# Patient Record
Sex: Female | Born: 1985
Health system: Southern US, Community
[De-identification: ages and names within clinical notes are randomized; demographics above are authoritative.]

## PROBLEM LIST (undated history)

## (undated) DIAGNOSIS — F4310 Post-traumatic stress disorder, unspecified: Secondary | ICD-10-CM

## (undated) DIAGNOSIS — J40 Bronchitis, not specified as acute or chronic: Secondary | ICD-10-CM

## (undated) DIAGNOSIS — F32A Depression, unspecified: Secondary | ICD-10-CM

## (undated) DIAGNOSIS — J45909 Unspecified asthma, uncomplicated: Secondary | ICD-10-CM

## (undated) DIAGNOSIS — D219 Benign neoplasm of connective and other soft tissue, unspecified: Secondary | ICD-10-CM

## (undated) HISTORY — DX: Depression, unspecified: F32.A

## (undated) HISTORY — PX: NO PAST SURGERIES: SHX2092

## (undated) HISTORY — DX: Post-traumatic stress disorder, unspecified: F43.10

---

## 2018-11-09 ENCOUNTER — Other Ambulatory Visit: Payer: Self-pay

## 2018-11-09 ENCOUNTER — Emergency Department (HOSPITAL_COMMUNITY): Payer: Self-pay

## 2018-11-09 ENCOUNTER — Encounter (HOSPITAL_COMMUNITY): Payer: Self-pay

## 2018-11-09 ENCOUNTER — Emergency Department (HOSPITAL_COMMUNITY)
Admission: EM | Admit: 2018-11-09 | Discharge: 2018-11-10 | Disposition: A | Discharge status: Home / Self Care | Payer: Self-pay | Attending: Emergency Medicine | Admitting: Emergency Medicine

## 2018-11-09 DIAGNOSIS — M79662 Pain in left lower leg: Secondary | ICD-10-CM | POA: Insufficient documentation

## 2018-11-09 DIAGNOSIS — M79661 Pain in right lower leg: Secondary | ICD-10-CM | POA: Insufficient documentation

## 2018-11-09 DIAGNOSIS — R609 Edema, unspecified: Secondary | ICD-10-CM | POA: Insufficient documentation

## 2018-11-09 NOTE — ED Provider Notes (Signed)
[facility] DEPT Provider Note   CSN: [id number] Arrival date & time: 11/09/18  2114    History   Chief Complaint Chief Complaint  Patient presents with  . Leg Swelling    HPI Judy Hanna is a 33 y.o. female with a history of uterine fibroids who presents to the emergency department with a chief complaint of bilateral lower extremity swelling that began yesterday.  She reports swelling to her bilateral calves, feet, and ankles since yesterday.  No history of similar.  She reports some aching in her lower legs, but no calf tenderness, pain in the thighs, numbness, weakness, shortness of breath, chest pain, cough, fever, or chills.  No recent illnesses.  She denies recent dietary changes, significant changes in her level of physical activity,  Family history includes thyroid disorder in her grandmother.  She does not take OCPs.  No family history of VTE.     The history is provided by the patient. No language interpreter was used.    History reviewed. No pertinent past medical history.  There are no active problems to display for this patient.   History reviewed. No pertinent surgical history.   OB History   No obstetric history on file.      Home Medications    Prior to Admission medications   Medication Sig Start Date End Date Taking? Authorizing Provider  acetaminophen (TYLENOL) 500 MG tablet Take 1,000 mg by mouth every 6 (six) hours as needed for fever or headache.   Yes [provider]    Family History No family history on file.  Social History Social History   Tobacco Use  . Smoking status: Not on file  Substance Use Topics  . Alcohol use: Not on file  . Drug use: Not on file     Allergies   Patient has no known allergies.   Review of Systems Review of Systems  Constitutional: Negative for activity change, chills and fever.  Respiratory: Negative for cough, shortness of breath and wheezing.    Cardiovascular: Positive for leg swelling. Negative for chest pain and palpitations.  Gastrointestinal: Negative for abdominal pain, diarrhea and vomiting.  Genitourinary: Negative for dysuria.  Musculoskeletal: Negative for arthralgias, back pain and myalgias.  Skin: Negative for rash and wound.  Allergic/Immunologic: Negative for immunocompromised state.  Neurological: Negative for weakness, numbness and headaches.  Psychiatric/Behavioral: Negative for confusion.   Physical Exam Updated Vital Signs BP 117/65 (BP Location: Left Arm)   Pulse 78   Temp 98.3 F (36.8 C) (Oral)   Resp 20   LMP 10/11/2018   SpO2 100%   Physical Exam Vitals signs and nursing note reviewed.  Constitutional:      General: She is not in acute distress.    Appearance: She is obese.  HENT:     Head: Normocephalic.  Eyes:     Conjunctiva/sclera: Conjunctivae normal.  Neck:     Musculoskeletal: Neck supple.  Cardiovascular:     Rate and Rhythm: Normal rate and regular rhythm.     Heart sounds: No murmur. No friction rub. No gallop.   Pulmonary:     Effort: Pulmonary effort is normal. No respiratory distress.     Breath sounds: No stridor. No wheezing, rhonchi or rales.     Comments: Lungs are clear to auscultation bilaterally.  No increased work of breathing. Chest:     Chest wall: No tenderness.  Abdominal:     General: There is no distension.  Palpations: Abdomen is soft. There is no mass.     Tenderness: There is no abdominal tenderness. There is no right CVA tenderness, left CVA tenderness, guarding or rebound.     Hernia: No hernia is present.  Musculoskeletal:     Right lower leg: Edema present.     Left lower leg: Edema present.     Comments: 1+ pitting edema noted to the bilateral ankles and lower legs.  No redness or warmth.  No calf tenderness bilaterally.  Normal exam of the bilateral knees and ankles.  Skin:    General: Skin is warm.     Findings: No rash.  Neurological:      Mental Status: She is alert.  Psychiatric:        Behavior: Behavior normal.      ED Treatments / Results  Labs (all labs ordered are listed, but only abnormal results are displayed) Labs Reviewed  CBC - Abnormal; Notable for the following components:      Result Value   WBC 10.9 (*)    MCV 78.8 (*)    All other components within normal limits  COMPREHENSIVE METABOLIC PANEL - Abnormal; Notable for the following components:   AST 12 (*)    All other components within normal limits  BRAIN NATRIURETIC PEPTIDE  TSH  T4, FREE  T3, FREE  I-STAT BETA HCG BLOOD, ED ([facility], [facility], [facility] ONLY)    EKG None  Radiology Dg Chest 2 View  Result Date: 11/09/2018 CLINICAL DATA:  Leg swelling EXAM: CHEST - 2 VIEW COMPARISON:  None. FINDINGS: The heart size and mediastinal contours are within normal limits. Both lungs are clear. The visualized skeletal structures are unremarkable. IMPRESSION: No active cardiopulmonary disease. Electronically Signed   By: Donavan Foil M.D.   On: 11/09/2018 23:49    Procedures Procedures (including critical care time)  Medications Ordered in ED Medications - No data to display   Initial Impression / Assessment and Plan / ED Course  I have reviewed the triage vital signs and the nursing notes.  Pertinent labs & imaging results that were available during my care of the patient were reviewed by me and considered in my medical decision making (see chart for details).        33 year old female with history of uterine fibroids presenting with bilateral peripheral edema, onset yesterday.  She is hemodynamically stable.  Labs have been reviewed and are unremarkable.  Chest x-ray does not demonstrate cardiomegaly and is otherwise unremarkable.  Doubt secondary to thyroid disease, heart failure, liver disease, pregnancy, fluid overload, or renal disease.  Is possible her symptoms could be secondary to pelvic compression secondary to uterine fibroids, but less likely.   Symptoms could also be related to from menstrual edema.  She is given precautions to increase movement and elevate her extremities.  Recommended PCP follow-up for recheck of her symptoms.  She is hemodynamically stable and in no acute distress.  Safe for discharge to home with outpatient follow-up this time.  Final Clinical Impressions(s) / ED Diagnoses   Final diagnoses:  Peripheral edema    ED Discharge Orders    None       Joanne Gavel, PA-C 11/10/18 0647    Palumbo, April, MD 11/14/18 [id number]

## 2018-11-09 NOTE — ED Triage Notes (Signed)
Pt arrived stating that starting yesterday her feet and ankles began to swell, now her lower legs are beginning to hurt. Denies any shortness of breath.

## 2018-11-09 NOTE — ED Notes (Signed)
Patient transported to X-ray 

## 2018-11-10 LAB — CBC
HCT: 38.2 % (ref 36.0–46.0)
Hemoglobin: 13 g/dL (ref 12.0–15.0)
MCH: 26.8 pg (ref 26.0–34.0)
MCHC: 34 g/dL (ref 30.0–36.0)
MCV: 78.8 fL — ABNORMAL LOW (ref 80.0–100.0)
Platelets: 372 10*3/uL (ref 150–400)
RBC: 4.85 MIL/uL (ref 3.87–5.11)
RDW: 15.1 % (ref 11.5–15.5)
WBC: 10.9 10*3/uL — ABNORMAL HIGH (ref 4.0–10.5)
nRBC: 0 % (ref 0.0–0.2)

## 2018-11-10 LAB — COMPREHENSIVE METABOLIC PANEL
ALT: 12 U/L (ref 0–44)
AST: 12 U/L — ABNORMAL LOW (ref 15–41)
Albumin: 3.9 g/dL (ref 3.5–5.0)
Alkaline Phosphatase: 51 U/L (ref 38–126)
Anion gap: 9 (ref 5–15)
BUN: 11 mg/dL (ref 6–20)
CO2: 26 mmol/L (ref 22–32)
Calcium: 9 mg/dL (ref 8.9–10.3)
Chloride: 104 mmol/L (ref 98–111)
Creatinine, Ser: 0.81 mg/dL (ref 0.44–1.00)
GFR calc Af Amer: >60 mL/min (ref >60)
GFR calc non Af Amer: >60 mL/min (ref >60)
Glucose, Bld: 96 mg/dL (ref 70–99)
Potassium: 3.8 mmol/L (ref 3.5–5.1)
Sodium: 139 mmol/L (ref 135–145)
Total Bilirubin: 0.3 mg/dL (ref 0.3–1.2)
Total Protein: 7.1 g/dL (ref 6.5–8.1)

## 2018-11-10 LAB — BRAIN NATRIURETIC PEPTIDE: B Natriuretic Peptide: 21.7 pg/mL (ref 0.0–100.0)

## 2018-11-10 LAB — T4, FREE: Free T4: 0.84 ng/dL (ref 0.61–1.12)

## 2018-11-10 LAB — TSH: TSH: 2.317 u[IU]/mL (ref 0.350–4.500)

## 2018-11-10 NOTE — Discharge Instructions (Addendum)
Thank you for allowing me to care for you today in the Emergency Department.   Your labs today were reassuring and did not show a specific cause of leg swelling.  Symptoms you can have swelling in your lower legs for no reason.  I have attached additional information on peripheral edema.  Try to elevate your legs when you are sitting and resting so that your toes are at or above the level of your nose.  Try not to sit or stand for long periods of time as this can cause swelling in your lower legs.  Move around frequently.  You can also try wearing compression stockings to see if this will help with your symptoms.  Call the number on your discharge paperwork to get established with a primary care provider for follow-up.  They can also assist you with follow-up for your uterine fibroids.  Return to the emergency department if you have worsening swelling in your legs with shortness of breath and chest pain, if you develop swelling only in 1 of your legs, if you develop sores on your legs that are draining thick, mucus-like material, or other new, concerning symptoms.

## 2018-11-11 LAB — T3, FREE: T3, Free: 2.8 pg/mL (ref 2.0–4.4)

## 2019-06-05 ENCOUNTER — Ambulatory Visit: Payer: Self-pay | Attending: Internal Medicine | Admitting: Internal Medicine

## 2019-06-05 ENCOUNTER — Other Ambulatory Visit: Payer: Self-pay

## 2019-06-05 ENCOUNTER — Encounter: Payer: Self-pay | Admitting: Internal Medicine

## 2019-06-05 VITALS — BP 130/74 | HR 75 | Temp 98.6°F | Resp 16 | Ht 69.0 in | Wt 355.4 lb

## 2019-06-05 DIAGNOSIS — R030 Elevated blood-pressure reading, without diagnosis of hypertension: Secondary | ICD-10-CM

## 2019-06-05 DIAGNOSIS — Z114 Encounter for screening for human immunodeficiency virus [HIV]: Secondary | ICD-10-CM

## 2019-06-05 DIAGNOSIS — G43829 Menstrual migraine, not intractable, without status migrainosus: Secondary | ICD-10-CM | POA: Insufficient documentation

## 2019-06-05 DIAGNOSIS — F17200 Nicotine dependence, unspecified, uncomplicated: Secondary | ICD-10-CM | POA: Insufficient documentation

## 2019-06-05 DIAGNOSIS — Z1331 Encounter for screening for depression: Secondary | ICD-10-CM | POA: Insufficient documentation

## 2019-06-05 DIAGNOSIS — Z8742 Personal history of other diseases of the female genital tract: Secondary | ICD-10-CM | POA: Insufficient documentation

## 2019-06-05 DIAGNOSIS — N946 Dysmenorrhea, unspecified: Secondary | ICD-10-CM | POA: Insufficient documentation

## 2019-06-05 MED ORDER — IBUPROFEN 600 MG PO TABS: 600.0000 mg | ORAL_TABLET | Freq: Three times a day (TID) | ORAL | 1 refills | Status: DC | PRN

## 2019-06-05 MED FILL — IBUPROFEN 600 MG TABLET: 600 | 13 days supply | Qty: 40 | Fill #0

## 2019-06-05 NOTE — Progress Notes (Signed)
Pt states she has urinary fibroids

## 2019-06-05 NOTE — Progress Notes (Signed)
Patient ID: Judy Hanna, female    DOB: 03/22/1986  MRN: [id number]  CC: New Patient (Initial Visit)   Subjective: Judy Hanna is a 34 y.o. female who presents for new pt visit Her concerns today include:   No previous PCP in [place]. Moved from [place] 2 yrs ago. That's where her previous PCP was. No chronic med condition except uterine fibroids. Dx 11 yrs ago. Feels her fibroids are causing issues.  Menstrual cycles and painful.  She gets cramps in abdomen, legs and lower back and HA which she feels are migraines. HA starts a day or 2 before menses.  Usually frontal and intense.  No N/V, + photophobia.  If she bends to pick up something or turn the wrong way she gets a knot feeling in her abdomen Menses regular and  last 4-5 days with moderate bleeding with some lots.  Last cycle was heavier than usual.  LNMP 2/4-11/2019. Last pap was about 1.5 yr ago in [place].  It was abn and had biopsy.  Biopsy/colp was nl but told to have repeat in 1 yr. she is want to hold off on getting the Pap smear done today as she is on the tail end of her menses. -in same sex relationship.  She was trying to conceive a few mths with a donor whom she had sex with.  She did not conceive.   No vaginal dischg or itching -No fhx of uterine/cervical ca.  +FHx of breast CA in great aunt.    Tob dep:  Smoke 1/2 pk/day for 3 yrs.  "I have anxiety sometimes and it just relaxes me."  Would like to quit.  Smoke more when she is at home.  Does not think about smoking while at work.    Obesity:  "It's a concern." Feels she is an emotional/comfort eater.   Wants to do something about getting her weight down.    Drinks sodas, diet green teas, juices.   Does not exercise  Past medical, social, family histories reviewed and updated  Current Outpatient Medications on File Prior to Visit  Medication Sig Dispense Refill  . acetaminophen (TYLENOL) 500 MG tablet Take 1,000 mg by mouth every 6 (six) hours as needed  for fever or headache.     No current facility-administered medications on file prior to visit.    No Known Allergies  Social History   Socioeconomic History  . Marital status: Single    Spouse name: Not on file  . Number of children: 0  . Years of education: Not on file  . Highest education level: Some college, no degree  Occupational History  . Occupation: home health care  Tobacco Use  . Smoking status: Current Every Day Smoker    Packs/day: 0.50    Years: 3.00    Pack years: 1.50    Types: Cigarettes  . Smokeless tobacco: Never Used  Substance and Sexual Activity  . Alcohol use: Yes    Comment: occisonally   . Drug use: Not Currently    Types: Marijuana  . Sexual activity: Yes    Partners: Female  Other Topics Concern  . Not on file  Social History Narrative  . Not on file   Social Determinants of Health   Financial Resource Strain:   . Difficulty of Paying Living Expenses: Not on file  Food Insecurity:   . Worried About Charity fundraiser in the Last Year: Not on file  . Ran Out of Food  in the Last Year: Not on file  Transportation Needs:   . Lack of Transportation (Medical): Not on file  . Lack of Transportation (Non-Medical): Not on file  Physical Activity:   . Days of Exercise per Week: Not on file  . Minutes of Exercise per Session: Not on file  Stress:   . Feeling of Stress : Not on file  Social Connections:   . Frequency of Communication with Friends and Family: Not on file  . Frequency of Social Gatherings with Friends and Family: Not on file  . Attends Religious Services: Not on file  . Active Member of Clubs or Organizations: Not on file  . Attends Archivist Meetings: Not on file  . Marital Status: Not on file  Intimate Partner Violence:   . Fear of Current or Ex-Partner: Not on file  . Emotionally Abused: Not on file  . Physically Abused: Not on file  . Sexually Abused: Not on file    Family History  Problem Relation Age of  Onset  . Hypertension Mother   . Hypertension Sister   . Hypertension Maternal Grandmother     History reviewed. No pertinent surgical history.  ROS: Review of Systems Negative except as stated above  PHYSICAL EXAM: BP 130/74   Pulse 75   Temp 98.6 F (37 C)   Resp 16   Ht 5\' 9"  (1.753 m)   Wt (!) 355 lb 6.4 oz (161.2 kg)   LMP 06/01/2019   SpO2 100%   BMI 52.48 kg/m   Physical Exam  General appearance - alert, well appearing, young obese African-American female and in no distress Mental status - normal mood, behavior, speech, dress, motor activity, and thought processes Eyes - pupils equal and reactive, extraocular eye movements intact Nose - normal and patent, no erythema, discharge or polyps Mouth - mucous membranes moist, pharynx normal without lesions Neck - supple, no significant adenopathy Chest - clear to auscultation, no wheezes, rales or rhonchi, symmetric air entry Heart - normal rate, regular rhythm, normal S1, S2, no murmurs, rubs, clicks or gallops Extremities -legs and ankles are large with nonpitting edema Depression screen PHQ 2/9 06/05/2019  Down, Depressed, Hopeless 1  PHQ - 2 Score 1  Altered sleeping 2  Tired, decreased energy 2  Change in appetite 1  Feeling bad or failure about yourself  2  Trouble concentrating 1  Moving slowly or fidgety/restless 0  Suicidal thoughts 0  PHQ-9 Score 9    CMP Latest Ref Rng & Units 11/10/2018  Glucose 70 - 99 mg/dL 96  BUN 6 - 20 mg/dL 11  Creatinine 0.44 - 1.00 mg/dL 0.81  Sodium 135 - 145 mmol/L 139  Potassium 3.5 - 5.1 mmol/L 3.8  Chloride 98 - 111 mmol/L 104  CO2 22 - 32 mmol/L 26  Calcium 8.9 - 10.3 mg/dL 9.0  Total Protein 6.5 - 8.1 g/dL 7.1  Total Bilirubin 0.3 - 1.2 mg/dL 0.3  Alkaline Phos 38 - 126 U/L 51  AST 15 - 41 U/L 12(L)  ALT 0 - 44 U/L 12   Lipid Panel  No results found for: CHOL, TRIG, HDL, CHOLHDL, VLDL, LDLCALC, LDLDIRECT  CBC    Component Value Date/Time   WBC 10.9 (H)  11/10/2018 0004   RBC 4.85 11/10/2018 0004   HGB 13.0 11/10/2018 0004   HCT 38.2 11/10/2018 0004   PLT 372 11/10/2018 0004   MCV 78.8 (L) 11/10/2018 0004   MCH 26.8 11/10/2018 0004   MCHC  34.0 11/10/2018 0004   RDW 15.1 11/10/2018 0004    ASSESSMENT AND PLAN: 1. History of abnormal cervical Pap smear -Patient currently on menses.  Plan to bring her back in 6 weeks to do her Pap smear  2. Dysmenorrhea With known history of fibroids.  Patient wanting to see gynecology but she is currently uninsured Recommend taking ibuprofen as needed - CBC - ibuprofen (ADVIL) 600 MG tablet; Take 1 tablet (600 mg total) by mouth every 8 (eight) hours as needed.  Dispense: 40 tablet; Refill: 1  3. Menstrual migraine without status migrainosus, not intractable - ibuprofen (ADVIL) 600 MG tablet; Take 1 tablet (600 mg total) by mouth every 8 (eight) hours as needed.  Dispense: 40 tablet; Refill: 1  4. Tobacco dependence Advised to quit.  Discussed health risks associated with smoking.  Patient wanting to quit.  We discussed ways to help her quit including use of nicotine patches, Chantix or Wellbutrin.  Patient states she feels she can quit on her own.  I have encouraged her to make a plan and set a quit date.  Less than 5 minutes spent on counseling  5. Morbid obesity ([place]) Discussed and given some tips on healthy eating habits including eliminating sugary drinks from the diet, eating more lean white meat than red meat, cutting back on white carbohydrates and incorporating fresh fruits and vegetables into the diet. Encourage her to get in some form of moderate intensity exercise for total of at least 150 minutes/week - Comprehensive metabolic panel - Lipid panel - Hemoglobin A1c  6. Elevated blood pressure reading Borderline.  DASH diet discussed and encouraged.  We will recheck of blood pressure on follow-up visit  7. Positive depression screening Discuss further on follow-up visit  8. Screening  for HIV (human immunodeficiency virus) - HIV Antibody (routine testing w rflx)    Patient was given the opportunity to ask questions.  Patient verbalized understanding of the plan and was able to repeat key elements of the plan.   Orders Placed This Encounter  Procedures  . CBC  . Comprehensive metabolic panel  . Lipid panel  . Hemoglobin A1c  . HIV Antibody (routine testing w rflx)  . Ambulatory referral to Gynecology     Requested Prescriptions   Signed Prescriptions Disp Refills  . ibuprofen (ADVIL) 600 MG tablet 40 tablet 1    Sig: Take 1 tablet (600 mg total) by mouth every 8 (eight) hours as needed.    Return in about 6 weeks (around 07/17/2019) for PAP.  Karle Plumber, MD, FACP

## 2019-06-05 NOTE — Patient Instructions (Addendum)
Obesity, Adult Obesity is having too much body fat. Being obese means that your weight is more than what is healthy for you. BMI is a number that explains how much body fat you have. If you have a BMI of 30 or more, you are obese. Obesity is often caused by eating or drinking more calories than your body uses. Changing your lifestyle can help you lose weight. Obesity can cause serious health problems, such as:  Stroke.  Coronary artery disease (CAD).  Type 2 diabetes.  Some types of cancer, including cancers of the colon, breast, uterus, and gallbladder.  Osteoarthritis.  High blood pressure (hypertension).  High cholesterol.  Sleep apnea.  Gallbladder stones.  Infertility problems. What are the causes?  Eating meals each day that are high in calories, sugar, and fat.  Being born with genes that may make you more likely to become obese.  Having a medical condition that causes obesity.  Taking certain medicines.  Sitting a lot (having a sedentary lifestyle).  Not getting enough sleep.  Drinking a lot of drinks that have sugar in them. What increases the risk?  Having a family history of obesity.  Being an African American woman.  Being a Hispanic man.  Living in an area with limited access to: ? Parks, recreation centers, or sidewalks. ? Healthy food choices, such as grocery stores and farmers' markets. What are the signs or symptoms? The main sign is having too much body fat. How is this treated?  Treatment for this condition often includes changing your lifestyle. Treatment may include: ? Changing your diet. This may include making a healthy meal plan. ? Exercise. This may include activity that causes your heart to beat faster (aerobic exercise) and strength training. Work with your doctor to design a program that works for you. ? Medicine to help you lose weight. This may be used if you are not able to lose 1 pound a week after 6 weeks of healthy eating and  more exercise. ? Treating conditions that cause the obesity. ? Surgery. Options may include gastric banding and gastric bypass. This may be done if:  Other treatments have not helped to improve your condition.  You have a BMI of 40 or higher.  You have life-threatening health problems related to obesity. Follow these instructions at home: Eating and drinking   Follow advice from your doctor about what to eat and drink. Your doctor may tell you to: ? Limit fast food, sweets, and processed snack foods. ? Choose low-fat options. For example, choose low-fat milk instead of whole milk. ? Eat 5 or more servings of fruits or vegetables each day. ? Eat at home more often. This gives you more control over what you eat. ? Choose healthy foods when you eat out. ? Learn to read food labels. This will help you learn how much food is in 1 serving. ? Keep low-fat snacks available. ? Avoid drinks that have a lot of sugar in them. These include soda, fruit juice, iced tea with sugar, and flavored milk.  Drink enough water to keep your pee (urine) pale yellow.  Do not go on fad diets. Physical activity  Exercise often, as told by your doctor. Most adults should get up to 150 minutes of moderate-intensity exercise every week.Ask your doctor: ? What types of exercise are safe for you. ? How often you should exercise.  Warm up and stretch before being active.  Do slow stretching after being active (cool down).  Rest between   times of being active. Lifestyle  Work with your doctor and a food expert (dietitian) to set a weight-loss goal that is best for you.  Limit your screen time.  Find ways to reward yourself that do not involve food.  Do not drink alcohol if: ? Your doctor tells you not to drink. ? You are pregnant, may be pregnant, or are planning to become pregnant.  If you drink alcohol: ? Limit how much you use to:  0-1 drink a day for women.  0-2 drinks a day for men. ? Be  aware of how much alcohol is in your drink. In the [place] one drink equals one 12 oz bottle of beer (355 mL), one 5 oz glass of wine (148 mL), or one 1 oz glass of hard liquor (44 mL). General instructions  Keep a weight-loss journal. This can help you keep track of: ? The food that you eat. ? How much exercise you get.  Take over-the-counter and prescription medicines only as told by your doctor.  Take vitamins and supplements only as told by your doctor.  Think about joining a support group.  Keep all follow-up visits as told by your doctor. This is important. Contact a doctor if:  You cannot meet your weight loss goal after you have changed your diet and lifestyle for 6 weeks. Get help right away if you:  Are having trouble breathing.  Are having thoughts of harming yourself. Summary  Obesity is having too much body fat.  Being obese means that your weight is more than what is healthy for you.  Work with your doctor to set a weight-loss goal.  Get regular exercise as told by your doctor. This information is not intended to replace advice given to you by your health care provider. Make sure you discuss any questions you have with your health care provider. Document Revised: 12/16/2017 Document Reviewed: 12/16/2017 Elsevier Patient Education  [place].   Smoking Tobacco Information, Adult Smoking tobacco can be harmful to your health. Tobacco contains a poisonous (toxic), colorless chemical called nicotine. Nicotine is addictive. It changes the brain and can make it hard to stop smoking. Tobacco also has other toxic chemicals that can hurt your body and raise your risk of many cancers. How can smoking tobacco affect me? Smoking tobacco puts you at risk for:  Cancer. Smoking is most commonly associated with lung cancer, but can also lead to cancer in other parts of the body.  Chronic obstructive pulmonary disease (COPD). This is a long-term lung condition that  makes it hard to breathe. It also gets worse over time.  High blood pressure (hypertension), heart disease, stroke, or heart attack.  Lung infections, such as pneumonia.  Cataracts. This is when the lenses in the eyes become clouded.  Digestive problems. This may include peptic ulcers, heartburn, and gastroesophageal reflux disease (GERD).  Oral health problems, such as gum disease and tooth loss.  Loss of taste and smell. Smoking can affect your appearance by causing:  Wrinkles.  Yellow or stained teeth, fingers, and fingernails. Smoking tobacco can also affect your social life, because:  It may be challenging to find places to smoke when away from home. Many workplaces, [employer], hotels, and public places are tobacco-free.  Smoking is expensive. This is due to the cost of tobacco and the long-term costs of treating health problems from smoking.  Secondhand smoke may affect those around you. Secondhand smoke can cause lung cancer, breathing problems, and heart disease. Children  of smokers have a higher risk for: ? Sudden infant death syndrome (SIDS). ? Ear infections. ? Lung infections. If you currently smoke tobacco, quitting now can help you:  Lead a longer and healthier life.  Look, smell, breathe, and feel better over time.  Save money.  Protect others from the harms of secondhand smoke. What actions can I take to prevent health problems? Quit smoking   Do not start smoking. Quit if you already do.  Make a plan to quit smoking and commit to it. Look for programs to help you and ask your health care provider for recommendations and ideas.  Set a date and write down all the reasons you want to quit.  Let your friends and family know you are quitting so they can help and support you. Consider finding friends who also want to quit. It can be easier to quit with someone else, so that you can support each other.  Talk with your health care provider about using  nicotine replacement medicines to help you quit, such as gum, lozenges, patches, sprays, or pills.  Do not replace cigarette smoking with electronic cigarettes, which are commonly called e-cigarettes. The safety of e-cigarettes is not known, and some may contain harmful chemicals.  If you try to quit but return to smoking, stay positive. It is common to slip up when you first quit, so take it one day at a time.  Be prepared for cravings. When you feel the urge to smoke, chew gum or suck on hard candy. Lifestyle  Stay busy and take care of your body.  Drink enough fluid to keep your urine pale yellow.  Get plenty of exercise and eat a healthy diet. This can help prevent weight gain after quitting.  Monitor your eating habits. Quitting smoking can cause you to have a larger appetite than when you smoke.  Find ways to relax. Go out with friends or family to a movie or a restaurant where people do not smoke.  Ask your health care provider about having regular tests (screenings) to check for cancer. This may include blood tests, imaging tests, and other tests.  Find ways to manage your stress, such as meditation, yoga, or exercise. Where to find support To get support to quit smoking, consider:  Asking your health care provider for more information and resources.  Taking classes to learn more about quitting smoking.  Looking for local organizations that offer resources about quitting smoking.  Joining a support group for people who want to quit smoking in your local community.  Calling the smokefree.gov counselor helpline: 1-800-Quit-Now 3076739495) Where to find more information You may find more information about quitting smoking from:  HelpGuide.org: www.helpguide.org  https://hall.com/: smokefree.gov  American Lung Association: www.lung.org Contact a health care provider if you:  Have problems breathing.  Notice that your lips, nose, or fingers turn blue.  Have chest  pain.  Are coughing up blood.  Feel faint or you pass out.  Have other health changes that cause you to worry. Summary  Smoking tobacco can negatively affect your health, the health of those around you, your finances, and your social life.  Do not start smoking. Quit if you already do. If you need help quitting, ask your health care provider.  Think about joining a support group for people who want to quit smoking in your local community. There are many effective programs that will help you to quit this behavior. This information is not intended to replace advice given to  you by your health care provider. Make sure you discuss any questions you have with your health care provider. Document Revised: 01/06/2019 Document Reviewed: 04/28/2016 Elsevier Patient Education  2020 [employer].

## 2019-06-06 ENCOUNTER — Telehealth: Payer: Self-pay

## 2019-06-06 LAB — COMPREHENSIVE METABOLIC PANEL
ALT: 8 IU/L (ref 0–32)
AST: 10 IU/L (ref 0–40)
Albumin/Globulin Ratio: 1.9 (ref 1.2–2.2)
Albumin: 4.3 g/dL (ref 3.8–4.8)
Alkaline Phosphatase: 63 IU/L (ref 39–117)
BUN/Creatinine Ratio: 13 (ref 9–23)
BUN: 10 mg/dL (ref 6–20)
Bilirubin Total: 0.4 mg/dL (ref 0.0–1.2)
CO2: 22 mmol/L (ref 20–29)
Calcium: 9.2 mg/dL (ref 8.7–10.2)
Chloride: 107 mmol/L — ABNORMAL HIGH (ref 96–106)
Creatinine, Ser: 0.75 mg/dL (ref 0.57–1.00)
GFR calc Af Amer: 122 mL/min/{1.73_m2} (ref >59)
GFR calc non Af Amer: 106 mL/min/{1.73_m2} (ref >59)
Globulin, Total: 2.3 g/dL (ref 1.5–4.5)
Glucose: 89 mg/dL (ref 65–99)
Potassium: 4.4 mmol/L (ref 3.5–5.2)
Sodium: 142 mmol/L (ref 134–144)
Total Protein: 6.6 g/dL (ref 6.0–8.5)

## 2019-06-06 LAB — LIPID PANEL
Chol/HDL Ratio: 3.2 ratio (ref 0.0–4.4)
Cholesterol, Total: 185 mg/dL (ref 100–199)
HDL: 58 mg/dL (ref >39)
LDL Chol Calc (NIH): 115 mg/dL — ABNORMAL HIGH (ref 0–99)
Triglycerides: 63 mg/dL (ref 0–149)
VLDL Cholesterol Cal: 12 mg/dL (ref 5–40)

## 2019-06-06 LAB — HEMOGLOBIN A1C
Est. average glucose Bld gHb Est-mCnc: 114 mg/dL
Hgb A1c MFr Bld: 5.6 % (ref 4.8–5.6)

## 2019-06-06 LAB — CBC
Hematocrit: 39.5 % (ref 34.0–46.6)
Hemoglobin: 13.5 g/dL (ref 11.1–15.9)
MCH: 27.1 pg (ref 26.6–33.0)
MCHC: 34.2 g/dL (ref 31.5–35.7)
MCV: 79 fL (ref 79–97)
Platelets: 374 10*3/uL (ref 150–450)
RBC: 4.99 x10E6/uL (ref 3.77–5.28)
RDW: 15 % (ref 11.7–15.4)
WBC: 8.2 10*3/uL (ref 3.4–10.8)

## 2019-06-06 LAB — HIV ANTIBODY (ROUTINE TESTING W REFLEX): HIV Screen 4th Generation wRfx: NONREACTIVE

## 2019-06-06 NOTE — Telephone Encounter (Signed)
Contacted pt to go over lab results was unable to reach pt due to call can't be completed at this time

## 2019-06-12 ENCOUNTER — Telehealth: Payer: Self-pay

## 2019-06-12 NOTE — Telephone Encounter (Signed)
DOB and Name verified As instructed by PCP made pt aware of above results and results note. Verbalized understanding  "Let patient know that her blood count is normal meaning no anemia. Blood sugar, kidney and liver function tests normal. LDL cholesterol is 115 with goal being less than 100. Healthy eating habits and regular exercise will help to lower the cholesterol. HIV test negative".

## 2019-07-20 ENCOUNTER — Ambulatory Visit: Payer: Self-pay | Admitting: Internal Medicine

## 2019-08-18 ENCOUNTER — Ambulatory Visit: Payer: Self-pay | Admitting: Internal Medicine

## 2019-09-04 ENCOUNTER — Encounter: Payer: Self-pay | Admitting: Internal Medicine

## 2019-09-04 ENCOUNTER — Ambulatory Visit: Payer: Self-pay | Attending: Internal Medicine | Admitting: Internal Medicine

## 2019-09-04 ENCOUNTER — Other Ambulatory Visit: Payer: Self-pay

## 2019-09-04 VITALS — BP 133/78 | HR 57 | Temp 97.3°F | Resp 16 | Wt 360.6 lb

## 2019-09-04 DIAGNOSIS — N946 Dysmenorrhea, unspecified: Secondary | ICD-10-CM

## 2019-09-04 DIAGNOSIS — G43829 Menstrual migraine, not intractable, without status migrainosus: Secondary | ICD-10-CM

## 2019-09-04 DIAGNOSIS — N750 Cyst of Bartholin's gland: Secondary | ICD-10-CM

## 2019-09-04 DIAGNOSIS — F17200 Nicotine dependence, unspecified, uncomplicated: Secondary | ICD-10-CM

## 2019-09-04 DIAGNOSIS — D219 Benign neoplasm of connective and other soft tissue, unspecified: Secondary | ICD-10-CM

## 2019-09-04 DIAGNOSIS — Z124 Encounter for screening for malignant neoplasm of cervix: Secondary | ICD-10-CM

## 2019-09-04 MED ORDER — SUMATRIPTAN SUCCINATE 50 MG PO TABS: ORAL_TABLET | ORAL | 1 refills | Status: DC

## 2019-09-04 NOTE — Progress Notes (Signed)
Patient ID: Judy Hanna, female    DOB: Oct 28, 1985  MRN: [id number]  CC: Gynecologic Exam   Subjective: Judy Hanna is a 34 y.o. female who presents for GYN exam/pap smear. Her concerns today include:  tob dep, obesity, fibroids, menstrual migraines, morbid obesity  Has a boil on both sides of labia x 1 mth. Both are sore and some increase in size Hx of fibroids.  Last menses started 08/28/2019. Usually last 4-5 days. Hx of fibroids.  Menses are heavy and painful with cramps in legs and abdomen and migraines. Ibuprofen upsets her stomach so she takes regular Tylenol and tries to relax.  Last pap was about 1.5 yr ago in [place].  It was abn and had biopsy.  Biopsy/colp was nl but told to have repeat in 1 yr.  -in same sex relationship.  She was trying to conceive a few mths with a donor whom she had sex with.  She did not conceive.   No vaginal dischg or itching -No fhx of uterine/cervical ca.  +FHx of breast CA in great aunt.    Obesity:  Gained 5 lbs since last visit.  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  States she only eats about once a day but she over eats. Does not snack much. Tries to walk some days  Elev BP:  Has device but has not checked it recently. Tries to limit salt in foods  Tob dep:  Not smoking as much as she use to.  Amount that she smokes depends on stress level.  Patient Active Problem List   Diagnosis Date Noted  . Positive depression screening 06/05/2019  . Morbid obesity ([place]) 06/05/2019  . Tobacco dependence 06/05/2019  . Menstrual migraine without status migrainosus, not intractable 06/05/2019  . Dysmenorrhea 06/05/2019  . History of abnormal cervical Pap smear 06/05/2019     Current Outpatient Medications on File Prior to Visit  Medication Sig Dispense Refill  . acetaminophen (TYLENOL) 500 MG tablet Take 1,000 mg by mouth every 6 (six) hours as needed for fever or headache.    . ibuprofen (ADVIL) 600 MG tablet Take 1 tablet (600 mg total) by  mouth every 8 (eight) hours as needed. (Patient not taking: Reported on 09/04/2019) 40 tablet 1   No current facility-administered medications on file prior to visit.    No Known Allergies  Social History   Socioeconomic History  . Marital status: Single    Spouse name: Not on file  . Number of children: 0  . Years of education: Not on file  . Highest education level: Some college, no degree  Occupational History  . Occupation: home health care  Tobacco Use  . Smoking status: Current Every Day Smoker    Packs/day: 0.50    Years: 3.00    Pack years: 1.50    Types: Cigarettes  . Smokeless tobacco: Never Used  Substance and Sexual Activity  . Alcohol use: Yes    Comment: occisonally   . Drug use: Not Currently    Types: Marijuana  . Sexual activity: Yes    Partners: Female  Other Topics Concern  . Not on file  Social History Narrative  . Not on file   Social Determinants of Health   Financial Resource Strain:   . Difficulty of Paying Living Expenses:   Food Insecurity:   . Worried About Charity fundraiser in the Last Year:   . Arboriculturist in the Last Year:   [employer]  Needs:   . Lack of Transportation (Medical):   Marland Kitchen Lack of Transportation (Non-Medical):   Physical Activity:   . Days of Exercise per Week:   . Minutes of Exercise per Session:   Stress:   . Feeling of Stress :   Social Connections:   . Frequency of Communication with Friends and Family:   . Frequency of Social Gatherings with Friends and Family:   . Attends Religious Services:   . Active Member of Clubs or Organizations:   . Attends Archivist Meetings:   Marland Kitchen Marital Status:   Intimate Partner Violence:   . Fear of Current or Ex-Partner:   . Emotionally Abused:   Marland Kitchen Physically Abused:   . Sexually Abused:     Family History  Problem Relation Age of Onset  . Hypertension Mother   . Hypertension Sister   . Hypertension Maternal Grandmother     No past surgical history on  file.  ROS: Review of Systems Negative except as stated above  PHYSICAL EXAM: BP 133/78   Pulse (!) 57   Temp (!) 97.3 F (36.3 C)   Resp 16   Wt (!) 360 lb 9.6 oz (163.6 kg)   SpO2 99%   BMI 53.25 kg/m   Wt Readings from Last 3 Encounters:  09/04/19 (!) 360 lb 9.6 oz (163.6 kg)  06/05/19 (!) 355 lb 6.4 oz (161.2 kg)    Physical Exam General appearance - alert, well appearing, morbidly obese female and in no distress Mental status - normal mood, behavior, speech, dress, motor activity, and thought processes Breasts - breasts appear normal, no suspicious masses, no skin or nipple changes or axillary nodes Pelvic - 1.5 cm non-tender barthlin cyst LT lower labia.  Smaller one RT upper labia. No abnormal dischg noted in vaginal vault.  Cervix grossly nl. No CMT or adnexal masses.  Uterus not well palpated due to large body habitus.   CMP Latest Ref Rng & Units 06/05/2019 11/10/2018  Glucose 65 - 99 mg/dL 89 96  BUN 6 - 20 mg/dL 10 11  Creatinine 0.57 - 1.00 mg/dL 0.75 0.81  Sodium 134 - 144 mmol/L 142 139  Potassium 3.5 - 5.2 mmol/L 4.4 3.8  Chloride 96 - 106 mmol/L 107(H) 104  CO2 20 - 29 mmol/L 22 26  Calcium 8.7 - 10.2 mg/dL 9.2 9.0  Total Protein 6.0 - 8.5 g/dL 6.6 7.1  Total Bilirubin 0.0 - 1.2 mg/dL 0.4 0.3  Alkaline Phos 39 - 117 IU/L 63 51  AST 0 - 40 IU/L 10 12(L)  ALT 0 - 32 IU/L 8 12   Lipid Panel     Component Value Date/Time   CHOL 185 06/05/2019 1131   TRIG 63 06/05/2019 1131   HDL 58 06/05/2019 1131   CHOLHDL 3.2 06/05/2019 1131   LDLCALC 115 (H) 06/05/2019 1131    CBC    Component Value Date/Time   WBC 8.2 06/05/2019 1131   WBC 10.9 (H) 11/10/2018 0004   RBC 4.99 06/05/2019 1131   RBC 4.85 11/10/2018 0004   HGB 13.5 06/05/2019 1131   HCT 39.5 06/05/2019 1131   PLT 374 06/05/2019 1131   MCV 79 06/05/2019 1131   MCH 27.1 06/05/2019 1131   MCH 26.8 11/10/2018 0004   MCHC 34.2 06/05/2019 1131   MCHC 34.0 11/10/2018 0004   RDW 15.0 06/05/2019  1131    ASSESSMENT AND PLAN:  1. Pap smear for cervical cancer screening - Cervicovaginal ancillary only - Cytology -  PAP  2. Dysmenorrhea 3. Fibroids 4. Bartholin cyst -not able to tolerate Ibuprofen.  Recommend Tylenol 500 mg PRN OTC and use of heating pad.  Try warm compresses to cyst. Be seen in ER for lancing if becomes swollen, warm and painful.  Will refer to GYN and get pelvic US once she has [facility] discount  5. Menstrual migraine without status migrainosus, not intractable Imitrex to use PRN perimenses - SUMAtriptan (IMITREX) 50 MG tablet; Take 1 tablet at the start of the headache.  May repeat in 2 hours if headache does not resolve.  Max 2 hours / 24-hour.  Dispense: 10 tablet; Refill: 1  6. Morbid obesity ([place]) -advise to avoid skipping meals; will help prevent over eating at subsequent meals Encourage her to move more  7. Tobacco dependence Continue to encourage toward smoking cessation    Patient was given the opportunity to ask questions.  Patient verbalized understanding of the plan and was able to repeat key elements of the plan.   No orders of the defined types were placed in this encounter.    Requested Prescriptions   Signed Prescriptions Disp Refills  . SUMAtriptan (IMITREX) 50 MG tablet 10 tablet 1    Sig: Take 1 tablet at the start of the headache.  May repeat in 2 hours if headache does not resolve.  Max 2 hours / 24-hour.    Return in about 3 months (around 12/05/2019).  Karle Plumber, MD, FACP

## 2019-09-04 NOTE — Patient Instructions (Addendum)
You should apply for the [facility] card/[facility] discount card.  Once you have been approved you can let me know so that I can refer you to the gynecologist and order the pelvic ultrasound to evaluate the fibroids.

## 2019-09-05 LAB — CERVICOVAGINAL ANCILLARY ONLY
Bacterial Vaginitis (gardnerella): POSITIVE — AB
Candida Glabrata: NEGATIVE
Candida Vaginitis: NEGATIVE
Chlamydia: NEGATIVE
Comment: NEGATIVE
Comment: NEGATIVE
Comment: NEGATIVE
Comment: NEGATIVE
Comment: NEGATIVE
Comment: NORMAL
Neisseria Gonorrhea: NEGATIVE
Trichomonas: NEGATIVE

## 2019-09-06 ENCOUNTER — Telehealth (INDEPENDENT_AMBULATORY_CARE_PROVIDER_SITE_OTHER): Payer: Self-pay

## 2019-09-06 ENCOUNTER — Other Ambulatory Visit: Payer: Self-pay | Admitting: Internal Medicine

## 2019-09-06 LAB — CYTOLOGY - PAP
Comment: NEGATIVE
Diagnosis: NEGATIVE
Diagnosis: REACTIVE
High risk HPV: NEGATIVE

## 2019-09-06 MED ORDER — METRONIDAZOLE 500 MG PO TABS: 500.0000 mg | ORAL_TABLET | Freq: Two times a day (BID) | ORAL | 0 refills | Status: DC

## 2019-09-06 NOTE — Telephone Encounter (Signed)
Contacted pt to go over lab results was unable to reach pt due to call can't be completed at this time

## 2019-09-18 ENCOUNTER — Telehealth: Payer: Self-pay | Admitting: Internal Medicine

## 2019-09-18 NOTE — Telephone Encounter (Signed)
Pt called for results and plan for f/u. Pt req ret call to [phone].

## 2019-09-18 NOTE — Telephone Encounter (Signed)
Patient called requesting lab results. Bien, CMA gave the results to the patient.

## 2019-09-19 MED FILL — metroNIDAZOLE 500 MG TABS: 500 | 7 days supply | Qty: 14 | Fill #0

## 2019-09-19 NOTE — Telephone Encounter (Signed)
Returned pt call and went over pap results pt doesn't have any questions or concerns

## 2019-09-22 MED FILL — SUMATRIPTAN SUCC 50 MG TAB: 50 | 23 days supply | Qty: 9 | Fill #0

## 2019-09-30 ENCOUNTER — Encounter (HOSPITAL_COMMUNITY): Payer: Self-pay | Admitting: Emergency Medicine

## 2019-09-30 ENCOUNTER — Emergency Department (HOSPITAL_COMMUNITY): Payer: Self-pay

## 2019-09-30 ENCOUNTER — Emergency Department (HOSPITAL_COMMUNITY)
Admission: EM | Admit: 2019-09-30 | Discharge: 2019-09-30 | Disposition: A | Discharge status: Home / Self Care | Payer: Self-pay | Attending: Emergency Medicine | Admitting: Emergency Medicine

## 2019-09-30 DIAGNOSIS — R062 Wheezing: Secondary | ICD-10-CM | POA: Insufficient documentation

## 2019-09-30 DIAGNOSIS — F17210 Nicotine dependence, cigarettes, uncomplicated: Secondary | ICD-10-CM | POA: Insufficient documentation

## 2019-09-30 DIAGNOSIS — R0602 Shortness of breath: Secondary | ICD-10-CM | POA: Insufficient documentation

## 2019-09-30 DIAGNOSIS — J209 Acute bronchitis, unspecified: Secondary | ICD-10-CM | POA: Insufficient documentation

## 2019-09-30 MED ORDER — ALBUTEROL SULFATE HFA 108 (90 BASE) MCG/ACT IN AERS
2.0000 | INHALATION_SPRAY | Freq: Four times a day (QID) | RESPIRATORY_TRACT | Status: DC
  Administered 2019-09-30: 2 via RESPIRATORY_TRACT
  Filled 2019-09-30: qty 6.7

## 2019-09-30 MED ORDER — AZITHROMYCIN 250 MG PO TABS
500.0000 mg | ORAL_TABLET | Freq: Once | ORAL | Status: AC
  Administered 2019-09-30: 500 mg via ORAL
  Filled 2019-09-30: qty 2

## 2019-09-30 MED ORDER — PREDNISONE 20 MG PO TABS
60.0000 mg | ORAL_TABLET | ORAL | Status: AC
  Administered 2019-09-30: 60 mg via ORAL
  Filled 2019-09-30: qty 3

## 2019-09-30 MED ORDER — PREDNISONE 20 MG PO TABS: 40.0000 mg | ORAL_TABLET | Freq: Every day | ORAL | 0 refills | Status: DC

## 2019-09-30 MED ORDER — AZITHROMYCIN 250 MG PO TABS: 250.0000 mg | ORAL_TABLET | Freq: Every day | ORAL | 0 refills | Status: AC

## 2019-09-30 NOTE — ED Provider Notes (Signed)
[facility] Provider Note   CSN: [id number] Arrival date & time: 09/30/19  1228     History Chief Complaint  Patient presents with  . Wheezing  . Sore Throat  . Back Pain    Judy Hanna is a 34 y.o. female.  HPI    Patient presents with 3 days of cough, wheezing, shortness of breath. Patient has some pain with coughing, but otherwise no chest pain. She has no fever Onset was without clear precipitant. Since onset she has had some relief borrowing a friend's albuterol inhaler. She does not have a known history of asthma, but does note that she is a smoker, and has recently been smoking more than she was previously. She denies other medical issues, states that she was well prior to onset of symptoms. Now, with concern for the above she presents for evaluation. History reviewed. No pertinent past medical history.  Patient Active Problem List   Diagnosis Date Noted  . Positive depression screening 06/05/2019  . Morbid obesity ([place]) 06/05/2019  . Tobacco dependence 06/05/2019  . Menstrual migraine without status migrainosus, not intractable 06/05/2019  . Dysmenorrhea 06/05/2019  . History of abnormal cervical Pap smear 06/05/2019    History reviewed. No pertinent surgical history.   OB History   No obstetric history on file.     Family History  Problem Relation Age of Onset  . Hypertension Mother   . Hypertension Sister   . Hypertension Maternal Grandmother     Social History   Tobacco Use  . Smoking status: Current Every Day Smoker    Packs/day: 0.50    Years: 3.00    Pack years: 1.50    Types: Cigarettes  . Smokeless tobacco: Never Used  Substance Use Topics  . Alcohol use: Yes    Comment: occisonally   . Drug use: Not Currently    Types: Marijuana    Home Medications Prior to Admission medications   Medication Sig Start Date End Date Taking? Authorizing Provider  acetaminophen (TYLENOL) 500 MG tablet Take  1,000 mg by mouth every 6 (six) hours as needed for fever or headache.    [provider]  ibuprofen (ADVIL) 600 MG tablet Take 1 tablet (600 mg total) by mouth every 8 (eight) hours as needed. Patient not taking: Reported on 09/04/2019 06/05/19   Ladell Pier, MD  metroNIDAZOLE (FLAGYL) 500 MG tablet Take 1 tablet (500 mg total) by mouth 2 (two) times daily. 09/06/19   Ladell Pier, MD  SUMAtriptan (IMITREX) 50 MG tablet Take 1 tablet at the start of the headache.  May repeat in 2 hours if headache does not resolve.  Max 2 hours / 24-hour. 09/04/19   Ladell Pier, MD    Allergies    Patient has no known allergies.  Review of Systems   Review of Systems  Constitutional:       Per HPI, otherwise negative  HENT:       Per HPI, otherwise negative  Respiratory:       Per HPI, otherwise negative  Cardiovascular:       Per HPI, otherwise negative  Gastrointestinal: Negative for vomiting.  Endocrine:       Negative aside from HPI  Genitourinary:       Neg aside from HPI   Musculoskeletal:       Per HPI, otherwise negative  Skin: Negative.   Neurological: Negative for syncope.    Physical Exam Updated Vital Signs BP Marland Kitchen)  147/61 (BP Location: Left Arm)   Pulse 86   Temp 98.1 F (36.7 C) (Oral)   Resp 18   Ht 5\' 9"  (1.753 m)   Wt (!) 160.6 kg   LMP 09/14/2019   SpO2 96%   BMI 52.28 kg/m   Physical Exam Vitals and nursing note reviewed.  Constitutional:      General: She is not in acute distress.    Appearance: She is well-developed. She is obese. She is not toxic-appearing.  HENT:     Head: Normocephalic and atraumatic.  Eyes:     Conjunctiva/sclera: Conjunctivae normal.  Cardiovascular:     Rate and Rhythm: Normal rate and regular rhythm.  Pulmonary:     Effort: Pulmonary effort is normal. No respiratory distress.     Breath sounds: Normal breath sounds. No stridor.  Abdominal:     General: There is no distension.  Skin:    General: Skin is  warm and dry.  Neurological:     Mental Status: She is alert and oriented to person, place, and time.     Cranial Nerves: No cranial nerve deficit.     ED Results / Procedures / Treatments   Labs (all labs ordered are listed, but only abnormal results are displayed) Labs Reviewed - No data to display  EKG EKG Interpretation  Date/Time:  Saturday September 30 2019 12:28:50 EDT Ventricular Rate:  82 PR Interval:  192 QRS Duration: 66 QT Interval:  374 QTC Calculation: 436 R Axis:   12 Text Interpretation: Normal sinus rhythm Baseline wander Otherwise within normal limits Confirmed by Carmin Muskrat [phone]) on 09/30/2019 1:15:04 PM   Radiology No results found.  Procedures Procedures (including critical care time)  Smoking cessation provided, particularly in light of this patient's evaluation in the ED.   Medications Ordered in ED Medications  albuterol (VENTOLIN HFA) 108 (90 Base) MCG/ACT inhaler 2 puff (has no administration in time range)  predniSONE (DELTASONE) tablet 60 mg (has no administration in time range)  azithromycin (ZITHROMAX) tablet 500 mg (has no administration in time range)    ED Course  I have reviewed the triage vital signs and the nursing notes.  Pertinent labs & imaging results that were available during my care of the patient were reviewed by me and considered in my medical decision making (see chart for details).  After initial evaluation reviewed the patient's x-ray, no definite opacification concerning for pneumonia. Patient's vitals are unremarkable, and absent new oxygen requirement, no indication for additional advanced imaging. With reassuring EKG, low suspicion for atypical ACS given her denial of chest pain as well. Patient has received steroids, albuterol, will start short course of azithromycin for likely acute bronchitis, possibly secondary to her smoking. Patient appropriate for discharge with outpatient therapy. Final Clinical  Impression(s) / ED Diagnoses Final diagnoses:  Acute bronchitis, unspecified organism    Rx / DC Orders ED Discharge Orders         Ordered    azithromycin (ZITHROMAX) 250 MG tablet  Daily     09/30/19 1321    predniSONE (DELTASONE) 20 MG tablet  Daily with breakfast     09/30/19 1321           Carmin Muskrat, MD 09/30/19 1321

## 2019-09-30 NOTE — ED Triage Notes (Signed)
Pt. Stated, Ive had some wheezing and no asthma. I have coughing and when I cough it hurts all in my chest. This started 3 days ago.

## 2019-09-30 NOTE — Discharge Instructions (Addendum)
Please be sure to use the provided albuterol every 4 hours for the next 2 days.  You may then use it as needed. In addition use the prescribed antibiotics as directed.  Monitor your condition carefully and do not hesitate to return here for concerning changes.

## 2019-12-18 ENCOUNTER — Ambulatory Visit: Payer: Self-pay | Admitting: Nurse Practitioner

## 2019-12-19 ENCOUNTER — Other Ambulatory Visit: Payer: Self-pay

## 2019-12-19 ENCOUNTER — Other Ambulatory Visit: Payer: Self-pay | Admitting: *Deleted

## 2019-12-19 DIAGNOSIS — Z20822 Contact with and (suspected) exposure to covid-19: Secondary | ICD-10-CM

## 2019-12-20 LAB — SARS-COV-2, NAA 2 DAY TAT

## 2019-12-20 LAB — NOVEL CORONAVIRUS, NAA: SARS-CoV-2, NAA: DETECTED — AB

## 2020-04-04 ENCOUNTER — Emergency Department (HOSPITAL_COMMUNITY): Payer: Self-pay

## 2020-04-04 ENCOUNTER — Emergency Department (HOSPITAL_COMMUNITY)
Admission: EM | Admit: 2020-04-04 | Discharge: 2020-04-04 | Disposition: A | Discharge status: Home / Self Care | Payer: Self-pay | Attending: Emergency Medicine | Admitting: Emergency Medicine

## 2020-04-04 ENCOUNTER — Encounter (HOSPITAL_COMMUNITY): Payer: Self-pay | Admitting: Emergency Medicine

## 2020-04-04 DIAGNOSIS — R109 Unspecified abdominal pain: Secondary | ICD-10-CM

## 2020-04-04 DIAGNOSIS — F17210 Nicotine dependence, cigarettes, uncomplicated: Secondary | ICD-10-CM | POA: Insufficient documentation

## 2020-04-04 DIAGNOSIS — N83202 Unspecified ovarian cyst, left side: Secondary | ICD-10-CM | POA: Insufficient documentation

## 2020-04-04 DIAGNOSIS — R102 Pelvic and perineal pain: Secondary | ICD-10-CM

## 2020-04-04 DIAGNOSIS — R10812 Left upper quadrant abdominal tenderness: Secondary | ICD-10-CM | POA: Insufficient documentation

## 2020-04-04 LAB — COMPREHENSIVE METABOLIC PANEL
ALT: 16 U/L (ref 0–44)
AST: 13 U/L — ABNORMAL LOW (ref 15–41)
Albumin: 3.5 g/dL (ref 3.5–5.0)
Alkaline Phosphatase: 57 U/L (ref 38–126)
Anion gap: 10 (ref 5–15)
BUN: 6 mg/dL (ref 6–20)
CO2: 25 mmol/L (ref 22–32)
Calcium: 9 mg/dL (ref 8.9–10.3)
Chloride: 102 mmol/L (ref 98–111)
Creatinine, Ser: 0.71 mg/dL (ref 0.44–1.00)
GFR, Estimated: >60 mL/min (ref >60)
Glucose, Bld: 96 mg/dL (ref 70–99)
Potassium: 3.8 mmol/L (ref 3.5–5.1)
Sodium: 137 mmol/L (ref 135–145)
Total Bilirubin: 0.8 mg/dL (ref 0.3–1.2)
Total Protein: 6.8 g/dL (ref 6.5–8.1)

## 2020-04-04 LAB — CBC
HCT: 42 % (ref 36.0–46.0)
Hemoglobin: 14.7 g/dL (ref 12.0–15.0)
MCH: 27 pg (ref 26.0–34.0)
MCHC: 35 g/dL (ref 30.0–36.0)
MCV: 77.1 fL — ABNORMAL LOW (ref 80.0–100.0)
Platelets: 411 10*3/uL — ABNORMAL HIGH (ref 150–400)
RBC: 5.45 MIL/uL — ABNORMAL HIGH (ref 3.87–5.11)
RDW: 14.7 % (ref 11.5–15.5)
WBC: 7.9 10*3/uL (ref 4.0–10.5)
nRBC: 0 % (ref 0.0–0.2)

## 2020-04-04 LAB — I-STAT BETA HCG BLOOD, ED (MC, WL, AP ONLY): I-stat hCG, quantitative: <5 m[IU]/mL (ref <5)

## 2020-04-04 LAB — URINALYSIS, ROUTINE W REFLEX MICROSCOPIC
Bilirubin Urine: NEGATIVE
Glucose, UA: NEGATIVE mg/dL
Hgb urine dipstick: NEGATIVE
Ketones, ur: NEGATIVE mg/dL
Leukocytes,Ua: NEGATIVE
Nitrite: NEGATIVE
Protein, ur: NEGATIVE mg/dL
Specific Gravity, Urine: 1.017 (ref 1.005–1.030)
pH: 5 (ref 5.0–8.0)

## 2020-04-04 LAB — WET PREP, GENITAL
Clue Cells Wet Prep HPF POC: NONE SEEN
Sperm: NONE SEEN
Trich, Wet Prep: NONE SEEN
WBC, Wet Prep HPF POC: NONE SEEN
Yeast Wet Prep HPF POC: NONE SEEN

## 2020-04-04 LAB — LIPASE, BLOOD: Lipase: 20 U/L (ref 11–51)

## 2020-04-04 MED ORDER — DICYCLOMINE HCL 10 MG PO CAPS
10.0000 mg | ORAL_CAPSULE | Freq: Once | ORAL | Status: AC
  Administered 2020-04-04: 10 mg via ORAL
  Filled 2020-04-04: qty 1

## 2020-04-04 MED ORDER — NAPROXEN 500 MG PO TABS: 500.0000 mg | ORAL_TABLET | Freq: Two times a day (BID) | ORAL | 0 refills | Status: DC

## 2020-04-04 MED ORDER — HYDROCODONE-ACETAMINOPHEN 5-325 MG PO TABS
1.0000 | ORAL_TABLET | Freq: Once | ORAL | Status: AC
  Administered 2020-04-04: 1 via ORAL
  Filled 2020-04-04: qty 1

## 2020-04-04 MED ORDER — DICYCLOMINE HCL 20 MG PO TABS: 20.0000 mg | ORAL_TABLET | Freq: Two times a day (BID) | ORAL | 0 refills | Status: DC

## 2020-04-04 NOTE — Discharge Instructions (Addendum)
Take the medications as prescribed  Return for new or worsening symptoms 

## 2020-04-04 NOTE — ED Notes (Signed)
Dc instructions reviewed with pt. PT verbalized understanding. PT DC 

## 2020-04-04 NOTE — ED Triage Notes (Signed)
Pt reports LLQ pain that began yesterday, reports that pain has been off and on in the same area. Denies n/v/d, constipation or urinary symptoms.

## 2020-04-04 NOTE — ED Provider Notes (Signed)
[facility] DEPARTMENT Provider Note   CSN: [id number] Arrival date & time: 04/04/20  1149     History Chief Complaint  Patient presents with  . Abdominal Pain    Judy Hanna is a 34 y.o. female with history significant for tobacco abuse, obesity who presents for evaluation of abdominal pain.  Patient states she has been having left-sided abdominal pain.  Patient states she feels like she will get a "knot" to the left side of her abdomen.  This is diffusely her upper and lower abdomen. Cramping will last a few minutes and self resolved.  No diarrhea.  Will occasionally have left flank pain.  No hematuria, dysuria.  Patient denies fever, chills, nausea vomiting, chest pain, shortness of breath.  Denies additional aggravating or relieving factors.  Rates her current pain an 8/10.  Denies any pelvic pain, vaginal discharge.  Sexually active with men and women.  She does not want pelvic exam however would like to self swab for STDs.  History obtained from patient and past medical records. No interpretor was used.  HPI     History reviewed. No pertinent past medical history.  Patient Active Problem List   Diagnosis Date Noted  . Positive depression screening 06/05/2019  . Morbid obesity ([place]) 06/05/2019  . Tobacco dependence 06/05/2019  . Menstrual migraine without status migrainosus, not intractable 06/05/2019  . Dysmenorrhea 06/05/2019  . History of abnormal cervical Pap smear 06/05/2019    History reviewed. No pertinent surgical history.   OB History   No obstetric history on file.     Family History  Problem Relation Age of Onset  . Hypertension Mother   . Hypertension Sister   . Hypertension Maternal Grandmother     Social History   Tobacco Use  . Smoking status: Current Every Day Smoker    Packs/day: 0.50    Years: 3.00    Pack years: 1.50    Types: Cigarettes  . Smokeless tobacco: Never Used  Vaping Use  . Vaping Use: Never used   Substance Use Topics  . Alcohol use: Yes    Comment: occisonally   . Drug use: Not Currently    Types: Marijuana    Home Medications Prior to Admission medications   Medication Sig Start Date End Date Taking? Authorizing Provider  acetaminophen (TYLENOL) 500 MG tablet Take 1,000 mg by mouth every 6 (six) hours as needed for fever or headache.    [provider]  dicyclomine (BENTYL) 20 MG tablet Take 1 tablet (20 mg total) by mouth 2 (two) times daily. 04/04/20   Jamis Kryder A, PA-C  ibuprofen (ADVIL) 600 MG tablet Take 1 tablet (600 mg total) by mouth every 8 (eight) hours as needed. Patient not taking: Reported on 09/04/2019 06/05/19   Ladell Pier, MD  metroNIDAZOLE (FLAGYL) 500 MG tablet Take 1 tablet (500 mg total) by mouth 2 (two) times daily. 09/06/19   Ladell Pier, MD  naproxen (NAPROSYN) 500 MG tablet Take 1 tablet (500 mg total) by mouth 2 (two) times daily. 04/04/20   Haidynn Almendarez A, PA-C  predniSONE (DELTASONE) 20 MG tablet Take 2 tablets (40 mg total) by mouth daily with breakfast. For the next four days 09/30/19   Carmin Muskrat, MD  SUMAtriptan (IMITREX) 50 MG tablet Take 1 tablet at the start of the headache.  May repeat in 2 hours if headache does not resolve.  Max 2 hours / 24-hour. 09/04/19   Ladell Pier, MD  Allergies    Patient has no known allergies.  Review of Systems   Review of Systems  Constitutional: Negative.   HENT: Negative.   Respiratory: Negative.   Cardiovascular: Negative.   Gastrointestinal: Positive for abdominal pain.  Genitourinary: Positive for flank pain (Left). Negative for decreased urine volume, difficulty urinating, dyspareunia, dysuria, enuresis, frequency, genital sores, hematuria, menstrual problem, pelvic pain, urgency, vaginal bleeding, vaginal discharge and vaginal pain.  Skin: Negative.   Neurological: Negative.   All other systems reviewed and are negative.   Physical Exam Updated Vital  Signs BP (!) 141/83 (BP Location: Left Arm)   Pulse 96   Temp 98 F (36.7 C)   Resp 16   Ht 5\' 9"  (1.753 m)   SpO2 100%   BMI 52.28 kg/m   Physical Exam Vitals and nursing note reviewed.  Constitutional:      General: She is not in acute distress.    Appearance: She is well-developed and well-nourished. She is obese. She is not ill-appearing or toxic-appearing.  HENT:     Head: Normocephalic and atraumatic.     Mouth/Throat:     Mouth: Mucous membranes are moist.  Eyes:     Pupils: Pupils are equal, round, and reactive to light.  Cardiovascular:     Rate and Rhythm: Normal rate.     Pulses: Intact distal pulses.     Heart sounds: Normal heart sounds.  Pulmonary:     Effort: Pulmonary effort is normal. No respiratory distress.     Breath sounds: Normal breath sounds.  Abdominal:     General: Bowel sounds are normal. There is no distension.     Palpations: Abdomen is soft.     Tenderness: There is abdominal tenderness in the left upper quadrant and left lower quadrant. There is no right CVA tenderness, guarding or rebound. Negative signs include Murphy's sign and McBurney's sign.  Genitourinary:    Comments: Declined GU exam, elects to self swab Musculoskeletal:        General: Normal range of motion.     Cervical back: Normal range of motion.  Skin:    General: Skin is warm and dry.  Neurological:     Mental Status: She is alert.  Psychiatric:        Mood and Affect: Mood and affect normal.    ED Results / Procedures / Treatments   Labs (all labs ordered are listed, but only abnormal results are displayed) Labs Reviewed  COMPREHENSIVE METABOLIC PANEL - Abnormal; Notable for the following components:      Result Value   AST 13 (*)    All other components within normal limits  CBC - Abnormal; Notable for the following components:   RBC 5.45 (*)    MCV 77.1 (*)    Platelets 411 (*)    All other components within normal limits  WET PREP, GENITAL  URINE CULTURE   LIPASE, BLOOD  URINALYSIS, ROUTINE W REFLEX MICROSCOPIC  I-STAT BETA HCG BLOOD, ED ([facility], [facility], [facility] ONLY)  GC/CHLAMYDIA PROBE AMP ([place]) NOT AT [facility]    EKG None  Radiology CT ABDOMEN PELVIS WO CONTRAST  Result Date: 04/04/2020 CLINICAL DATA:  Left lower quadrant pain. EXAM: CT ABDOMEN AND PELVIS WITHOUT CONTRAST TECHNIQUE: Multidetector CT imaging of the abdomen and pelvis was performed following the standard protocol without IV contrast. COMPARISON:  Pelvic ultrasound, dated April 04, 2020 FINDINGS: Lower chest: No acute abnormality. Hepatobiliary: No focal liver abnormality is seen. No gallstones, gallbladder wall thickening,  or biliary dilatation. Pancreas: Unremarkable. No pancreatic ductal dilatation or surrounding inflammatory changes. Spleen: Normal in size without focal abnormality. Adrenals/Urinary Tract: Adrenal glands are unremarkable. Kidneys are normal, without renal calculi, focal lesion, or hydronephrosis. Bladder is unremarkable. Stomach/Bowel: Stomach is within normal limits. Appendix appears normal. No evidence of bowel wall thickening, distention, or inflammatory changes. Vascular/Lymphatic: No significant vascular findings are present. No enlarged abdominal or pelvic lymph nodes. Reproductive: A 9.2 cm x 7.6 cm isodense mass is seen within the uterine fundus. A similar appearing 9.3 cm x 7.4 cm isodense mass is noted within the expected region of the lower uterine segment. A 2.6 cm x 2.6 cm well-defined area of low attenuation (approximately 11.16 Hounsfield units) is seen along the superior aspect of the left adnexa (axial CT image 72, CT series number 3/coronal reformatted image 50, CT series number 6). Other: A 2.7 cm x 2.9 cm fat containing umbilical hernia is seen. No abdominopelvic ascites. Musculoskeletal: Moderate severity degenerative changes seen at the level of L5-S1. IMPRESSION: 1. Findings likely consistent with large noncalcified uterine fibroids. This corresponds  to the findings seen on the prior pelvic ultrasound. 2. Additional findings along the left adnexa which may correspond to the left ovarian cyst seen on the prior pelvic ultrasound. No follow-up imaging recommended. Note: This recommendation does not apply to premenarchal patients and to those with increased risk (genetic, family history, elevated tumor markers or other high-risk factors) of ovarian cancer. Reference: JACR 2020 Feb; 17(2):248-254 3. Degenerative changes at the level of L5-S1. Electronically Signed   By: Virgina Norfolk M.D.   On: 04/04/2020 21:12   US PELVIC COMPLETE W TRANSVAGINAL AND TORSION R/O  Result Date: 04/04/2020 CLINICAL DATA:  Initial evaluation for acute left lower quadrant pain. EXAM: TRANSABDOMINAL AND TRANSVAGINAL ULTRASOUND OF PELVIS DOPPLER ULTRASOUND OF OVARIES TECHNIQUE: Both transabdominal and transvaginal ultrasound examinations of the pelvis were performed. Transabdominal technique was performed for global imaging of the pelvis including uterus, ovaries, adnexal regions, and pelvic cul-de-sac. It was necessary to proceed with endovaginal exam following the transabdominal exam to visualize the uterus, endometrium, and ovaries. Color and duplex Doppler ultrasound was utilized to evaluate blood flow to the ovaries. COMPARISON:  None available. FINDINGS: Uterus Measurements: 14.9 x 7.4 x 9.1 cm = volume: 524.2 mL. 8.8 x 6.5 x 8.5 cm exophytic fibroid extends from the right uterine fundus. Additional large fibroid centered at the left uterine body measures 7.2 x 6.7 x 7.7 cm Endometrium Thickness: 8.9 mm. Endometrial stripe somewhat difficult to visualize due to overlying fibroids. No definite focal abnormality. Right ovary Measurements: 3.1 x 1.9 x 2.9 cm = volume: 8.5 mL. Normal appearance/no adnexal mass. Left ovary Measurements: 3.2 x 3.0 x 3.2 cm = volume: 50.9 mL. 2.0 x 1.2 x 1.8 cm simple cyst, most consistent with a normal physiologic follicular cyst/dominant follicle.  Pulsed Doppler evaluation of both ovaries demonstrates normal low-resistance arterial and venous waveforms. Other findings No abnormal free fluid. IMPRESSION: 1. Enlarged fibroid uterus as detailed above. 2. 2 cm simple left ovarian cyst, most consistent with a normal physiologic follicular cyst/dominant follicle. No followup imaging recommended. note: This recommendation does not apply to premenarchal patients or to those with increased risk (genetic, family history, elevated tumor markers or other high-risk factors) of ovarian cancer. Reference: Radiology 2019 Nov; 293(2):359-371. 3. No evidence for ovarian torsion or other acute abnormality. Electronically Signed   By: Jeannine Boga M.D.   On: 04/04/2020 19:28    Procedures Procedures (including critical  care time)  Medications Ordered in ED Medications  HYDROcodone-acetaminophen (NORCO/VICODIN) 5-325 MG per tablet 1 tablet (1 tablet Oral Given 04/04/20 2108)  dicyclomine (BENTYL) capsule 10 mg (10 mg Oral Given 04/04/20 2108)    ED Course  I have reviewed the triage vital signs and the nursing notes.  Pertinent labs & imaging results that were available during my care of the patient were reviewed by me and considered in my medical decision making (see chart for details).  34 year old presents for evaluation of intermittent left lower quadrant pain.  She is afebrile, nonseptic, non-ill-appearing.  Also having diffuse abdominal spasms.  She is tolerating p.o. intake.  No urinary complaints.  Sexually active and uses protection.  Patient declined GU exam, patient understanding cannot rule out PID without GU exam.  Does elect self swab.  She denies any pelvic pain, vaginal discharge.  Patient states "while I am here I might as well be tested."  Plan labs, imaging and reassess.  Labs and imaging personally reviewed and interpreted:  CBC without leukocytosis Metabolic panel without electrolyte, renal normality UA negative for  infection Pregnancy test negative Lipase 20 Wet prep without any significant abnormality, GC pending [place] with left 2cm cyst  CT scan due to IV access problems.  Will obtain CT without contrast as patient does not want to wait for IV placement.  CT WO contrast shows fibroid uterus and cysts. No additional pathology.   Patient reassessed.  Discussed labs and imaging.  Pain controlled here in ED.  Patient with left lower quadrant pain likely due to cyst.  Because abdominal cramping possible gastroenteritis, relief with Bentyl.  No evidence of acute emergent surgical pathology."  Discussed follow up with PCP return for any worsening symptoms which patient has agreed to.  Patient is nontoxic, nonseptic appearing, in no apparent distress.  Patient's pain and other symptoms adequately managed in emergency department.  Fluid bolus given.  Labs, imaging and vitals reviewed.  Patient does not meet the SIRS or Sepsis criteria.  On repeat exam patient does not have a surgical abdomin and there are no peritoneal signs.  No indication of appendicitis, bowel obstruction, bowel perforation, cholecystitis, diverticulitis, PID, TOA, torsion or ectopic pregnancy.    The patient has been appropriately medically screened and/or stabilized in the ED. I have low suspicion for any other emergent medical condition which would require further screening, evaluation or treatment in the ED or require inpatient management.  Patient is hemodynamically stable and in no acute distress.  Patient able to ambulate in department prior to ED.  Evaluation does not show acute pathology that would require ongoing or additional emergent interventions while in the emergency department or further inpatient treatment.  I have discussed the diagnosis with the patient and answered all questions.  Pain is been managed while in the emergency department and patient has no further complaints prior to discharge.  Patient is comfortable with plan  discussed in room and is stable for discharge at this time.  I have discussed strict return precautions for returning to the emergency department.  Patient was encouraged to follow-up with PCP/specialist refer to at discharge.    MDM Rules/Calculators/A&P                           Final Clinical Impression(s) / ED Diagnoses Final diagnoses:  Cyst of left ovary  Abdominal cramping    Rx / DC Orders ED Discharge Orders  Ordered    dicyclomine (BENTYL) 20 MG tablet  2 times daily        04/04/20 2119    naproxen (NAPROSYN) 500 MG tablet  2 times daily        04/04/20 2119           Jonte Shiller A, PA-C 04/04/20 2125    Truddie Hidden, MD 04/04/20 2140

## 2020-04-05 LAB — GC/CHLAMYDIA PROBE AMP (~~LOC~~) NOT AT ARMC
Chlamydia: NEGATIVE
Comment: NEGATIVE
Comment: NORMAL
Neisseria Gonorrhea: NEGATIVE

## 2020-04-05 LAB — URINE CULTURE: Culture: 30000 — AB

## 2020-04-23 NOTE — Progress Notes (Signed)
Patient ID: Judy Hanna, female   DOB: [birth year], 34 y.o.   MRN: [id number]  Virtual Visit via Telephone Note  I connected with Judy Hanna on 04/25/20 at  3:30 PM EST by telephone and verified that I am speaking with the correct person using two identifiers.  Location: Patient: Judy Hanna Provider: Georgian Co, PA-C   I discussed the limitations, risks, security and privacy concerns of performing an evaluation and management service by telephone and the availability of in person appointments. I also discussed with the patient that there may be a patient responsible charge related to this service. The patient expressed understanding and agreed to proceed.  PATIENT visit by telephone virtually in the context of Covid-19 pandemic. Patient location:  home My Location:  [facility] office Persons on the call:  Me and the patient;  Roomed by Felecia Shelling   History of Present Illness: ED 04/14/2020 and founds to have ovarian cyst(physiologic) and uterine fibroids.  Pain is better.  Occasionally takes tylenol but would like RF on naprosyn.  No fever.  No N/V/D.  Pain has been much less since ED visit.     Pertinent labs & imaging results that were available during my care of the patient were reviewed by me and considered in my medical decision making (see chart for details).  34 year old presents for evaluation of intermittent left lower quadrant pain.  She is afebrile, nonseptic, non-ill-appearing.  Also having diffuse abdominal spasms.  She is tolerating p.o. intake.  No urinary complaints.  Sexually active and uses protection.  Patient declined GU exam, patient understanding cannot rule out PID without GU exam.  Does elect self swab.  She denies any pelvic pain, vaginal discharge.  Patient states "while I am here I might as well be tested."  Plan labs, imaging and reassess.  Labs and imaging personally reviewed and interpreted:  CBC without leukocytosis Metabolic panel without electrolyte, renal  normality UA negative for infection Pregnancy test negative Lipase 20 Wet prep without any significant abnormality, GC pending [place] with left 2cm cyst  CT scan due to IV access problems.  Will obtain CT without contrast as patient does not want to wait for IV placement.  CT WO contrast shows fibroid uterus and cysts. No additional pathology.   Patient reassessed.  Discussed labs and imaging.  Pain controlled here in ED.  Patient with left lower quadrant pain likely due to cyst.  Because abdominal cramping possible gastroenteritis, relief with Bentyl.  No evidence of acute emergent surgical pathology."  Discussed follow up with PCP return for any worsening symptoms which patient has agreed to.  Patient is nontoxic, nonseptic appearing, in no apparent distress.  Patient's pain and other symptoms adequately managed in emergency department.  Fluid bolus given.  Labs, imaging and vitals reviewed.  Patient does not meet the SIRS or Sepsis criteria.  On repeat exam patient does not have a surgical abdomin and there are no peritoneal signs.  No indication of appendicitis, bowel obstruction, bowel perforation, cholecystitis, diverticulitis, PID, TOA, torsion or ectopic pregnancy.    The patient has been appropriately medically screened and/or stabilized in the ED. I have low suspicion for any other emergent medical condition which would require further screening, evaluation or treatment in the ED or require inpatient management.  Patient is hemodynamically stable and in no acute distress.  Patient able to ambulate in department prior to ED.  Evaluation does not show acute pathology that would require ongoing or additional emergent interventions while in the emergency  department or further inpatient treatment.  I have discussed the diagnosis with the patient and answered all questions.  Pain is been managed while in the emergency department and patient has no further complaints prior to discharge.   Patient is comfortable with plan discussed in room and is stable for discharge at this time.  I have discussed strict return precautions for returning to the emergency department.  Patient was encouraged to follow-up with PCP/specialist refer to at discharge.   Observations/Objective: NAD.  A&Ox3  Assessment and Plan: 1. Abnormal CT of the abdomen See #2  2. Fibroids Pain has improved - Ambulatory referral to Gynecology - naproxen (NAPROSYN) 500 MG tablet; Take 1 tablet (500 mg total) by mouth 2 (two) times daily. Prn pain  Dispense: 60 tablet; Refill: 1  3. Encounter for examination following treatment at hospital   Follow Up Instructions: See PCP prn   I discussed the assessment and treatment plan with the patient. The patient was provided an opportunity to ask questions and all were answered. The patient agreed with the plan and demonstrated an understanding of the instructions.   The patient was advised to call back or seek an in-person evaluation if the symptoms worsen or if the condition fails to improve as anticipated.  I provided 16 minutes of non-face-to-face time during this encounter.   Georgian Co, PA-C

## 2020-04-25 ENCOUNTER — Other Ambulatory Visit: Payer: Self-pay | Admitting: Physician Assistant

## 2020-04-25 ENCOUNTER — Other Ambulatory Visit: Payer: Self-pay

## 2020-04-25 ENCOUNTER — Ambulatory Visit: Payer: Self-pay | Attending: Physician Assistant | Admitting: Physician Assistant

## 2020-04-25 ENCOUNTER — Encounter: Payer: Self-pay | Admitting: Physician Assistant

## 2020-04-25 DIAGNOSIS — D219 Benign neoplasm of connective and other soft tissue, unspecified: Secondary | ICD-10-CM

## 2020-04-25 DIAGNOSIS — R935 Abnormal findings on diagnostic imaging of other abdominal regions, including retroperitoneum: Secondary | ICD-10-CM

## 2020-04-25 DIAGNOSIS — Z09 Encounter for follow-up examination after completed treatment for conditions other than malignant neoplasm: Secondary | ICD-10-CM

## 2020-04-25 MED ORDER — NAPROXEN 500 MG PO TABS: 500.0000 mg | ORAL_TABLET | Freq: Two times a day (BID) | ORAL | 1 refills | Status: DC

## 2020-04-25 MED FILL — NAPROXEN 500 MG TABLET: 500 | 30 days supply | Qty: 60 | Fill #0

## 2020-05-01 ENCOUNTER — Inpatient Hospital Stay: Payer: Self-pay | Admitting: Physician Assistant

## 2020-05-19 ENCOUNTER — Other Ambulatory Visit: Payer: Self-pay

## 2020-05-19 ENCOUNTER — Emergency Department (HOSPITAL_COMMUNITY)
Admission: EM | Admit: 2020-05-19 | Discharge: 2020-05-20 | Disposition: A | Discharge status: Home / Self Care | Payer: Self-pay | Attending: Emergency Medicine | Admitting: Emergency Medicine

## 2020-05-19 DIAGNOSIS — N644 Mastodynia: Secondary | ICD-10-CM | POA: Insufficient documentation

## 2020-05-19 DIAGNOSIS — F17210 Nicotine dependence, cigarettes, uncomplicated: Secondary | ICD-10-CM | POA: Insufficient documentation

## 2020-05-19 DIAGNOSIS — R2232 Localized swelling, mass and lump, left upper limb: Secondary | ICD-10-CM | POA: Insufficient documentation

## 2020-05-19 NOTE — ED Triage Notes (Signed)
Pt has several places under her arm that are swollen and hard. Pt said painful to touch and making her arm sore.

## 2020-05-20 ENCOUNTER — Telehealth: Payer: Self-pay | Admitting: Internal Medicine

## 2020-05-20 ENCOUNTER — Other Ambulatory Visit: Payer: Self-pay | Admitting: Emergency Medicine

## 2020-05-20 MED ORDER — DOXYCYCLINE HYCLATE 100 MG PO CAPS: 100.0000 mg | ORAL_CAPSULE | Freq: Two times a day (BID) | ORAL | 0 refills | Status: DC

## 2020-05-20 MED ORDER — DOXYCYCLINE HYCLATE 100 MG PO CAPS: 100.0000 mg | ORAL_CAPSULE | Freq: Two times a day (BID) | ORAL | 0 refills | Status: AC

## 2020-05-20 MED FILL — DOXYCYCLINE HYCLATE 100 MG: 100 | 7 days supply | Qty: 14 | Fill #0

## 2020-05-20 NOTE — ED Provider Notes (Signed)
[facility] EMERGENCY DEPARTMENT Provider Note   CSN: [id number] Arrival date & time: 05/19/20  2109     History Chief Complaint  Patient presents with  . Abscess    Judy Hanna is a 35 y.o. female.  HPI Patient is a 35 year old female presenting today with left armpit pain she states that it is ongoing and consistent over the past 2 to 3 weeks. She states that she also has some breast tenderness around her nipple with approximately the same period of time.  She denies any nipple discharge changes in the skin texture of her breast.  She denies any fevers or chills.  She states she has not seen her primary care doctor for this yet.  She has not tried any medication other than Tylenol 1000 mg which she has taken twice a day she states it gives her temporary relief.  She denies any weight changes cough hemoptysis lightheadedness or dizziness.  No other associate symptoms.  No aggravating mitigating factors apart from worse with touch.     No past medical history on file.  Patient Active Problem List   Diagnosis Date Noted  . Positive depression screening 06/05/2019  . Morbid obesity ([place]) 06/05/2019  . Tobacco dependence 06/05/2019  . Menstrual migraine without status migrainosus, not intractable 06/05/2019  . Dysmenorrhea 06/05/2019  . History of abnormal cervical Pap smear 06/05/2019    No past surgical history on file.   OB History   No obstetric history on file.     Family History  Problem Relation Age of Onset  . Hypertension Mother   . Hypertension Sister   . Hypertension Maternal Grandmother     Social History   Tobacco Use  . Smoking status: Current Every Day Smoker    Packs/day: 0.50    Years: 3.00    Pack years: 1.50    Types: Cigarettes  . Smokeless tobacco: Never Used  Vaping Use  . Vaping Use: Never used  Substance Use Topics  . Alcohol use: Yes    Comment: occisonally   . Drug use: Not Currently    Types: Marijuana     Home Medications Prior to Admission medications   Medication Sig Start Date End Date Taking? Authorizing Provider  doxycycline (VIBRAMYCIN) 100 MG capsule Take 1 capsule (100 mg total) by mouth 2 (two) times daily for 7 days. 05/20/20 05/27/20 Yes Candida Vetter, Ova Freshwater S, PA  acetaminophen (TYLENOL) 500 MG tablet Take 1,000 mg by mouth every 6 (six) hours as needed for fever or headache.    [provider]  dicyclomine (BENTYL) 20 MG tablet Take 1 tablet (20 mg total) by mouth 2 (two) times daily. 04/04/20   Henderly, Britni A, PA-C  metroNIDAZOLE (FLAGYL) 500 MG tablet Take 1 tablet (500 mg total) by mouth 2 (two) times daily. 09/06/19   Ladell Pier, MD  naproxen (NAPROSYN) 500 MG tablet Take 1 tablet (500 mg total) by mouth 2 (two) times daily. Prn pain 04/25/20   Argentina Donovan, PA-C  SUMAtriptan (IMITREX) 50 MG tablet Take 1 tablet at the start of the headache.  May repeat in 2 hours if headache does not resolve.  Max 2 hours / 24-hour. 09/04/19   Ladell Pier, MD    Allergies    Patient has no known allergies.  Review of Systems   Review of Systems  Constitutional: Negative for activity change, appetite change, fever and unexpected weight change.  HENT: Negative for congestion.   Respiratory: Negative for  shortness of breath.   Cardiovascular: Negative for chest pain.       Left breast pain  Gastrointestinal: Negative for abdominal distention.  Neurological: Negative for dizziness and headaches.    Physical Exam Updated Vital Signs BP 138/88 (BP Location: Right Arm)   Pulse 78   Temp 98.2 F (36.8 C) (Oral)   Resp 18   LMP 04/21/2020   SpO2 100%   Physical Exam Vitals and nursing note reviewed.  Constitutional:      General: She is not in acute distress.    Appearance: Normal appearance. She is obese. She is not ill-appearing.     Comments: Pleasant morbidly obese 35 year old female sitting comfortably in bed.  HENT:     Head: Normocephalic and  atraumatic.     Mouth/Throat:     Mouth: Mucous membranes are moist.  Eyes:     General: No scleral icterus.       Right eye: No discharge.        Left eye: No discharge.     Conjunctiva/sclera: Conjunctivae normal.  Cardiovascular:     Rate and Rhythm: Normal rate.  Pulmonary:     Effort: Pulmonary effort is normal.     Breath sounds: Normal breath sounds. No stridor. No wheezing.     Comments: Left breast with mild tenderness to palpation just lateral to the left nipple over the area of the areola Abdominal:     Tenderness: There is no abdominal tenderness.  Skin:    Comments: Skin is warm and dry.  There is hard beadlike lymph node in the left axilla.  There is no fluctuance erythema or warmth to touch.  There also 2 areas above the axillary crease within the armpit that are hard and tender to touch.  Again no fluctuance.  Bedside ultrasound was used to evaluate there is no evidence of fluid collection.  Neurological:     Mental Status: She is alert and oriented to person, place, and time. Mental status is at baseline.     ED Results / Procedures / Treatments   Labs (all labs ordered are listed, but only abnormal results are displayed) Labs Reviewed - No data to display  EKG None  Radiology No results found.  Procedures Procedures   Medications Ordered in ED Medications - No data to display  ED Course  I have reviewed the triage vital signs and the nursing notes.  Pertinent labs & imaging results that were available during my care of the patient were reviewed by me and considered in my medical decision making (see chart for details).    MDM Rules/Calculators/A&P                          Patient is 35 year old female presented today with tender swollen area in her left armpit.  She has no systemic symptoms of illness such as fevers, chills, nausea, fatigue malaise.  She is overall well-appearing has normal vital signs she is morbidly obese she has several small hard  tender areas in her left armpit. She also is complaining of some left breast tenderness.   I discussed with patient that given my examination and my use of the ultrasound soft tissue examination I have low suspicion for an abscess however we will go ahead and cover for early abscess formation with doxycycline and warm compresses.  I have high suspicion for inflamed lymph node.  This may spontaneously resolve however she has not had any mammography  done and I think it is reasonable to have her follow-up with the breast center for evaluation.  She will follow up with her primary care doctor in the meantime.  She is given return precautions.  She has no other associated symptoms.  Vital signs are within normal limits at time of discharge.  She is agreeable to plan and understands return precautions.  Tylenol and ibuprofen recommendations given.  Final Clinical Impression(s) / ED Diagnoses Final diagnoses:  Mass of left axilla    Rx / DC Orders ED Discharge Orders         Ordered    doxycycline (VIBRAMYCIN) 100 MG capsule  2 times daily        05/20/20 1054           Pati Gallo Pabellones, [place] 05/20/20 1120    Hayden Rasmussen, MD 05/20/20 1751

## 2020-05-20 NOTE — Discharge Instructions (Addendum)
Please use warm compresses to the area and take doxycycline as prescribed.  As we discussed the area of swelling in the armpit is somewhat unusual for an abscess I have some concern that this could be a swollen lymph node.  It is important to follow-up closely with your primary care doctor for reevaluation as the presentation/examination of this area may change and may warrant different treatment. In the meantime, given the right breast pain and the lump in the armpit, I think it is reasonable to follow-up with the breast clinic.  I have given you their information.  Please use Tylenol or ibuprofen for pain.  You may use 600 mg ibuprofen every 6 hours or 1000 mg of Tylenol every 6 hours.  You may choose to alternate between the 2.  This would be most effective.  Not to exceed 4 g of Tylenol within 24 hours.  Not to exceed 3200 mg ibuprofen 24 hours.

## 2020-05-20 NOTE — Telephone Encounter (Signed)
Patient called to ask the doctor for a referral to a breast center.  Tried to schedule appt. But there was nothing before end of march.  Please call patient to see if she can be worked in earlier.  CB# [phone]

## 2020-05-21 ENCOUNTER — Emergency Department (HOSPITAL_COMMUNITY)
Admission: EM | Admit: 2020-05-21 | Discharge: 2020-05-21 | Disposition: A | Discharge status: Home / Self Care | Payer: Self-pay | Attending: Emergency Medicine | Admitting: Emergency Medicine

## 2020-05-21 ENCOUNTER — Encounter (HOSPITAL_COMMUNITY): Payer: Self-pay

## 2020-05-21 ENCOUNTER — Other Ambulatory Visit: Payer: Self-pay

## 2020-05-21 DIAGNOSIS — F17210 Nicotine dependence, cigarettes, uncomplicated: Secondary | ICD-10-CM | POA: Insufficient documentation

## 2020-05-21 DIAGNOSIS — N6332 Unspecified lump in axillary tail of the left breast: Secondary | ICD-10-CM | POA: Insufficient documentation

## 2020-05-21 DIAGNOSIS — R2232 Localized swelling, mass and lump, left upper limb: Secondary | ICD-10-CM

## 2020-05-21 MED ORDER — NAPROXEN 500 MG PO TABS: 500.0000 mg | ORAL_TABLET | Freq: Two times a day (BID) | ORAL | 0 refills | Status: DC

## 2020-05-21 MED ORDER — NAPROXEN 500 MG PO TABS
500.0000 mg | ORAL_TABLET | Freq: Once | ORAL | Status: AC
  Administered 2020-05-21: 500 mg via ORAL
  Filled 2020-05-21: qty 1

## 2020-05-21 NOTE — ED Notes (Signed)
An After Visit Summary was printed and given to the patient. Discharge instructions given and no further questions at this time.  

## 2020-05-21 NOTE — Discharge Instructions (Signed)
As discussed, the breast center referral needs to come from PCP or OBGYN. I have included the number for the OBGYN, they may be able to get you in earlier than your PCP. Continue taking doxycycline as prescribed. Warm compresses 5 times days. Naproxen as needed for pain and swelling. Return to the ER for new or worsening symptoms.

## 2020-05-21 NOTE — ED Provider Notes (Signed)
[facility] DEPT Provider Note   CSN: [id number] Arrival date & time: 05/21/20  1241     History Chief Complaint  Patient presents with  . Mass    Judy Hanna is a 35 y.o. female with no significant past medical history who presents to the ED due to worsening pain left axilla region x2 to 3 weeks.  Patient was evaluated in the ED on 1/23 for the same complaint and discharged with doxycycline due to possible early abscess formation versus lymph node.  Patient states pain has persisted and areas have slightly grown in size over the past 2 days.  Denies fever and chills.  She has been taking Tylenol as needed for pain with moderate relief in symptoms.  She has also been compliant with doxycycline. No drainage from site. Has a history of previous abscesses, but none in the axilla region. Denies rhinorrhea, congestion, cough, shortness of breath, chest pain, arm pain, nausea, vomiting, diarrhea.  Per notes on 1/23 patient was also having some left breast tenderness for the past 2 to 3 weeks.  Patient states she called the breast center and was unable to make an appointment because she did not have a referral.  Denies nipple discharge and change in texture of her breast.  No change in symptoms from previous ED visit.  History obtained from patient and past medical records. No interpreter used during encounter.      History reviewed. No pertinent past medical history.  Patient Active Problem List   Diagnosis Date Noted  . Positive depression screening 06/05/2019  . Morbid obesity ([place]) 06/05/2019  . Tobacco dependence 06/05/2019  . Menstrual migraine without status migrainosus, not intractable 06/05/2019  . Dysmenorrhea 06/05/2019  . History of abnormal cervical Pap smear 06/05/2019    History reviewed. No pertinent surgical history.   OB History   No obstetric history on file.     Family History  Problem Relation Age of Onset  . Hypertension Mother    . Hypertension Sister   . Hypertension Maternal Grandmother     Social History   Tobacco Use  . Smoking status: Current Every Day Smoker    Packs/day: 0.50    Years: 3.00    Pack years: 1.50    Types: Cigarettes  . Smokeless tobacco: Never Used  Vaping Use  . Vaping Use: Never used  Substance Use Topics  . Alcohol use: Yes    Comment: occisonally   . Drug use: Not Currently    Types: Marijuana    Home Medications Prior to Admission medications   Medication Sig Start Date End Date Taking? Authorizing Provider  naproxen (NAPROSYN) 500 MG tablet Take 1 tablet (500 mg total) by mouth 2 (two) times daily. 05/21/20  Yes Aerith Canal, Druscilla Brownie, PA-C  acetaminophen (TYLENOL) 500 MG tablet Take 1,000 mg by mouth every 6 (six) hours as needed for fever or headache.    [provider]  dicyclomine (BENTYL) 20 MG tablet Take 1 tablet (20 mg total) by mouth 2 (two) times daily. 04/04/20   Henderly, Britni A, PA-C  doxycycline (VIBRAMYCIN) 100 MG capsule Take 1 capsule (100 mg total) by mouth 2 (two) times daily for 7 days. 05/20/20 05/27/20  Tedd Sias, PA  metroNIDAZOLE (FLAGYL) 500 MG tablet Take 1 tablet (500 mg total) by mouth 2 (two) times daily. 09/06/19   Ladell Pier, MD  naproxen (NAPROSYN) 500 MG tablet Take 1 tablet (500 mg total) by mouth 2 (two) times daily.  Prn pain 04/25/20   Argentina Donovan, PA-C  SUMAtriptan (IMITREX) 50 MG tablet Take 1 tablet at the start of the headache.  May repeat in 2 hours if headache does not resolve.  Max 2 hours / 24-hour. 09/04/19   Ladell Pier, MD    Allergies    Patient has no known allergies.  Review of Systems   Review of Systems  Constitutional: Negative for chills and fever.  Respiratory: Negative for cough and shortness of breath.   Cardiovascular: Negative for chest pain.  Skin: Positive for wound (tender left axilla).  All other systems reviewed and are negative.   Physical Exam Updated Vital Signs BP (!)  149/98   Pulse 83   Temp 98.9 F (37.2 C) (Oral)   Resp 18   LMP 04/21/2020   SpO2 99%   Physical Exam Vitals and nursing note reviewed.  Constitutional:      General: She is not in acute distress.    Appearance: She is not ill-appearing.  HENT:     Head: Normocephalic.  Eyes:     Pupils: Pupils are equal, round, and reactive to light.  Cardiovascular:     Rate and Rhythm: Normal rate and regular rhythm.     Pulses: Normal pulses.     Heart sounds: Normal heart sounds. No murmur heard. No friction rub. No gallop.   Pulmonary:     Effort: Pulmonary effort is normal.     Breath sounds: Normal breath sounds.  Abdominal:     General: Abdomen is flat. There is no distension.     Palpations: Abdomen is soft.     Tenderness: There is no abdominal tenderness. There is no guarding or rebound.  Musculoskeletal:        General: Normal range of motion.     Cervical back: Neck supple.  Skin:    General: Skin is warm and dry.     Comments: Tenderness throughout left axilla region. 2x2cm area of induration in center of axilla with no overlying erythema or warmth. 2x3cm area of induration above axillary crease. No fluctuance in either location. Bedside ultrasound performed with demonstrated some cobblestoning, but no fluid collection.  Neurological:     General: No focal deficit present.     Mental Status: She is alert.  Psychiatric:        Mood and Affect: Mood normal.        Behavior: Behavior normal.     ED Results / Procedures / Treatments   Labs (all labs ordered are listed, but only abnormal results are displayed) Labs Reviewed - No data to display  EKG None  Radiology No results found.  Procedures Procedures   Medications Ordered in ED Medications  naproxen (NAPROSYN) tablet 500 mg (500 mg Oral Given 05/21/20 1818)    ED Course  I have reviewed the triage vital signs and the nursing notes.  Pertinent labs & imaging results that were available during my care of  the patient were reviewed by me and considered in my medical decision making (see chart for details).    MDM Rules/Calculators/A&P                         35 year old female presents to the ED due to left axilla tenderness for the past 2 to 3 weeks.  Patient evaluated in the ED on 05/19/2020 for same complaint as well as breast tenderness and discharged with doxycycline which patient has been compliant with.  No fever or chills. Patient returns today due to continued pain and edema. Triage noted patient to be tachycardic at 108; however during my initial evaluation patient's HR in the 80-90s. Patient in no acute distress. Low suspicion for sepsis. Performed bedside ultrasound which showed some cobblestoning, but no obvious fluid collection. No drainable abscess noted. Instructed patient to continue taking doxycycline as previous prescribed. Suspect symptoms related to early abscess formation/cellulitis vs. Lymphadenopathy.  Naproxen given for pain. Patient denies any chance of being pregnant. OBGYN number given at discharge for breast center referral due to breast tenderness as previous documented by provider on 1/23. Patient called PCP for referral, but unable to get an appointment until March. Instructed patient to either go to OBGYN or PCP for referral to breast center for mammogram due to breast tenderness. Strict ED precautions discussed with patient. Patient states understanding and agrees to plan. Patient discharged home in no acute distress and stable vitals.  Final Clinical Impression(s) / ED Diagnoses Final diagnoses:  Mass of left axilla    Rx / DC Orders ED Discharge Orders         Ordered    naproxen (NAPROSYN) 500 MG tablet  2 times daily        05/21/20 1840           Karie Kirks 05/21/20 1849    Margette Fast, MD 05/21/20 2317

## 2020-05-21 NOTE — ED Triage Notes (Signed)
Pt arrived via walk in, c/o multiple placed under left arm that are swollen and painful., was seen at [facility] ED yesterday for same, worsening today.

## 2020-05-21 NOTE — Telephone Encounter (Signed)
Pt calling back as left message that she has been told she needs to see a specialist re her breast. She does not mind if it is just a virtual phone call anything just so she can get a referral as this makes her anxious. [phone]

## 2020-05-22 ENCOUNTER — Encounter: Payer: Self-pay | Admitting: Family Medicine

## 2020-05-22 ENCOUNTER — Ambulatory Visit (INDEPENDENT_AMBULATORY_CARE_PROVIDER_SITE_OTHER): Payer: Self-pay | Admitting: Family Medicine

## 2020-05-22 VITALS — BP 144/98 | HR 86 | Wt 368.1 lb

## 2020-05-22 DIAGNOSIS — I10 Essential (primary) hypertension: Secondary | ICD-10-CM

## 2020-05-22 DIAGNOSIS — L02419 Cutaneous abscess of limb, unspecified: Secondary | ICD-10-CM

## 2020-05-22 DIAGNOSIS — D219 Benign neoplasm of connective and other soft tissue, unspecified: Secondary | ICD-10-CM

## 2020-05-22 HISTORY — DX: Essential (primary) hypertension: I10

## 2020-05-22 MED ORDER — IBUPROFEN 600 MG PO TABS: 600.0000 mg | ORAL_TABLET | Freq: Four times a day (QID) | ORAL | 2 refills | Status: DC | PRN

## 2020-05-22 NOTE — Assessment & Plan Note (Signed)
Mildly elevated BP's, review of recent visits show multiple hypertensive BP's. Not addressed at this visit, will message to let her know she needs to follow up with a PCP.

## 2020-05-22 NOTE — Assessment & Plan Note (Signed)
Bedside ultrasound demonstrated axillary abscess with fluid collection in the superior abnormality. Agree with prior providers that the other mass likely represents an inflamed lymph node in the setting of this infection. I&D performed with moderate amount of bloody purulent fluid expressed (see procedure note). Culture collected, plan to continue doxycycline. Given size will send for [place] to ensure no other concerning features are present though lacks any risk factors for malignancy.

## 2020-05-22 NOTE — Telephone Encounter (Signed)
Returned pt call. Pt states she was seen in the ED twice for a mass of left axilla. Pt states she had an appt with the GYN and she states the doctor wants to get a US of the mass first before the mammogram. Pt states she doesn't know know which way to go.

## 2020-05-22 NOTE — Patient Instructions (Signed)
Uterine Fibroids  Uterine fibroids are lumps of tissue (tumors) in the womb (uterus). Fibroids are not cancerous. Most women with this condition do not need treatment. Sometimes, fibroids can make it harder to have children. If this happens, you may need surgery to take out the fibroids. What are the causes? The cause of this condition is not known. What increases the risk?  You are in your 30s or 40s and have not gone through menopause. Menopause is when you have not had a menstrual period for 12 months.  Having a history of fibroids in your family.  You are of African American descent.  You started your period at age 4 or younger.  You have not given birth.  You are overweight or very overweight. What are the signs or symptoms?  Bleeding between menstrual periods.  Heavy bleeding during your menstrual period.  Pain in the area between your hips.  Needing to pee (urinate) right away or more often than usual.  Not being able to have children (infertility).  Not being able to stay pregnant (miscarriage). Many women do not have symptoms.  How is this treated? Treatment may include:  Follow-up visits with your doctor to check your fibroids for any changes.  Medicines to help with pain, such as aspirin or ibuprofen.  Hormone therapy. This may be given as a pill, in a shot, or with a type of birth control device called an IUD.  Surgery that would do one of these things: ? Take out the fibroids. This may be done if you want to become pregnant. ? Take out the womb (hysterectomy). ? Stop the blood flow to the fibroids. Follow these instructions at home: Medicines  Take over-the-counter and prescription medicines only as told by your doctor.  Ask your doctor if you should: ? Take iron pills. ? Eat more foods that have a lot of iron in them, such as dark green, leafy vegetables. Managing pain If told, put heat on your back or belly. Do this as often as told by your  doctor. Use the heat source that your doctor recommends, such as a moist heat pack or a heating pad. To do this:  Put a towel between your skin and the heat pack or pad.  Leave the heat on for 20-30 minutes.  Take off the heat if your skin turns bright red. This is very important. If you cannot feel pain, heat, or cold, you may have a greater risk of getting burned.   General instructions  Tell your doctor about any changes to your menstrual period, such as: ? Heavy bleeding that needs a change of tampons or pads more than normal. ? A change in how many days your period lasts. ? A change in symptoms that come with your period. This might be belly cramps or back pain.  Keep all follow-up visits. Contact a doctor if:  You have pain that does not get better with medicine or heat. This may include pain or cramps in: ? The area between your hip bones. ? Your back. ? Your belly.  You have new bleeding between your periods.  You have more bleeding during or between your periods.  You feel very tired or weak.  You feel dizzy. Get help right away if:  You faint.  You have pain in the area between your hip bones that gets worse.  You have bleeding that soaks a tampon or pad in 30 minutes or less. Summary  Uterine fibroids are lumps of  tissue (tumors) in your womb. They are not cancerous.  Medicines such as aspirin or ibuprofen may be used to help with pain.  Contact a doctor if you have pain or cramps that do not get better with medicine.  Know the symptoms for when you should get help right away. This information is not intended to replace advice given to you by your health care provider. Make sure you discuss any questions you have with your health care provider. Document Revised: 11/14/2019 Document Reviewed: 11/14/2019 Elsevier Patient Education  2021 Elsevier Inc.  

## 2020-05-22 NOTE — Assessment & Plan Note (Signed)
Patient imaging reviewed, notable for two large fibroids in the fundus and LUS. Consistent with patient's symptoms the LUS fibroid is visibly impinging on the bladder. Discussed with patient that her symptoms and wishes are unfortunately at cross purposes - to relieve her symptoms of menorrhagia, dysmenorrhea, and fibroid bulk she would need to either use hormonal methods or surgical treatments, both of which will affect her fertility. Explained that this is not my general area of expertise and I will refer her to a GYN colleague who can better guide her in decision making with regard to management of her fibroids and fertility.

## 2020-05-22 NOTE — Progress Notes (Signed)
GYNECOLOGY OFFICE VISIT NOTE  History:   Judy Hanna is a 35 y.o. G3P0030 here today for multiple issues.  Patient would like to get pregnant but is concerned about her fibroids Also feels like her fibroids are impinging on her bladder and causing her to difficulty with urinary continence Endorses irregular, heavy, and painful periods Wondering if she needs to get her fibroids removed so that she can get pregnant   Also worried about swelling in her L axilla Seen in [facility] ED for the same issue on 05/19/2020, at that time reported present for 3 weeks, exam showed two areas of inflammation with one having an enlarged lymph node, bedside US of the other area showed no fluid collection. She was started on a 7d course of doxycycline Returned to ED yesterday, bedside US with cobblestoning but no drainable collection, given naproxen and instructed to cont w doxycycline Notes also mention breast pain  Today patient did not endorse any current concerns with her breasts Confirmed the above history Denied any prior abscesses in her axilla or groin area Ongoing pain and swelling in her L axilla Has been doing soaks but nothing has been draining   No past medical history on file.  No past surgical history on file.  The following portions of the patient's history were reviewed and updated as appropriate: allergies, current medications, past family history, past medical history, past social history, past surgical history and problem list.   Health Maintenance:  Normal pap and negative HRHPV: 09/04/2019.  Normal mammogram: n/a.   Review of Systems:  Pertinent items noted in HPI and remainder of comprehensive ROS otherwise negative.  Physical Exam:  BP (!) 144/98   Pulse 86   Wt (!) 368 lb 1.6 oz (167 kg)   BMI 54.36 kg/m  CONSTITUTIONAL: Well-developed, well-nourished female in no acute distress.  HEENT:  Normocephalic, atraumatic. External right and left ear normal. No scleral icterus.   NECK: Normal range of motion, supple, no masses noted on observation SKIN: No rash noted. Not diaphoretic. No erythema. No pallor. AXILLA: two areas of swelling, first inferior to axillary fold and ~2-3 cm, firm and round. Second area superior to axillary fold with mild fluctuance, bedside US demonstrating small pocket of fluid MUSCULOSKELETAL: Normal range of motion. No edema noted. NEUROLOGIC: Alert and oriented to person, place, and time. Normal muscle tone coordination.  PSYCHIATRIC: Normal mood and affect. Normal behavior. Normal judgment and thought content. RESPIRATORY: Effort normal, no problems with respiration noted  Labs and Imaging No results found for this or any previous visit (from the past 168 hour(s)). No results found.    Assessment and Plan:   Problem List Items Addressed This Visit      Cardiovascular and Mediastinum   Essential hypertension    Mildly elevated BP's, review of recent visits show multiple hypertensive BP's. Not addressed at this visit, will message to let her know she needs to follow up with a PCP.         Other   Fibroids - Primary    Patient imaging reviewed, notable for two large fibroids in the fundus and LUS. Consistent with patient's symptoms the LUS fibroid is visibly impinging on the bladder. Discussed with patient that her symptoms and wishes are unfortunately at cross purposes - to relieve her symptoms of menorrhagia, dysmenorrhea, and fibroid bulk she would need to either use hormonal methods or surgical treatments, both of which will affect her fertility. Explained that this is not my general area  of expertise and I will refer her to a GYN colleague who can better guide her in decision making with regard to management of her fibroids and fertility.       Relevant Medications   ibuprofen (ADVIL) 600 MG tablet   Axillary abscess    Bedside ultrasound demonstrated axillary abscess with fluid collection in the superior abnormality. Agree with  prior providers that the other mass likely represents an inflamed lymph node in the setting of this infection. I&D performed with moderate amount of bloody purulent fluid expressed (see procedure note). Culture collected, plan to continue doxycycline. Given size will send for [place] to ensure no other concerning features are present though lacks any risk factors for malignancy.       Relevant Orders   Wound culture   [place] AXILLA LEFT      Routine preventative health maintenance measures emphasized. Please refer to After Visit Summary for other counseling recommendations.   Return in about 4 weeks (around 06/19/2020), or discuss fibroids and fertility.    Total face-to-face time with patient: 25 minutes.  Over 50% of encounter was spent on counseling and coordination of care.   Clarnce Flock, MD/[facility] Center for [employer], [place]

## 2020-05-22 NOTE — Telephone Encounter (Signed)
Please contact pt and schedule  

## 2020-05-22 NOTE — Telephone Encounter (Signed)
Called Patient to schedule appt no answer and unable to leave a VM.

## 2020-05-23 NOTE — Telephone Encounter (Signed)
[facility] schedule Pt with Judy Hanna on 06-05-20

## 2020-05-25 LAB — WOUND CULTURE: Organism ID, Bacteria: NONE SEEN

## 2020-05-27 ENCOUNTER — Other Ambulatory Visit: Payer: Self-pay | Admitting: Family Medicine

## 2020-05-27 MED ORDER — SULFAMETHOXAZOLE-TRIMETHOPRIM 800-160 MG PO TABS: 1.0000 | ORAL_TABLET | Freq: Two times a day (BID) | ORAL | 0 refills | Status: DC

## 2020-05-27 MED FILL — SULFAMETHOXAZOLE-TMP DS TAB: 800-160 | 7 days supply | Qty: 14 | Fill #0

## 2020-05-27 MED FILL — NAPROXEN 500 MG TABLET: 500 | 30 days supply | Qty: 60 | Fill #0

## 2020-05-27 NOTE — Addendum Note (Signed)
Addended by: Clayton Lefort on: 05/27/2020 10:31 AM   Modules accepted: Orders

## 2020-06-05 ENCOUNTER — Other Ambulatory Visit: Payer: Self-pay

## 2020-06-05 ENCOUNTER — Telehealth (INDEPENDENT_AMBULATORY_CARE_PROVIDER_SITE_OTHER): Payer: Self-pay | Admitting: Primary Care

## 2020-06-05 DIAGNOSIS — L02419 Cutaneous abscess of limb, unspecified: Secondary | ICD-10-CM

## 2020-06-05 NOTE — Progress Notes (Signed)
Virtual Visit via Telephone Note  I connected with Judy Hanna on 06/05/20 at  9:30 AM EST by telephone and verified that I am speaking with the correct person using two identifiers.  Location: Patient: home Provider: Kerin Perna working from home   I discussed the limitations, risks, security and privacy concerns of performing an evaluation and management service by telephone and the availability of in person appointments. I also discussed with the patient that there may be a patient responsible charge related to this service. The patient expressed understanding and agreed to proceed.   History of Present Illness: Judy Hanna is a 35 year old female PCP Dr. Wynetta Emery following today for an acute visit. Seen in ED on the 05/21/20 worsening pain left axilla region x2 to 3 weeks. Treated with antibiotics.  Today she voices concerns with the mass in the axicilla area was had the ID remains firm and soft. There are 2 other areas of concern are firm .  No past medical history on file.  Current Outpatient Medications on File Prior to Visit  Medication Sig Dispense Refill  . acetaminophen (TYLENOL) 500 MG tablet Take 1,000 mg by mouth every 6 (six) hours as needed for fever or headache.    . dicyclomine (BENTYL) 20 MG tablet Take 1 tablet (20 mg total) by mouth 2 (two) times daily. (Patient not taking: Reported on 05/22/2020) 20 tablet 0  . ibuprofen (ADVIL) 600 MG tablet Take 1 tablet (600 mg total) by mouth every 6 (six) hours as needed. 90 tablet 2  . naproxen (NAPROSYN) 500 MG tablet Take 1 tablet (500 mg total) by mouth 2 (two) times daily. 30 tablet 0  . SUMAtriptan (IMITREX) 50 MG tablet Take 1 tablet at the start of the headache.  May repeat in 2 hours if headache does not resolve.  Max 2 hours / 24-hour. (Patient not taking: Reported on 05/22/2020) 10 tablet 1   No current facility-administered medications on file prior to visit.   Observations/Objective: Pertinent positive and  negative listed in HPI  Assessment and Plan: Diagnoses and all orders for this visit:  Axillary abscess Several (total of 3) in left  axicilla area instructed patient to go to [facility] and ask for a BCCP form for a mammogram( pt is uninsured) order place for mammogram and f/u with PCP first available.  Patient is to completed application for [facility] while in clinic and application has and faxed to [facility]. Patient aware that [facility] will contact her directly to schedule appointment.   Follow Up Instructions:    I discussed the assessment and treatment plan with the patient. The patient was provided an opportunity to ask questions and all were answered. The patient agreed with the plan and demonstrated an understanding of the instructions.   The patient was advised to call back or seek an in-person evaluation if the symptoms worsen or if the condition fails to improve as anticipated.  I provided 15 minutes of non-face-to-face time during this encounter.   Kerin Perna, NP

## 2020-06-26 ENCOUNTER — Encounter: Payer: Self-pay | Admitting: Obstetrics and Gynecology

## 2020-06-26 ENCOUNTER — Ambulatory Visit (INDEPENDENT_AMBULATORY_CARE_PROVIDER_SITE_OTHER): Payer: Self-pay | Admitting: Obstetrics and Gynecology

## 2020-06-26 ENCOUNTER — Other Ambulatory Visit: Payer: Self-pay

## 2020-06-26 VITALS — BP 140/85 | HR 97 | Ht 69.0 in | Wt 371.1 lb

## 2020-06-26 DIAGNOSIS — I10 Essential (primary) hypertension: Secondary | ICD-10-CM

## 2020-06-26 DIAGNOSIS — Z6841 Body Mass Index (BMI) 40.0 and over, adult: Secondary | ICD-10-CM | POA: Insufficient documentation

## 2020-06-26 DIAGNOSIS — D219 Benign neoplasm of connective and other soft tissue, unspecified: Secondary | ICD-10-CM

## 2020-06-26 DIAGNOSIS — L02419 Cutaneous abscess of limb, unspecified: Secondary | ICD-10-CM

## 2020-06-26 DIAGNOSIS — R350 Frequency of micturition: Secondary | ICD-10-CM

## 2020-06-26 DIAGNOSIS — Z720 Tobacco use: Secondary | ICD-10-CM

## 2020-06-26 DIAGNOSIS — N393 Stress incontinence (female) (male): Secondary | ICD-10-CM

## 2020-06-26 HISTORY — DX: Body Mass Index (BMI) 40.0 and over, adult: Z684

## 2020-06-26 NOTE — Progress Notes (Signed)
Obstetrics and Gynecology Visit Return Patient Evaluation  Appointment Date: 06/26/2020  Primary Care Provider: [facility]: [facility] for [facility]  Chief Complaint: discuss fibroids  History of Present Illness:  Judy Hanna is a 35 y.o. G3P0030 (LMP 2/27) with above CC. PMHx significant for h/o EAB x 3 with one or two D&Cs, BMI 50s, tobacco abuse, HTN  Patient seen by [facility] Medicine Dr. Comer Locket and appt made with GYN to discuss her questions  Fibroids: pt wondering if they will affect her fertility. Her periods are qmonth, regular, 5 days and not heavy or painful and no intermenstrual bleeding. No h/o STDs. Pap neg 2021. Patient not on anything for birth control or period control. She had three elective ABs with last one during her teens. She has not been trying to get pregnant but she is wondering about the fibroids and fertility  Urinary issues: pt wondering if fibroids are causing her to have some occasional urinary frequency and SUI. ED UCx negative.   Axilla: pt still having issues with this.   Review of Systems: as noted in the History of Present Illness.   Patient Active Problem List   Diagnosis Date Noted  . BMI 50.0-59.9, adult ([place]) 06/26/2020  . Fibroids 05/22/2020  . Axillary abscess 05/22/2020  . Essential hypertension 05/22/2020  . Positive depression screening 06/05/2019  . Morbid obesity ([place]) 06/05/2019  . Tobacco dependence 06/05/2019  . Menstrual migraine without status migrainosus, not intractable 06/05/2019  . Dysmenorrhea 06/05/2019  . History of abnormal cervical Pap smear 06/05/2019   Medications:  None  Allergies: has No Known Allergies.  Physical Exam:  BP 140/85   Pulse 97   Ht 5\' 9"  (1.753 m)   Wt (!) 371 lb 1.6 oz (168.3 kg)   LMP 06/23/2020 (Exact Date)   BMI 54.80 kg/m  Body mass index is 54.8 kg/m. General appearance: Well nourished, well developed female in no acute distress.   Neuro/Psych:  Normal mood and affect.     Radiology: Narrative & Impression  CLINICAL DATA:  Initial evaluation for acute left lower quadrant pain.  EXAM: TRANSABDOMINAL AND TRANSVAGINAL ULTRASOUND OF PELVIS  DOPPLER ULTRASOUND OF OVARIES  TECHNIQUE: Both transabdominal and transvaginal ultrasound examinations of the pelvis were performed. Transabdominal technique was performed for global imaging of the pelvis including uterus, ovaries, adnexal regions, and pelvic cul-de-sac.  It was necessary to proceed with endovaginal exam following the transabdominal exam to visualize the uterus, endometrium, and ovaries. Color and duplex Doppler ultrasound was utilized to evaluate blood flow to the ovaries.  COMPARISON:  None available.  FINDINGS: Uterus  Measurements: 14.9 x 7.4 x 9.1 cm = volume: 524.2 mL. 8.8 x 6.5 x 8.5 cm exophytic fibroid extends from the right uterine fundus. Additional large fibroid centered at the left uterine body measures 7.2 x 6.7 x 7.7 cm  Endometrium  Thickness: 8.9 mm. Endometrial stripe somewhat difficult to visualize due to overlying fibroids. No definite focal abnormality.  Right ovary  Measurements: 3.1 x 1.9 x 2.9 cm = volume: 8.5 mL. Normal appearance/no adnexal mass.  Left ovary  Measurements: 3.2 x 3.0 x 3.2 cm = volume: 50.9 mL. 2.0 x 1.2 x 1.8 cm simple cyst, most consistent with a normal physiologic follicular cyst/dominant follicle.  Pulsed Doppler evaluation of both ovaries demonstrates normal low-resistance arterial and venous waveforms.  Other findings  No abnormal free fluid.  IMPRESSION: 1. Enlarged fibroid uterus as detailed above. 2. 2 cm simple left ovarian  cyst, most consistent with a normal physiologic follicular cyst/dominant follicle. No followup imaging recommended. note: This recommendation does not apply to premenarchal patients or to those with increased risk (genetic, family history,  elevated tumor markers or other high-risk factors) of ovarian cancer. Reference: Radiology 2019 Nov; 293(2):359-371. 3. No evidence for ovarian torsion or other acute abnormality.   Electronically Signed   By: Jeannine Boga M.D.   On: 04/04/2020 19:28    Narrative & Impression  CLINICAL DATA:  Left lower quadrant pain.  EXAM: CT ABDOMEN AND PELVIS WITHOUT CONTRAST  TECHNIQUE: Multidetector CT imaging of the abdomen and pelvis was performed following the standard protocol without IV contrast.  COMPARISON:  Pelvic ultrasound, dated April 04, 2020  FINDINGS: Lower chest: No acute abnormality.  Hepatobiliary: No focal liver abnormality is seen. No gallstones, gallbladder wall thickening, or biliary dilatation.  Pancreas: Unremarkable. No pancreatic ductal dilatation or surrounding inflammatory changes.  Spleen: Normal in size without focal abnormality.  Adrenals/Urinary Tract: Adrenal glands are unremarkable. Kidneys are normal, without renal calculi, focal lesion, or hydronephrosis. Bladder is unremarkable.  Stomach/Bowel: Stomach is within normal limits. Appendix appears normal. No evidence of bowel wall thickening, distention, or inflammatory changes.  Vascular/Lymphatic: No significant vascular findings are present. No enlarged abdominal or pelvic lymph nodes.  Reproductive: A 9.2 cm x 7.6 cm isodense mass is seen within the uterine fundus. A similar appearing 9.3 cm x 7.4 cm isodense mass is noted within the expected region of the lower uterine segment. A 2.6 cm x 2.6 cm well-defined area of low attenuation (approximately 11.16 Hounsfield units) is seen along the superior aspect of the left adnexa (axial CT image 72, CT series number 3/coronal reformatted image 50, CT series number 6).  Other: A 2.7 cm x 2.9 cm fat containing umbilical hernia is seen. No abdominopelvic ascites.  Musculoskeletal: Moderate severity degenerative changes  seen at the level of L5-S1.  IMPRESSION: 1. Findings likely consistent with large noncalcified uterine fibroids. This corresponds to the findings seen on the prior pelvic ultrasound. 2. Additional findings along the left adnexa which may correspond to the left ovarian cyst seen on the prior pelvic ultrasound. No follow-up imaging recommended. Note: This recommendation does not apply to premenarchal patients and to those with increased risk (genetic, family history, elevated tumor markers or other high-risk factors) of ovarian cancer. Reference: JACR 2020 Feb; 17(2):248-254 3. Degenerative changes at the level of L5-S1.   Electronically Signed   By: Virgina Norfolk M.D.   On: 04/04/2020 21:12    Assessment: pt stable  Plan:  1. Fibroid I d/w her that her fibroid likely won't affect her fertility but I waiting to see what happens whenever she decides to get pregnant as any surgical option has the ability to adversely affect her fertility  In terms of the urinary issues, it's possible the fibroids could be affecting it, but I recommend weight loss, tobacco cessation and trying pelvic floor PT first as the only option to help with the fibroids for this would be surgery, which has potential complications and issues, especially with her weight. Pt to let us know if she's interested in PT and to do the other options in the meantime and I recommend to keep her bladder empty with timed voiding every hour.   2. Urinary frequency  3. Stress incontinence of urine  4. Tobacco abuse  5. BMI 50.0-59.9, adult ([place])  6. Axillary abscess I told her it looks like she has HTN so I recommend  she touch base with her PCP re: this and for the axillary issue as well  7. HTN See above   RTC: PRN  Durene Romans MD Attending Center for [facility])

## 2020-07-08 ENCOUNTER — Encounter: Payer: Self-pay | Admitting: Internal Medicine

## 2020-07-10 ENCOUNTER — Encounter: Payer: Self-pay | Admitting: Physician Assistant

## 2020-07-10 ENCOUNTER — Other Ambulatory Visit: Payer: Self-pay | Admitting: Physician Assistant

## 2020-07-10 ENCOUNTER — Ambulatory Visit: Payer: Self-pay | Attending: Physician Assistant | Admitting: Physician Assistant

## 2020-07-10 ENCOUNTER — Other Ambulatory Visit: Payer: Self-pay

## 2020-07-10 DIAGNOSIS — L02419 Cutaneous abscess of limb, unspecified: Secondary | ICD-10-CM

## 2020-07-10 DIAGNOSIS — R030 Elevated blood-pressure reading, without diagnosis of hypertension: Secondary | ICD-10-CM

## 2020-07-10 MED ORDER — MUPIROCIN 2 % EX OINT: 1.0000 "application " | TOPICAL_OINTMENT | Freq: Two times a day (BID) | CUTANEOUS | 1 refills | Status: DC

## 2020-07-10 NOTE — Progress Notes (Signed)
Patient ID: Judy Hanna, female   DOB: 12-07-1985, 35 y.o.   MRN: [id number] Virtual Visit via Telephone Note  I connected with Judy Hanna on 07/10/20 at  3:30 PM EDT by telephone and verified that I am speaking with the correct person using two identifiers.  Location: Patient: home Provider: [facility]  I discussed the limitations, risks, security and privacy concerns of performing an evaluation and management service by telephone and the availability of in person appointments. I also discussed with the patient that there may be a patient responsible charge related to this service. The patient expressed understanding and agreed to proceed.   History of Present Illness:  L axillary "lumps".  She has not noticed any breast lumps.  Occasionally these get infected and one one time had to have I&D.  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  No FH early breast CA.  Not actively infected but wants something if infection recurs  Also blood pressure has been 130-140/85 at last few visits.  She has not checked it OOO but does have a cuff at home.      Observations/Objective:  NAD.  A&Ox3   Assessment and Plan: 1. Axillary abscess - mupirocin ointment (BACTROBAN) 2 %; Apply 1 application topically 2 (two) times daily. X 7 days prn  Dispense: 22 g; Refill: 1 - US BREAST COMPLETE UNI LEFT INC AXILLA; Future  2. Elevated blood pressure reading Exercise, DASH diet, weight loss and check BP 3 to 5 times weekly and record.  Bring to next visit   Follow Up Instructions: See PCP in 2-3 months   I discussed the assessment and treatment plan with the patient. The patient was provided an opportunity to ask questions and all were answered. The patient agreed with the plan and demonstrated an understanding of the instructions.   The patient was advised to call back or seek an in-person evaluation if the symptoms worsen or if the condition fails to improve as anticipated.  I provided  14 minutes of non-face-to-face time during this encounter.   Freeman Caldron, PA-C

## 2020-07-17 ENCOUNTER — Ambulatory Visit: Payer: Self-pay

## 2020-07-22 ENCOUNTER — Other Ambulatory Visit: Payer: Self-pay | Admitting: Family Medicine

## 2020-07-22 DIAGNOSIS — L02419 Cutaneous abscess of limb, unspecified: Secondary | ICD-10-CM

## 2020-07-26 ENCOUNTER — Encounter: Payer: Self-pay | Admitting: Physician Assistant

## 2020-07-27 ENCOUNTER — Other Ambulatory Visit: Payer: Self-pay

## 2020-08-01 ENCOUNTER — Ambulatory Visit: Payer: Self-pay

## 2020-08-06 ENCOUNTER — Ambulatory Visit: Payer: Self-pay

## 2020-08-08 ENCOUNTER — Ambulatory Visit: Payer: Self-pay | Admitting: Physician Assistant

## 2020-08-18 ENCOUNTER — Other Ambulatory Visit: Payer: Self-pay

## 2020-08-18 ENCOUNTER — Emergency Department (HOSPITAL_COMMUNITY)
Admission: EM | Admit: 2020-08-18 | Discharge: 2020-08-19 | Discharge status: Against Medical Advice | Payer: Self-pay | Attending: Emergency Medicine | Admitting: Emergency Medicine

## 2020-08-18 ENCOUNTER — Encounter (HOSPITAL_COMMUNITY): Payer: Self-pay

## 2020-08-18 DIAGNOSIS — Z5321 Procedure and treatment not carried out due to patient leaving prior to being seen by health care provider: Secondary | ICD-10-CM | POA: Insufficient documentation

## 2020-08-18 DIAGNOSIS — L02412 Cutaneous abscess of left axilla: Secondary | ICD-10-CM | POA: Insufficient documentation

## 2020-08-18 NOTE — ED Triage Notes (Signed)
Emergency Medicine Provider Triage Evaluation Note  Judy Hanna , a 35 y.o. female  was evaluated in triage.  Pt complains of abscess under left axilla. History of same. No fever or chills. Has had an I&D in the past.   Review of Systems   Negative: fever  Physical Exam  BP (!) 133/98 (BP Location: Right Arm)   Pulse 88   Temp 99.3 F (37.4 C) (Oral)   Resp 19   SpO2 98%  Gen:   Awake, no distress   HEENT:  Atraumatic  Resp:  Normal effort  Cardiac:  Normal rate  Abd:   Nondistended, nontender  MSK:   Moves extremities without difficulty  Neuro:  Speech clear   Medical Decision Making  Medically screening exam initiated at 9:32 PM.  Appropriate orders placed.  Arley Garant was informed that the remainder of the evaluation will be completed by another provider, this initial triage assessment does not replace that evaluation, and the importance of remaining in the ED until their evaluation is complete.  Clinical Impression  Possible abscess vs. Lymph node.    Suzy Bouchard, [place] 08/18/20 2134

## 2020-08-18 NOTE — ED Triage Notes (Signed)
Abscess to left underarm. Pt reports this has been an ongoing thing and some days its bigger and it will drain. Denies any fevers and chills.

## 2020-08-19 ENCOUNTER — Encounter (HOSPITAL_COMMUNITY): Payer: Self-pay

## 2020-08-19 ENCOUNTER — Ambulatory Visit (HOSPITAL_COMMUNITY)
Admission: RE | Admit: 2020-08-19 | Discharge: 2020-08-19 | Disposition: A | Discharge status: Home / Self Care | Payer: Self-pay | Source: Ambulatory Visit | Attending: Emergency Medicine | Admitting: Emergency Medicine

## 2020-08-19 VITALS — BP 127/90 | HR 92 | Temp 99.7°F

## 2020-08-19 DIAGNOSIS — R590 Localized enlarged lymph nodes: Secondary | ICD-10-CM

## 2020-08-19 MED ORDER — MELOXICAM 15 MG PO TABS
15.0000 mg | ORAL_TABLET | Freq: Every day | ORAL | 0 refills | Status: DC
  Filled 2020-08-19: qty 30, 30d supply, fill #0

## 2020-08-19 NOTE — ED Provider Notes (Signed)
I attempted to see the patient upon being roomed electronically, but later discovered that patient was never located and is presumed to have eloped.   Montine Circle, PA-C 52/08/02 2336    Delora Fuel, MD 04/19/48 [phone]

## 2020-08-19 NOTE — ED Provider Notes (Signed)
[facility]    CSN: [id number] Arrival date & time: 08/19/20  1717      History   Chief Complaint Chief Complaint  Patient presents with  . APPOINTMENT: Abscess    HPI Judy Hanna is a 35 y.o. female.   Patient present with 2 to 3 nodules under left arm present for months. Areas are firm but patient concerned for infection. originally assessed in 04/2020. One nodules lanced and antibiotics taken. Area intermittently opens and drains. Denies current drainage, fever, chills. Nodules are tender, pain is radiating into arm. There is concern for lymph node involvement and PCP is involved. Wanting mammogram but has been a delay due to lack of insurance. Funding in process.    Past Medical History:  Diagnosis Date  . BMI 50.0-59.9, adult ([place]) 06/26/2020    Patient Active Problem List   Diagnosis Date Noted  . BMI 50.0-59.9, adult ([place]) 06/26/2020  . Tobacco abuse 06/26/2020  . Hypertension 06/26/2020  . Fibroids 05/22/2020  . Axillary abscess 05/22/2020  . Essential hypertension 05/22/2020  . Positive depression screening 06/05/2019  . Morbid obesity ([place]) 06/05/2019  . Tobacco dependence 06/05/2019  . Menstrual migraine without status migrainosus, not intractable 06/05/2019  . Dysmenorrhea 06/05/2019  . History of abnormal cervical Pap smear 06/05/2019    History reviewed. No pertinent surgical history.  OB History    Gravida  3   Para  0   Term  0   Preterm  0   AB  3   Living        SAB  0   IAB  3   Ectopic  0   Multiple      Live Births               Home Medications    Prior to Admission medications   Medication Sig Start Date End Date Taking? Authorizing Provider  meloxicam (MOBIC) 15 MG tablet Take 1 tablet (15 mg total) by mouth daily. 08/19/20  Yes Jeret Goyer, Leitha Schuller, NP  acetaminophen (TYLENOL) 500 MG tablet Take 1,000 mg by mouth every 6 (six) hours as needed for fever or headache.    [provider]  dicyclomine  (BENTYL) 20 MG tablet Take 1 tablet (20 mg total) by mouth 2 (two) times daily. Patient not taking: Reported on 05/22/2020 04/04/20   Henderly, Britni A, PA-C  ibuprofen (ADVIL) 600 MG tablet Take 1 tablet (600 mg total) by mouth every 6 (six) hours as needed. 05/22/20   Clarnce Flock, MD  mupirocin ointment (BACTROBAN) 2 % APPLY 1 APPLICATION TOPICALLY 2 (TWO) TIMES DAILY FOR 7 DAYS AS NEEDED. 07/10/20 07/10/21  Argentina Donovan, PA-C  naproxen (NAPROSYN) 500 MG tablet Take 1 tablet (500 mg total) by mouth 2 (two) times daily. 05/21/20   Suzy Bouchard, PA-C  SUMAtriptan (IMITREX) 50 MG tablet Take 1 tablet at the start of the headache.  May repeat in 2 hours if headache does not resolve.  Max 2 hours / 24-hour. Patient not taking: Reported on 05/22/2020 09/04/19   Ladell Pier, MD    Family History Family History  Problem Relation Age of Onset  . Hypertension Mother   . Hypertension Sister   . Hypertension Maternal Grandmother     Social History Social History   Tobacco Use  . Smoking status: Current Every Day Smoker    Packs/day: 0.50    Years: 3.00    Pack years: 1.50    Types: Cigarettes  .  Smokeless tobacco: Never Used  Vaping Use  . Vaping Use: Never used  Substance Use Topics  . Alcohol use: Yes    Comment: occisonally   . Drug use: Not Currently    Types: Marijuana     Allergies   Patient has no known allergies.   Review of Systems Review of Systems  Constitutional: Negative.   HENT: Negative.   Respiratory: Negative.   Cardiovascular: Negative.   Skin: Negative.   Neurological: Negative.      Physical Exam Triage Vital Signs ED Triage Vitals  Enc Vitals Group     BP 08/19/20 1757 127/90     Pulse Rate 08/19/20 1757 92     Resp --      Temp 08/19/20 1757 99.7 F (37.6 C)     Temp Source 08/19/20 1757 Oral     SpO2 08/19/20 1757 100 %     Weight --      Height --      Head Circumference --      Peak Flow --      Pain Score 08/19/20  1755 6     Pain Loc --      Pain Edu? --      Excl. in [place]? --    No data found.  Updated Vital Signs BP 127/90 (BP Location: Left Arm)   Pulse 92   Temp 99.7 F (37.6 C) (Oral)   LMP 08/14/2020   SpO2 100%   Visual Acuity Right Eye Distance:   Left Eye Distance:   Bilateral Distance:    Right Eye Near:   Left Eye Near:    Bilateral Near:     Physical Exam Constitutional:      Appearance: Normal appearance. She is obese.  HENT:     Head: Normocephalic.  Eyes:     Extraocular Movements: Extraocular movements intact.  Cardiovascular:     Rate and Rhythm: Normal rate and regular rhythm.     Pulses: Normal pulses.     Heart sounds: Normal heart sounds.  Pulmonary:     Effort: Pulmonary effort is normal.     Breath sounds: Normal breath sounds.  Chest:  Breasts:     Left: Axillary adenopathy present.    Musculoskeletal:     Cervical back: Normal range of motion and neck supple.  Lymphadenopathy:     Upper Body:     Left upper body: Axillary adenopathy present.  Skin:    Comments: 2 firm nodules present in center of left mid axilla   Neurological:     Mental Status: She is alert and oriented to person, place, and time. Mental status is at baseline.  Psychiatric:        Mood and Affect: Mood normal.        Behavior: Behavior normal.        Thought Content: Thought content normal.        Judgment: Judgment normal.      UC Treatments / Results  Labs (all labs ordered are listed, but only abnormal results are displayed) Labs Reviewed - No data to display  EKG   Radiology No results found.  Procedures Procedures (including critical care time)  Medications Ordered in UC Medications - No data to display  Initial Impression / Assessment and Plan / UC Course  I have reviewed the triage vital signs and the nursing notes.  Pertinent labs & imaging results that were available during my care of the patient were reviewed by me  and considered in my medical  decision making (see chart for details).  Axillary lymphadenopathy  1. mobic 15 mg daily 2. Follow up with PCP regarding mammogram Final Clinical Impressions(s) / UC Diagnoses   Final diagnoses:  Axillary lymphadenopathy     Discharge Instructions     Can use meloxicam once a day, can use tylenol throughout day for additional relief  Very important that you get a mammogram. Stay on top of scholarship and scheduling, follow up with primary doctor for reevaluation of area if needed   ED Prescriptions    Medication Sig Dispense Auth. Provider   meloxicam (MOBIC) 15 MG tablet Take 1 tablet (15 mg total) by mouth daily. 30 tablet Hans Eden, NP     PDMP not reviewed this encounter.   Hans Eden, NP 08/19/20 [phone]

## 2020-08-19 NOTE — ED Triage Notes (Signed)
Pt presents with recurrent abscess under left arm for past few months.

## 2020-08-19 NOTE — ED Notes (Signed)
Pt called to be roomed no answer.

## 2020-08-19 NOTE — Discharge Instructions (Addendum)
Can use meloxicam once a day, can use tylenol throughout day for additional relief  Very important that you get a mammogram. Stay on top of scholarship and scheduling, follow up with primary doctor for reevaluation of area if needed

## 2020-08-20 ENCOUNTER — Other Ambulatory Visit: Payer: Self-pay

## 2020-08-25 ENCOUNTER — Other Ambulatory Visit: Payer: Self-pay

## 2020-08-25 ENCOUNTER — Emergency Department (HOSPITAL_COMMUNITY)
Admission: EM | Admit: 2020-08-25 | Discharge: 2020-08-25 | Disposition: A | Discharge status: Home / Self Care | Payer: Self-pay | Attending: Emergency Medicine | Admitting: Emergency Medicine

## 2020-08-25 ENCOUNTER — Emergency Department (HOSPITAL_COMMUNITY): Payer: Self-pay

## 2020-08-25 ENCOUNTER — Encounter (HOSPITAL_COMMUNITY): Payer: Self-pay | Admitting: Emergency Medicine

## 2020-08-25 DIAGNOSIS — I10 Essential (primary) hypertension: Secondary | ICD-10-CM | POA: Insufficient documentation

## 2020-08-25 DIAGNOSIS — L02412 Cutaneous abscess of left axilla: Secondary | ICD-10-CM | POA: Insufficient documentation

## 2020-08-25 DIAGNOSIS — L02419 Cutaneous abscess of limb, unspecified: Secondary | ICD-10-CM

## 2020-08-25 DIAGNOSIS — F17210 Nicotine dependence, cigarettes, uncomplicated: Secondary | ICD-10-CM | POA: Insufficient documentation

## 2020-08-25 DIAGNOSIS — R591 Generalized enlarged lymph nodes: Secondary | ICD-10-CM

## 2020-08-25 DIAGNOSIS — R590 Localized enlarged lymph nodes: Secondary | ICD-10-CM | POA: Insufficient documentation

## 2020-08-25 DIAGNOSIS — M79604 Pain in right leg: Secondary | ICD-10-CM

## 2020-08-25 DIAGNOSIS — M79661 Pain in right lower leg: Secondary | ICD-10-CM | POA: Insufficient documentation

## 2020-08-25 LAB — CBC WITH DIFFERENTIAL/PLATELET
Abs Immature Granulocytes: 0.04 10*3/uL (ref 0.00–0.07)
Basophils Absolute: 0 10*3/uL (ref 0.0–0.1)
Basophils Relative: 0 %
Eosinophils Absolute: 0.3 10*3/uL (ref 0.0–0.5)
Eosinophils Relative: 3 %
HCT: 41.6 % (ref 36.0–46.0)
Hemoglobin: 14.2 g/dL (ref 12.0–15.0)
Immature Granulocytes: 0 %
Lymphocytes Relative: 26 %
Lymphs Abs: 2.7 10*3/uL (ref 0.7–4.0)
MCH: 26.5 pg (ref 26.0–34.0)
MCHC: 34.1 g/dL (ref 30.0–36.0)
MCV: 77.6 fL — ABNORMAL LOW (ref 80.0–100.0)
Monocytes Absolute: 0.9 10*3/uL (ref 0.1–1.0)
Monocytes Relative: 8 %
Neutro Abs: 6.4 10*3/uL (ref 1.7–7.7)
Neutrophils Relative %: 63 %
Platelets: 375 10*3/uL (ref 150–400)
RBC: 5.36 MIL/uL — ABNORMAL HIGH (ref 3.87–5.11)
RDW: 15 % (ref 11.5–15.5)
WBC: 10.3 10*3/uL (ref 4.0–10.5)
nRBC: 0 % (ref 0.0–0.2)

## 2020-08-25 LAB — BASIC METABOLIC PANEL
Anion gap: 9 (ref 5–15)
BUN: 8 mg/dL (ref 6–20)
CO2: 25 mmol/L (ref 22–32)
Calcium: 8.9 mg/dL (ref 8.9–10.3)
Chloride: 104 mmol/L (ref 98–111)
Creatinine, Ser: 0.77 mg/dL (ref 0.44–1.00)
GFR, Estimated: >60 mL/min (ref >60)
Glucose, Bld: 105 mg/dL — ABNORMAL HIGH (ref 70–99)
Potassium: 3.5 mmol/L (ref 3.5–5.1)
Sodium: 138 mmol/L (ref 135–145)

## 2020-08-25 LAB — I-STAT BETA HCG BLOOD, ED (MC, WL, AP ONLY): I-stat hCG, quantitative: <5 m[IU]/mL (ref <5)

## 2020-08-25 LAB — D-DIMER, QUANTITATIVE: D-Dimer, Quant: <0.27 ug/mL-FEU (ref 0.00–0.50)

## 2020-08-25 MED ORDER — DOXYCYCLINE HYCLATE 100 MG PO TABS
100.0000 mg | ORAL_TABLET | Freq: Once | ORAL | Status: AC
  Administered 2020-08-25: 100 mg via ORAL
  Filled 2020-08-25: qty 1

## 2020-08-25 MED ORDER — NAPROXEN 500 MG PO TABS
500.0000 mg | ORAL_TABLET | Freq: Once | ORAL | Status: AC
  Administered 2020-08-25: 500 mg via ORAL
  Filled 2020-08-25: qty 1

## 2020-08-25 MED ORDER — DOXYCYCLINE HYCLATE 100 MG PO CAPS
100.0000 mg | ORAL_CAPSULE | Freq: Two times a day (BID) | ORAL | 0 refills | Status: DC
  Filled 2020-08-25: qty 14, 7d supply, fill #0

## 2020-08-25 NOTE — Discharge Instructions (Signed)
See your primary care doctor as discussed for optimal care.

## 2020-08-25 NOTE — ED Provider Notes (Signed)
[facility] DEPT Provider Note   CSN: [id number] Arrival date & time: 08/25/20  1830     History Chief Complaint  Patient presents with  . Abscess  . Foot Pain    Judy Hanna is a 35 y.o. female.  HPI     35 year old female comes in a chief complaint of foot pain and abscess.  Patient reports that over the past few days she has had pain in her heel that is radiating up her leg.  She is also complaining of left axillary abscess and bumps.  She is supposed to get mammogram done, in the past her abscess has been drained and also treated with antibiotics.  She reports that more recently she has had mild purulent and serosanguineous discharge from the prior abscess site. No history of DVT, PE  Past Medical History:  Diagnosis Date  . BMI 50.0-59.9, adult ([place]) 06/26/2020    Patient Active Problem List   Diagnosis Date Noted  . BMI 50.0-59.9, adult ([place]) 06/26/2020  . Tobacco abuse 06/26/2020  . Hypertension 06/26/2020  . Fibroids 05/22/2020  . Axillary abscess 05/22/2020  . Essential hypertension 05/22/2020  . Positive depression screening 06/05/2019  . Morbid obesity ([place]) 06/05/2019  . Tobacco dependence 06/05/2019  . Menstrual migraine without status migrainosus, not intractable 06/05/2019  . Dysmenorrhea 06/05/2019  . History of abnormal cervical Pap smear 06/05/2019    History reviewed. No pertinent surgical history.   OB History    Gravida  3   Para  0   Term  0   Preterm  0   AB  3   Living        SAB  0   IAB  3   Ectopic  0   Multiple      Live Births              Family History  Problem Relation Age of Onset  . Hypertension Mother   . Hypertension Sister   . Hypertension Maternal Grandmother     Social History   Tobacco Use  . Smoking status: Current Every Day Smoker    Packs/day: 0.50    Years: 3.00    Pack years: 1.50    Types: Cigarettes  . Smokeless tobacco: Never Used  Vaping Use  .  Vaping Use: Never used  Substance Use Topics  . Alcohol use: Yes    Comment: occisonally   . Drug use: Not Currently    Types: Marijuana    Home Medications Prior to Admission medications   Medication Sig Start Date End Date Taking? Authorizing Provider  doxycycline (VIBRAMYCIN) 100 MG capsule Take 1 capsule (100 mg total) by mouth 2 (two) times daily. 08/25/20  Yes Varney Biles, MD  acetaminophen (TYLENOL) 500 MG tablet Take 1,000 mg by mouth every 6 (six) hours as needed for fever or headache.    [provider]  dicyclomine (BENTYL) 20 MG tablet Take 1 tablet (20 mg total) by mouth 2 (two) times daily. Patient not taking: Reported on 05/22/2020 04/04/20   Henderly, Britni A, PA-C  ibuprofen (ADVIL) 600 MG tablet Take 1 tablet (600 mg total) by mouth every 6 (six) hours as needed. 05/22/20   Clarnce Flock, MD  meloxicam (MOBIC) 15 MG tablet Take 1 tablet (15 mg total) by mouth daily. 08/19/20   Hans Eden, NP  mupirocin ointment (BACTROBAN) 2 % APPLY 1 APPLICATION TOPICALLY 2 (TWO) TIMES DAILY FOR 7 DAYS AS NEEDED. 07/10/20 07/10/21  Freeman Caldron M, PA-C  naproxen (NAPROSYN) 500 MG tablet Take 1 tablet (500 mg total) by mouth 2 (two) times daily. 05/21/20   Suzy Bouchard, PA-C  SUMAtriptan (IMITREX) 50 MG tablet Take 1 tablet at the start of the headache.  May repeat in 2 hours if headache does not resolve.  Max 2 hours / 24-hour. Patient not taking: Reported on 05/22/2020 09/04/19   Ladell Pier, MD    Allergies    Patient has no known allergies.  Review of Systems   Review of Systems  Constitutional: Positive for activity change. Negative for fever.  Respiratory: Negative for shortness of breath.   Cardiovascular: Negative for chest pain.  Musculoskeletal: Positive for arthralgias and myalgias.  Skin: Positive for wound.  Allergic/Immunologic: Negative for immunocompromised state.  Hematological: Does not bruise/bleed easily.  All other systems  reviewed and are negative.   Physical Exam Updated Vital Signs BP 110/77   Pulse 97   Temp 97.8 F (36.6 C)   Resp 16   LMP 08/11/2020 (Approximate)   SpO2 95%   Physical Exam Vitals and nursing note reviewed.  Constitutional:      Appearance: She is well-developed.  HENT:     Head: Normocephalic and atraumatic.  Eyes:     Pupils: Pupils are equal, round, and reactive to light.  Cardiovascular:     Rate and Rhythm: Normal rate and regular rhythm.     Heart sounds: Normal heart sounds. No murmur heard.   Pulmonary:     Effort: Pulmonary effort is normal. No respiratory distress.  Abdominal:     General: There is no distension.     Palpations: Abdomen is soft.     Tenderness: There is no abdominal tenderness. There is no guarding or rebound.  Musculoskeletal:        General: Tenderness present. No swelling or deformity.     Cervical back: Neck supple.     Comments: Right leg has tenderness over the plantar surface of the foot and tibial and femur region.  No evidence of any erythema, edema.  Left axilla has 2, mobile nodules that are palpable.  Those lesions are tender to palpation.  Skin:    General: Skin is warm and dry.  Neurological:     Mental Status: She is alert and oriented to person, place, and time.     ED Results / Procedures / Treatments   Labs (all labs ordered are listed, but only abnormal results are displayed) Labs Reviewed  CBC WITH DIFFERENTIAL/PLATELET - Abnormal; Notable for the following components:      Result Value   RBC 5.36 (*)    MCV 77.6 (*)    All other components within normal limits  BASIC METABOLIC PANEL - Abnormal; Notable for the following components:   Glucose, Bld 105 (*)    All other components within normal limits  D-DIMER, QUANTITATIVE  I-STAT BETA HCG BLOOD, ED ([facility], [facility], [facility] ONLY)    EKG None  Radiology DG Tibia/Fibula Right  Result Date: 08/25/2020 CLINICAL DATA:  Initial evaluation for acute pain for 3 weeks, no  injury. EXAM: RIGHT TIBIA AND FIBULA - 2 VIEW COMPARISON:  None. FINDINGS: No acute fracture or dislocation. Osseous mineralization normal. No discrete osseous lesion. 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 No visible soft tissue abnormality. IMPRESSION: 1. No acute osseous abnormality about the right tibia/fibula. 2. 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 Electronically Signed   By: Jeannine Boga M.D.   On: 08/25/2020 20:22  Procedures Ultrasound ED Soft Tissue  Date/Time: 08/25/2020 9:44 PM Performed by: Varney Biles, MD Authorized by: Varney Biles, MD   Procedure details:    Indications: localization of abscess     Transverse view:  Visualized   Longitudinal view:  Visualized   Images: archived     Limitations:  Body habitus and positioning Location:    Location: axilla   Findings:     no abscess present    no cellulitis present    no foreign body present Comments:     2 nodules identified.  The small nodule appears to be a cystic lesion, superior to which there is a lesion that appears to be lymph node     Medications Ordered in ED Medications  naproxen (NAPROSYN) tablet 500 mg (500 mg Oral Given 08/25/20 2139)  doxycycline (VIBRA-TABS) tablet 100 mg (100 mg Oral Given 08/25/20 2139)    ED Course  I have reviewed the triage vital signs and the nursing notes.  Pertinent labs & imaging results that were available during my care of the patient were reviewed by me and considered in my medical decision making (see chart for details).    MDM Rules/Calculators/A&P                         35 year old female comes in a chief complaint of leg pain and axillary pain.  To the axilla, it appears that she is currently having abscess draining from a small lesion.  We will put her on antibiotics.  The other nodules appear to be cyst versus lymph node.  She is supposed to get mammogram because of persistent lymphadenopathy.  Advised outpatient follow-up for  this.  For the leg pain, dimer ordered and is negative.  Well score 0.  Strict ER return precautions have been discussed, and patient is agreeing with the plan and is comfortable with the workup done and the recommendations from the ER.   Final Clinical Impression(s) / ED Diagnoses Final diagnoses:  Lymphadenopathy  Axillary abscess  Pain of right lower extremity    Rx / DC Orders ED Discharge Orders         Ordered    doxycycline (VIBRAMYCIN) 100 MG capsule  2 times daily        08/25/20 2132           Varney Biles, MD 08/25/20 2146

## 2020-08-25 NOTE — ED Triage Notes (Signed)
Patient reports recurrent abscess to left axilla with drainage and right foot pain radiating up leg x3 weeks.

## 2020-08-26 ENCOUNTER — Other Ambulatory Visit: Payer: Self-pay

## 2020-08-27 ENCOUNTER — Other Ambulatory Visit: Payer: Self-pay

## 2020-08-27 MED FILL — Mupirocin Oint 2%: CUTANEOUS | 7 days supply | Qty: 22 | Fill #0 | Status: CN

## 2020-08-28 ENCOUNTER — Other Ambulatory Visit: Payer: Self-pay

## 2020-09-10 ENCOUNTER — Encounter: Payer: Self-pay | Admitting: Internal Medicine

## 2020-09-10 ENCOUNTER — Ambulatory Visit: Payer: Self-pay | Attending: Internal Medicine | Admitting: Internal Medicine

## 2020-09-10 ENCOUNTER — Other Ambulatory Visit: Payer: Self-pay

## 2020-09-10 VITALS — BP 127/79 | HR 86 | Resp 16 | Wt 379.4 lb

## 2020-09-10 DIAGNOSIS — L02419 Cutaneous abscess of limb, unspecified: Secondary | ICD-10-CM

## 2020-09-10 DIAGNOSIS — Z113 Encounter for screening for infections with a predominantly sexual mode of transmission: Secondary | ICD-10-CM

## 2020-09-10 NOTE — Progress Notes (Signed)
Patient ID: Judy Hanna, female    DOB: [birth year]  MRN: [id number]  CC: Hospitalization Follow-up (ED)   Subjective: Judy Hanna is a 35 y.o. female who presents for ER f/u Her concerns today include:  tob dep, obesity, fibroids, menstrual migraines, morbid obesity  Patient seen in the ER on the first of this month for abscess in the left axilla.  She was also concerned about 2 firm knots in the axilla.  Bedside ultrasound revealed 2 nodules.  The small nodule appeared to be a cystic lesion, superior to which there is a lesion that appeared to be a lymph node.  She was placed on doxycycline and told to follow-up with PCP.  Today, patient tells me that she has been dealing with the issue of abscess in the left axilla since January of this year.  It was initially I indeed in January of this year at her gynecologist office.  Site of the I&D closed for period of time and then reopened intermittently.  She stopped using deodorant.  Recently tried using baking soda under the arm and this caused the site to open up again and become painful.  She has noticed 2 small lumps at the drainage site.  Bedside ultrasound was done in the emergency room as mentioned above and the findings were also stated above.  Patient states she was told by our PA that she may need to have a mammogram.  Patient is uninsured. -Reports having abscesses on other parts of her body in the past like the upper thighs, inguinal area and buttocks but have not been recurrent like this.  Requesting screening for sexually transmitted disease.  Denies any vaginal discharge or irritation at this time.  She has a new partner as of 1 month ago.  Also noted some swelling in the legs.  No shortness of breath. Patient Active Problem List   Diagnosis Date Noted  . BMI 50.0-59.9, adult ([place]) 06/26/2020  . Tobacco abuse 06/26/2020  . Hypertension 06/26/2020  . Fibroids 05/22/2020  . Axillary abscess 05/22/2020  . Essential hypertension  05/22/2020  . Positive depression screening 06/05/2019  . Morbid obesity ([place]) 06/05/2019  . Tobacco dependence 06/05/2019  . Menstrual migraine without status migrainosus, not intractable 06/05/2019  . Dysmenorrhea 06/05/2019  . History of abnormal cervical Pap smear 06/05/2019     Current Outpatient Medications on File Prior to Visit  Medication Sig Dispense Refill  . acetaminophen (TYLENOL) 500 MG tablet Take 1,000 mg by mouth every 6 (six) hours as needed for fever or headache.    . dicyclomine (BENTYL) 20 MG tablet Take 1 tablet (20 mg total) by mouth 2 (two) times daily. (Patient not taking: Reported on 05/22/2020) 20 tablet 0  . doxycycline (VIBRAMYCIN) 100 MG capsule Take 1 capsule (100 mg total) by mouth 2 (two) times daily. 14 capsule 0  . ibuprofen (ADVIL) 600 MG tablet Take 1 tablet (600 mg total) by mouth every 6 (six) hours as needed. 90 tablet 2  . meloxicam (MOBIC) 15 MG tablet Take 1 tablet (15 mg total) by mouth daily. 30 tablet 0  . mupirocin ointment (BACTROBAN) 2 % APPLY 1 APPLICATION TOPICALLY 2 (TWO) TIMES DAILY FOR 7 DAYS AS NEEDED. 22 g 1  . naproxen (NAPROSYN) 500 MG tablet Take 1 tablet (500 mg total) by mouth 2 (two) times daily. 30 tablet 0  . SUMAtriptan (IMITREX) 50 MG tablet Take 1 tablet at the start of the headache.  May repeat in 2 hours if  headache does not resolve.  Max 2 hours / 24-hour. (Patient not taking: Reported on 05/22/2020) 10 tablet 1   No current facility-administered medications on file prior to visit.    No Known Allergies  Social History   Socioeconomic History  . Marital status: Single    Spouse name: Not on file  . Number of children: 0  . Years of education: Not on file  . Highest education level: Some college, no degree  Occupational History  . Occupation: home health care  Tobacco Use  . Smoking status: Current Every Day Smoker    Packs/day: 0.50    Years: 3.00    Pack years: 1.50    Types: Cigarettes  . Smokeless tobacco:  Never Used  Vaping Use  . Vaping Use: Never used  Substance and Sexual Activity  . Alcohol use: Yes    Comment: occisonally   . Drug use: Not Currently    Types: Marijuana  . Sexual activity: Yes    Partners: Female  Other Topics Concern  . Not on file  Social History Narrative   ** Merged History Encounter **       Social Determinants of Health   Financial Resource Strain: Not on file  Food Insecurity: Not on file  Transportation Needs: Not on file  Physical Activity: Not on file  Stress: Not on file  Social Connections: Not on file  Intimate Partner Violence: Not on file    Family History  Problem Relation Age of Onset  . Hypertension Mother   . Hypertension Sister   . Hypertension Maternal Grandmother     No past surgical history on file.  ROS: Review of Systems Negative except as stated above  PHYSICAL EXAM: BP 127/79   Pulse 86   Resp 16   Wt (!) 379 lb 6.4 oz (172.1 kg)   LMP 08/14/2020   SpO2 100%   BMI 56.03 kg/m   Physical Exam  General appearance - alert, well appearing, young obese African-American female and in no distress Mental status - normal mood, behavior, speech, dress, motor activity, and thought processes Extremities -patient has large legs and ankles.  No significant pitting edema appreciated. Skin -left axilla: Patient has a 2 cm linear incision noted in the mid portion of the axilla.  She has 2 palpable small knots below this area that are tender to touch.  When I palpate these knots, small amount of serosanguineous with a little pus fluid expressed   CMP Latest Ref Rng & Units 08/25/2020 04/04/2020 06/05/2019  Glucose 70 - 99 mg/dL 105(H) 96 89  BUN 6 - 20 mg/dL 8 6 10   Creatinine 0.44 - 1.00 mg/dL 0.77 0.71 0.75  Sodium 135 - 145 mmol/L 138 137 142  Potassium 3.5 - 5.1 mmol/L 3.5 3.8 4.4  Chloride 98 - 111 mmol/L 104 102 107(H)  CO2 22 - 32 mmol/L 25 25 22   Calcium 8.9 - 10.3 mg/dL 8.9 9.0 9.2  Total Protein 6.5 - 8.1 g/dL - 6.8  6.6  Total Bilirubin 0.3 - 1.2 mg/dL - 0.8 0.4  Alkaline Phos 38 - 126 U/L - 57 63  AST 15 - 41 U/L - 13(L) 10  ALT 0 - 44 U/L - 16 8   Lipid Panel     Component Value Date/Time   CHOL 185 06/05/2019 1131   TRIG 63 06/05/2019 1131   HDL 58 06/05/2019 1131   CHOLHDL 3.2 06/05/2019 1131   LDLCALC 115 (H) 06/05/2019 1131  CBC    Component Value Date/Time   WBC 10.3 08/25/2020 1936   RBC 5.36 (H) 08/25/2020 1936   HGB 14.2 08/25/2020 1936   HGB 13.5 06/05/2019 1131   HCT 41.6 08/25/2020 1936   HCT 39.5 06/05/2019 1131   PLT 375 08/25/2020 1936   PLT 374 06/05/2019 1131   MCV 77.6 (L) 08/25/2020 1936   MCV 79 06/05/2019 1131   MCH 26.5 08/25/2020 1936   MCHC 34.1 08/25/2020 1936   RDW 15.0 08/25/2020 1936   RDW 15.0 06/05/2019 1131   LYMPHSABS 2.7 08/25/2020 1936   MONOABS 0.9 08/25/2020 1936   EOSABS 0.3 08/25/2020 1936   BASOSABS 0.0 08/25/2020 1936    ASSESSMENT AND PLAN:  1. Axillary abscess Chronic recurrent drainage since January of this year.  She may have an underlying sinus track while trying to develop hidradenitis.  I recommend that she apply for the orange card as soon as possible.  Once approved she should let me know so that we can submit a referral for her to see a surgeon  2. Screening for STD (sexually transmitted disease) - HIV antibody (with reflex); Future - RPR; Future - Cervicovaginal ancillary only  Reassurance given about the legs.  Recommend low-salt diet  Patient was given the opportunity to ask questions.  Patient verbalized understanding of the plan and was able to repeat key elements of the plan.   No orders of the defined types were placed in this encounter.    Requested Prescriptions    No prescriptions requested or ordered in this encounter    No follow-ups on file.  Karle Plumber, MD, FACP

## 2020-09-11 ENCOUNTER — Other Ambulatory Visit: Payer: Self-pay

## 2020-12-02 ENCOUNTER — Other Ambulatory Visit: Payer: Self-pay

## 2020-12-02 ENCOUNTER — Ambulatory Visit (HOSPITAL_BASED_OUTPATIENT_CLINIC_OR_DEPARTMENT_OTHER): Payer: Self-pay | Admitting: Internal Medicine

## 2020-12-02 ENCOUNTER — Encounter: Payer: Self-pay | Admitting: Internal Medicine

## 2020-12-02 DIAGNOSIS — R600 Localized edema: Secondary | ICD-10-CM

## 2020-12-02 DIAGNOSIS — F17200 Nicotine dependence, unspecified, uncomplicated: Secondary | ICD-10-CM

## 2020-12-02 DIAGNOSIS — Z113 Encounter for screening for infections with a predominantly sexual mode of transmission: Secondary | ICD-10-CM

## 2020-12-02 MED ORDER — NICOTINE 21 MG/24HR TD PT24
21.0000 mg | MEDICATED_PATCH | Freq: Every day | TRANSDERMAL | 1 refills | Status: DC
Start: 2020-12-02 — End: 2021-02-10
  Filled 2020-12-02: qty 28, 28d supply, fill #0

## 2020-12-02 NOTE — Progress Notes (Signed)
Virtual Visit via Telephone Note  I connected with Judy Hanna on 12/02/2020 at 9:57 AM by telephone and verified that I am speaking with the correct person using two identifiers  Location: Patient: home Provider: office  Participants: Myself Patient   I discussed the limitations, risks, security and privacy concerns of performing an evaluation and management service by telephone and the availability of in person appointments. I also discussed with the patient that there may be a patient responsible charge related to this service. The patient expressed understanding and agreed to proceed.   History of Present Illness: Patient with history of tobacco dependence, obesity, fibroids, menstrual migraines  Pt c/o swelling in both ankles and feet x 2 mths. Gets better when she lays down at nights and props her feet up.  When she wakes in mornings, the swelling is down but gets worse the more she is on her feet during the day. Had driven to [place] from [place] in early July.  She felt the swelling was worse since then.  She was seen at [facility] about 4 days after she returned and had negative Doppler ultrasound of the lower extremities. -Denies any shortness of breath. Has some varicose veins in thighs -Purchased some compression socks while in [place] and has been wearing them but so far they have not made a big difference. -Requesting to have some blood test done to see if it would shed any light on the lower extremity edema.  She continues to smoke.  She smokes about a half a pack a day.  She is smoked for the past 4 years.  She would like to quit.  In the past she had been able to stop for about 2 weeks at the most.  She feels she smokes because of increased stress.  Wants STI screening.  It was ordered on last visit but she did not get it done. No symptoms at this time.  Outpatient Encounter Medications as of 12/02/2020  Medication Sig   acetaminophen (TYLENOL) 500 MG tablet Take 1,000  mg by mouth every 6 (six) hours as needed for fever or headache.   dicyclomine (BENTYL) 20 MG tablet Take 1 tablet (20 mg total) by mouth 2 (two) times daily. (Patient not taking: Reported on 05/22/2020)   doxycycline (VIBRAMYCIN) 100 MG capsule Take 1 capsule (100 mg total) by mouth 2 (two) times daily.   ibuprofen (ADVIL) 600 MG tablet Take 1 tablet (600 mg total) by mouth every 6 (six) hours as needed.   meloxicam (MOBIC) 15 MG tablet Take 1 tablet (15 mg total) by mouth daily.   mupirocin ointment (BACTROBAN) 2 % APPLY 1 APPLICATION TOPICALLY 2 (TWO) TIMES DAILY FOR 7 DAYS AS NEEDED.   naproxen (NAPROSYN) 500 MG tablet Take 1 tablet (500 mg total) by mouth 2 (two) times daily.   SUMAtriptan (IMITREX) 50 MG tablet Take 1 tablet at the start of the headache.  May repeat in 2 hours if headache does not resolve.  Max 2 hours / 24-hour. (Patient not taking: Reported on 05/22/2020)   No facility-administered encounter medications on file as of 12/02/2020.      Observations/Objective: No direct observation done as this was a telephone encounter.   Chemistry      Component Value Date/Time   NA 138 08/25/2020 1936   NA 142 06/05/2019 1131   K 3.5 08/25/2020 1936   CL 104 08/25/2020 1936   CO2 25 08/25/2020 1936   BUN 8 08/25/2020 1936   BUN 10  06/05/2019 1131   CREATININE 0.77 08/25/2020 1936      Component Value Date/Time   CALCIUM 8.9 08/25/2020 1936   ALKPHOS 57 04/04/2020 1157   AST 13 (L) 04/04/2020 1157   ALT 16 04/04/2020 1157   BILITOT 0.8 04/04/2020 1157   BILITOT 0.4 06/05/2019 1131     Lab Results  Component Value Date   WBC 10.3 08/25/2020   HGB 14.2 08/25/2020   HCT 41.6 08/25/2020   MCV 77.6 (L) 08/25/2020   PLT 375 08/25/2020     Assessment and Plan: 1. Edema of both legs Sounds like dependent edema.  I recommend wearing compression socks consistently during the day.  We will check a BNP as a screening for CHF - Pro b natriuretic peptide; Future - Comprehensive  metabolic panel; Future  2. Tobacco dependence Pt is current smoker. Patient advised to quit smoking. Discussed health risks associated with smoking including lung and other types of cancers, chronic lung diseases and CV risks.. Pt ready to give trail of quitting.   Discussed methods to help quit including quitting cold [place], use of NRT, Chantix and Bupropion.  Pt wanting to try: Nicotine patches.  We will start with the 21 mg patch and do the stepdown approach.  I went over with her how to use the patches. _3_ Minutes spent on counseling. F/U: Assess progress on subsequent visit  - nicotine (NICODERM CQ - DOSED IN MG/24 HOURS) 21 mg/24hr patch; Place 1 patch (21 mg total) onto the skin daily.  Dispense: 28 patch; Refill: 1  3. Screen for sexually transmitted diseases - Hepatitis C Antibody; Future - HIV antibody (with reflex); Future - Cervicovaginal ancillary only   Follow Up Instructions: 2 mths or sooner as needed   I discussed the assessment and treatment plan with the patient. The patient was provided an opportunity to ask questions and all were answered. The patient agreed with the plan and demonstrated an understanding of the instructions.   The patient was advised to call back or seek an in-person evaluation if the symptoms worsen or if the condition fails to improve as anticipated.  I  Spent 16 minutes on this telephone encounter  Karle Plumber, MD

## 2020-12-04 NOTE — Addendum Note (Signed)
Addended bySteffanie Dunn on: 12/04/2020 04:37 PM   Modules accepted: Orders

## 2020-12-05 LAB — HEPATITIS C ANTIBODY: Hep C Virus Ab: <0.1 s/co ratio (ref 0.0–0.9)

## 2020-12-05 LAB — COMPREHENSIVE METABOLIC PANEL
ALT: 12 IU/L (ref 0–32)
AST: 13 IU/L (ref 0–40)
Albumin/Globulin Ratio: 1.5 (ref 1.2–2.2)
Albumin: 4.1 g/dL (ref 3.8–4.8)
Alkaline Phosphatase: 60 IU/L (ref 44–121)
BUN/Creatinine Ratio: 9 (ref 9–23)
BUN: 7 mg/dL (ref 6–20)
Bilirubin Total: 0.8 mg/dL (ref 0.0–1.2)
CO2: 20 mmol/L (ref 20–29)
Calcium: 9 mg/dL (ref 8.7–10.2)
Chloride: 105 mmol/L (ref 96–106)
Creatinine, Ser: 0.76 mg/dL (ref 0.57–1.00)
Globulin, Total: 2.7 g/dL (ref 1.5–4.5)
Glucose: 94 mg/dL (ref 65–99)
Potassium: 4.1 mmol/L (ref 3.5–5.2)
Sodium: 141 mmol/L (ref 134–144)
Total Protein: 6.8 g/dL (ref 6.0–8.5)
eGFR: 106 mL/min/{1.73_m2} (ref >59)

## 2020-12-05 LAB — RPR: RPR Ser Ql: NONREACTIVE

## 2020-12-05 LAB — PRO B NATRIURETIC PEPTIDE: NT-Pro BNP: 22 pg/mL (ref 0–130)

## 2020-12-05 LAB — HIV ANTIBODY (ROUTINE TESTING W REFLEX): HIV Screen 4th Generation wRfx: NONREACTIVE

## 2020-12-09 ENCOUNTER — Other Ambulatory Visit: Payer: Self-pay

## 2020-12-23 ENCOUNTER — Ambulatory Visit (INDEPENDENT_AMBULATORY_CARE_PROVIDER_SITE_OTHER): Payer: Self-pay | Admitting: Obstetrics and Gynecology

## 2020-12-23 ENCOUNTER — Other Ambulatory Visit: Payer: Self-pay

## 2020-12-23 ENCOUNTER — Encounter: Payer: Self-pay | Admitting: Obstetrics and Gynecology

## 2020-12-23 VITALS — BP 148/96 | HR 91 | Wt 377.4 lb

## 2020-12-23 DIAGNOSIS — N393 Stress incontinence (female) (male): Secondary | ICD-10-CM

## 2020-12-23 DIAGNOSIS — Z720 Tobacco use: Secondary | ICD-10-CM

## 2020-12-23 DIAGNOSIS — Z1331 Encounter for screening for depression: Secondary | ICD-10-CM

## 2020-12-23 DIAGNOSIS — N3946 Mixed incontinence: Secondary | ICD-10-CM

## 2020-12-23 DIAGNOSIS — Z6841 Body Mass Index (BMI) 40.0 and over, adult: Secondary | ICD-10-CM

## 2020-12-23 DIAGNOSIS — D219 Benign neoplasm of connective and other soft tissue, unspecified: Secondary | ICD-10-CM

## 2020-12-23 DIAGNOSIS — I10 Essential (primary) hypertension: Secondary | ICD-10-CM

## 2020-12-23 NOTE — Progress Notes (Signed)
Abdominal pain every day for the last few weeks; this began with last menstrual period.. Reports stress incontinence and increased urgency for past few months.   Apolonio Schneiders RN

## 2020-12-23 NOTE — BH Specialist Note (Signed)
Integrated Behavioral Health via Telemedicine Visit  12/23/2020 Orel Riga [id number]  Number of [place] visits: 1 Session Start time: 1:15  Session End time: 1:30 Total time: 15  Referring Provider: Aletha Halim, MD Patient/Family location: Home [facility] Provider location: Center for [facility]  All persons participating in visit: Patient Judy Hanna and [place]   Types of Service: Individual psychotherapy and Video visit  I connected with Judy Hanna and/or Judy Hanna's  n/a  via  Telephone or Geologist, engineering  (Video is Tree surgeon) and verified that I am speaking with the correct person using two identifiers. Discussed confidentiality: Yes   I discussed the limitations of telemedicine and the availability of in person appointments.  Discussed there is a possibility of technology failure and discussed alternative modes of communication if that failure occurs.  I discussed that engaging in this telemedicine visit, they consent to the provision of behavioral healthcare and the services will be billed under their insurance.  Patient and/or legal guardian expressed understanding and consented to Telemedicine visit: Yes   Presenting Concerns: Patient and/or family reports the following symptoms/concerns: Pt states her primary concern today is mood instability, depression, anxiety with panic; coping by stress eating and tobacco; open to implementing healthy self-coping strategy today.  Duration of problem: Ongoing; Severity of problem: moderate  Patient and/or Family's Strengths/Protective Factors: Concrete supports in place (healthy food, safe environments, etc.)  Goals Addressed: Patient will:  Reduce symptoms of: anxiety and depression   Increase knowledge and/or ability of: coping skills   Demonstrate ability to: Increase healthy adjustment to current life  circumstances  Progress towards Goals: Ongoing  Interventions: Interventions utilized:  Mindfulness or Psychologist, educational and Psychoeducation and/or Health Education Standardized Assessments completed:  PHQ9/GAD7 given in past two weeks  Patient and/or Family Response: Pt agrees with treatment plan  Assessment: Patient currently experiencing Adjustment disorder with mixed anxiety and depressed mood.   Patient may benefit from psychoeducation and brief therapeutic interventions regarding coping with symptoms of anxiety and depression .  Plan: Follow up with behavioral health clinician on : One week Behavioral recommendations:  -CALM relaxation breathing exercise twice daily (morning; at bedtime with sleep sounds).   Referral(s): [place] (In Clinic)  I discussed the assessment and treatment plan with the patient and/or parent/guardian. They were provided an opportunity to ask questions and all were answered. They agreed with the plan and demonstrated an understanding of the instructions.   They were advised to call back or seek an in-person evaluation if the symptoms worsen or if the condition fails to improve as anticipated.  Garlan Fair, LCSW  Depression screen [facility] 2/9 12/23/2020 09/10/2020 06/26/2020 05/24/2020 09/04/2019  Decreased Interest 2 - '2 1 1  '$ Down, Depressed, Hopeless '2 2 1 1 1  '$ PHQ - 2 Score '4 2 3 2 2  '$ Altered sleeping '3 1 1 2 1  '$ Tired, decreased energy '2 1 1 2 1  '$ Change in appetite 3 3 0 3 1  Feeling bad or failure about yourself  2 0 0 0 0  Trouble concentrating 2 1 0 0 0  Moving slowly or fidgety/restless 0 0 0 0 0  Suicidal thoughts 0 0 0 0 0  PHQ-9 Score '16 8 5 9 5   '$ GAD 7 : Generalized Anxiety Score 12/23/2020 09/10/2020 06/26/2020 09/04/2019  Nervous, Anxious, on Edge 1 - 1 1  Control/stop worrying 1 2 0 1  Worry too much - different things '1 1 1 1  '$ Trouble relaxing 1 0 0 0  Restless 0 0 0 0  Easily annoyed or irritable '1 3  1 '$ 0  Afraid - awful might happen 1 0 0 0  Total GAD 7 Score 6 - 3 3

## 2020-12-23 NOTE — Progress Notes (Signed)
Obstetrics and Gynecology Visit Return Judy Hanna Evaluation  Appointment Date: 12/23/2020  Primary Care Provider: [place] [place] [facility]  Chief Complaint: urinary incontinence, pelvic pain  History of Present Illness:  Judy Hanna is a 35 y.o. G3P0030 with above CC. PMHx significant for BMI 50s, HTN, tobacco abuse, fibroid uterus, h/o EAB x 3.  HPI: Since that time, she states that Judy Hanna seen for new Judy Hanna visit in March to go over her fibroids and any fertility questions.  At that time, I recommend exp management before any interventions on the fibroids due to her periods being normal and regular and no evidence of infertility due to her not having tried in the recent past.   03/2020 u/s Measurements: 14.9 x 7.4 x 9.1 cm = volume: 524.2 mL. 8.8 x 6.5 x 8.5 cm exophytic fibroid extends from the right uterine fundus. Additional large fibroid centered at the left uterine body measures 7.2 x 6.7 x 7.7 cm  03/2020 CT Reproductive: A 9.2 cm x 7.6 cm isodense mass is seen within the uterine fundus. A similar appearing 9.3 cm x 7.4 cm isodense mass is noted within the expected region of the lower uterine segment. A 2.6 cm x 2.6 cm well-defined area of low attenuation (approximately 11.16 Hounsfield units) is seen along the superior aspect of the left adnexa (axial CT image 72, CT series number 3/coronal reformatted image 50, CT series number 6).  Interval History: Judy Hanna notes stress and urinary incontinence and believes it's due to the fibroids. No UTI s/s. She does smoke and endorses cough. She also has some lower pelvic discomfort and wonders about the fibroids.    Review of Systems: as noted in the History of Present Illness.   Judy Hanna Active Problem List   Diagnosis Date Noted   BMI 50.0-59.9, adult ([place]) 06/26/2020   Tobacco abuse 06/26/2020   Hypertension 06/26/2020   Fibroids 05/22/2020   Axillary abscess  05/22/2020   Essential hypertension 05/22/2020   Positive depression screening 06/05/2019   Morbid obesity ([place]) 06/05/2019   Tobacco dependence 06/05/2019   Menstrual migraine without status migrainosus, not intractable 06/05/2019   Dysmenorrhea 06/05/2019   History of abnormal cervical Pap smear 06/05/2019   Medications:  Sosefina Ewald had no medications administered during this visit. Current Outpatient Medications  Medication Sig Dispense Refill   acetaminophen (TYLENOL) 500 MG tablet Take 1,000 mg by mouth every 6 (six) hours as needed for fever or headache.     meloxicam (MOBIC) 15 MG tablet Take 1 tablet (15 mg total) by mouth daily. 30 tablet 0   naproxen (NAPROSYN) 500 MG tablet Take 1 tablet (500 mg total) by mouth 2 (two) times daily. 30 tablet 0   dicyclomine (BENTYL) 20 MG tablet Take 1 tablet (20 mg total) by mouth 2 (two) times daily. (Judy Hanna not taking: No sig reported) 20 tablet 0   mupirocin ointment (BACTROBAN) 2 % APPLY 1 APPLICATION TOPICALLY 2 (TWO) TIMES DAILY FOR 7 DAYS AS NEEDED. (Judy Hanna not taking: Reported on 12/23/2020) 22 g 1   nicotine (NICODERM CQ - DOSED IN MG/24 HOURS) 21 mg/24hr patch Place 1 patch (21 mg total) onto the skin daily. (Judy Hanna not taking: Reported on 12/23/2020) 28 patch 1   SUMAtriptan (IMITREX) 50 MG tablet Take 1 tablet at the start of the headache.  May repeat in 2 hours if headache does not resolve.  Max 2 hours / 24-hour. (Judy Hanna not taking: No sig reported) 10 tablet 1  No current facility-administered medications for this visit.    Allergies: has No Known Allergies.  Physical Exam:  BP (!) 148/96   Pulse 91   Wt (!) 377 lb 6.4 oz (171.2 kg)   LMP 11/24/2020 (Approximate)   BMI 55.73 kg/m  Body mass index is 55.73 kg/m. General appearance: Well nourished, well developed female in no acute distress.  Neuro/Psych:  Normal mood and affect.    Assessment: pt stable  Plan:  1. Mixed incontinence of urine I told her I  recommend weight loss, smoking cessation as this can lead to urinary incontinence issues, with chronic cough from smoking also contributing; pt is up six lbs from last visit. Strategies for weight loss and smoking cessation d/w her. Pt to work on weight loss and smoking cessation and she is amenable to pelvic floor PT.    I told her I do not recommend removal of any fibroids at this time due to multiple factors but also that ultrasound ablation may be an option for her as well.    PT referal and Ucx sent Message sent to [facility] surgeons to see if she is a candidate for it.  - Urine Culture  2. Tobacco abuse  3. Hypertension, unspecified type   4. Positive depression screening  - Ambulatory referral to [place]  5. Mixed stress and urge urinary incontinence - Ambulatory referral to Physical Therapy  6. Fibroids   7. BMI 50.0-59.9, adult (HCC)    RTC: PRN  Durene Romans MD Attending Center for [employer] [facility])

## 2020-12-24 ENCOUNTER — Ambulatory Visit: Payer: Self-pay | Admitting: Clinical

## 2020-12-24 DIAGNOSIS — F4323 Adjustment disorder with mixed anxiety and depressed mood: Secondary | ICD-10-CM

## 2020-12-24 NOTE — Patient Instructions (Signed)
[facility] [place] [facility] [address] [place], [place] [address] [phone] (main office) [phone] (Leighana Neyman's office)  /Emotional Wellbeing Apps and Websites Here are a few free apps meant to help you to help yourself.  To find, try searching on the internet to see if the app is offered on Apple/Android devices. If your first choice doesn't come up on your device, the good news is that there are many choices! Play around with different apps to see which ones are helpful to you.    Calm This is an app meant to help increase calm feelings. Includes info, strategies, and tools for tracking your feelings.      Calm Harm  This app is meant to help with self-harm. Provides many 5-minute or 15-min coping strategies for doing instead of hurting yourself.       Healthy Minds Health Minds is a problem-solving tool to help deal with emotions and cope with stress you encounter wherever you are.      MindShift This app can help people cope with anxiety. Rather than trying to avoid anxiety, you can make an important shift and face it.      MY3  MY3 features a support system, safety plan and resources with the goal of offering a tool to use in a time of need.       My Life My Voice  This mood journal offers a simple solution for tracking your thoughts, feelings and moods. Animated emoticons can help identify your mood.       Relax Melodies Designed to help with sleep, on this app you can mix sounds and meditations for relaxation.      Smiling Mind Smiling Mind is meditation made easy: it's a simple tool that helps put a smile on your mind.        Stop, Breathe & Think  A friendly, simple guide for people through meditations for mindfulness and compassion.  Stop, Breathe and Think Kids Enter your current feelings and choose a "mission" to help you cope. Offers videos for certain moods instead of just sound recordings.       Team  [place] The goal of this tool is to help teens change how they think, act, and react. This app helps you focus on your own good feelings and experiences.      The Virtual Hope Box The Virtual Hope Box (VHB) contains simple tools to help patients with coping, relaxation, distraction, and positive thinking.     

## 2020-12-25 ENCOUNTER — Ambulatory Visit: Payer: Self-pay | Admitting: Physical Therapy

## 2020-12-26 NOTE — BH Specialist Note (Deleted)
Integrated Behavioral Health via Telemedicine Visit  12/26/2020 Betzy Wickers [id number]  Number of [place] visits: *** Session Start time: 8:45***  Session End time: 9:15*** Total time: {IBH Total [id number]  Referring Provider: Aletha Halim, MD Patient/Family location: Home*** [facility] Provider location: Center for [place] [facility]  All persons participating in visit: Patient *** and [place] ***  Types of Service: {CHL AMB TYPE OF SERVICE:[phone]}  I connected with [name] and/or Demetrius Bedrosian's {family members:20773} via  Telephone or Geologist, engineering  (Video is Tree surgeon) and verified that I am speaking with the correct person using two identifiers. Discussed confidentiality: {YES/NO:21197}  I discussed the limitations of telemedicine and the availability of in person appointments.  Discussed there is a possibility of technology failure and discussed alternative modes of communication if that failure occurs.  I discussed that engaging in this telemedicine visit, they consent to the provision of behavioral healthcare and the services will be billed under their insurance.  Patient and/or legal guardian expressed understanding and consented to Telemedicine visit: {YES/NO:21197}  Presenting Concerns: Patient and/or family reports the following symptoms/concerns: *** Duration of problem: ***; Severity of problem: {Mild/Moderate/Severe:20260}  Patient and/or Family's Strengths/Protective Factors: {CHL AMB BH PROTECTIVE FACTORS:[id number]}  Goals Addressed: Patient will:  Reduce symptoms of: {IBH Symptoms:21014056}   Increase knowledge and/or ability of: {IBH Patient Tools:21014057}   Demonstrate ability to: {IBH Goals:21014053}  Progress towards Goals: {CHL AMB BH PROGRESS TOWARDS GOALS:[phone]}  Interventions: Interventions utilized:  {IBH  Interventions:21014054} Standardized Assessments completed: {IBH Screening Tools:21014051}  Patient and/or Family Response: ***  Assessment: Patient currently experiencing ***.   Patient may benefit from ***.  Plan: Follow up with behavioral health clinician on : *** Behavioral recommendations: *** Referral(s): {IBH Referrals:21014055}  I discussed the assessment and treatment plan with the patient and/or parent/guardian. They were provided an opportunity to ask questions and all were answered. They agreed with the plan and demonstrated an understanding of the instructions.   They were advised to call back or seek an in-person evaluation if the symptoms worsen or if the condition fails to improve as anticipated.  Caroleen Hamman Dacey Milberger, LCSW

## 2020-12-27 LAB — URINE CULTURE

## 2020-12-30 ENCOUNTER — Other Ambulatory Visit: Payer: Self-pay | Admitting: Obstetrics and Gynecology

## 2020-12-31 ENCOUNTER — Ambulatory Visit: Payer: Self-pay | Admitting: Clinical

## 2020-12-31 DIAGNOSIS — F4323 Adjustment disorder with mixed anxiety and depressed mood: Secondary | ICD-10-CM

## 2020-12-31 DIAGNOSIS — F4321 Adjustment disorder with depressed mood: Secondary | ICD-10-CM

## 2020-12-31 DIAGNOSIS — Z658 Other specified problems related to psychosocial circumstances: Secondary | ICD-10-CM

## 2020-12-31 NOTE — Patient Instructions (Signed)
[facility] at [place] [facility] [address] [place], [place] [address] [phone] (main office) [phone] (Whisper Kurka's office)   

## 2020-12-31 NOTE — BH Specialist Note (Signed)
Integrated Behavioral Health via Telemedicine Visit  12/31/2020 Reigan Moala [id number]  Number of [place] visits: 2 Session Start time: 1:53  Session End time: 2:07 Total time:  14  Referring Provider: Aletha Halim, MD Patient/Family location: Home [facility] Provider location: [facility] [place] [facility]  All persons participating in visit: Patient Kalista Cugini and Crowell   Types of Service: Individual psychotherapy  I connected with [name] and/or Everli Faulds's  n/a  via  Telephone or Geologist, engineering  (Video is Tree surgeon) and verified that I am speaking with the correct person using two identifiers. Discussed confidentiality: Yes   I discussed the limitations of telemedicine and the availability of in person appointments.  Discussed there is a possibility of technology failure and discussed alternative modes of communication if that failure occurs.  I discussed that engaging in this telemedicine visit, they consent to the provision of behavioral healthcare and the services will be billed under their insurance.  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 Pt in therapy in the past; open to referral for ongoing therapy.  Duration of problem: Increase stress today; Severity of problem:  moderately severe  Patient and/or Family's Strengths/Protective Factors: Social connections and [place] supports in place (healthy food, safe environments, etc.)  Goals Addressed: Patient will:  Reduce symptoms of: anxiety,  depression, mood instability, and stress   Demonstrate ability to: Increase healthy adjustment to current life circumstances and continue healthy grieving over loss of grandmother  Progress towards Goals: Ongoing  Interventions: Interventions utilized:  Supportive Reflection Standardized Assessments completed:  PHQ9/GAD7 given in past two weeks  Patient and/or Family Response:Pt agrees with treatment plan  Assessment: Patient currently experiencing Grief, Adjustment disorder with mixed anxious and depressed mood and Psychosocial stress.   Patient may benefit from continued psychoeducation and brief therapeutic interventions regarding coping with symptoms of anxiety, depression, life stress; referral for ongoing therapy  Plan: Follow up with behavioral health clinician on : Call Roselyn Reef as needed at [phone]  Behavioral recommendations:  -Continue reducing smoking daily for improved health -Continue using self-coping strategies daily, to manage emotions -Continue with plan to find more appropriate work, in a healthy work environment -Accept referral to [facility] for ongoing therapy Referral(s): Integrated [facility]ist (In Clinic) and [place] (LME/Outside Clinic)  I discussed the assessment and treatment plan with the patient and/or parent/guardian. They were provided an opportunity to ask questions and all were answered. They agreed with the plan and demonstrated an understanding of the instructions.   They were advised to call back or seek an in-person evaluation if the symptoms worsen or if the condition fails to improve as anticipated.  Garlan Fair, LCSW  Depression screen [facility] 2/9 12/23/2020 09/10/2020 06/26/2020 05/24/2020 09/04/2019  Decreased Interest 2 - '2 1 1  '$ Down, Depressed, Hopeless '2 2 1 1 1  '$ PHQ - 2 Score '4 2 3 2 2  '$ Altered sleeping '3 1 1 2 1  '$ Tired, decreased energy '2 1 1 2 1  '$ Change in appetite 3 3 0 3 1  Feeling bad or failure  about yourself  2 0 0  0 0  Trouble concentrating 2 1 0 0 0  Moving slowly or fidgety/restless 0 0 0 0 0  Suicidal thoughts 0 0 0 0 0  PHQ-9 Score '16 8 5 9 5   '$ GAD 7 : Generalized Anxiety Score 12/23/2020 09/10/2020 06/26/2020 09/04/2019  Nervous, Anxious, on Edge 1 - 1 1  Control/stop worrying 1 2 0 1  Worry too much - different things '1 1 1 1  '$ Trouble relaxing 1 0 0 0  Restless 0 0 0 0  Easily annoyed or irritable '1 3 1 '$ 0  Afraid - awful might happen 1 0 0 0  Total GAD 7 Score 6 - 3 3

## 2021-01-06 ENCOUNTER — Telehealth: Payer: Self-pay | Admitting: Clinical

## 2021-01-06 NOTE — Telephone Encounter (Signed)
Attempt to f/u with pt; Unable to leave voice message as voicemail has not been set up.

## 2021-01-11 ENCOUNTER — Emergency Department (HOSPITAL_COMMUNITY)
Admission: EM | Admit: 2021-01-11 | Discharge: 2021-01-11 | Disposition: A | Discharge status: Home / Self Care | Payer: Self-pay | Attending: Emergency Medicine | Admitting: Emergency Medicine

## 2021-01-11 ENCOUNTER — Encounter (HOSPITAL_COMMUNITY): Payer: Self-pay | Admitting: Emergency Medicine

## 2021-01-11 ENCOUNTER — Other Ambulatory Visit: Payer: Self-pay

## 2021-01-11 ENCOUNTER — Emergency Department (HOSPITAL_COMMUNITY): Payer: Self-pay

## 2021-01-11 DIAGNOSIS — I10 Essential (primary) hypertension: Secondary | ICD-10-CM | POA: Insufficient documentation

## 2021-01-11 DIAGNOSIS — J209 Acute bronchitis, unspecified: Secondary | ICD-10-CM | POA: Insufficient documentation

## 2021-01-11 DIAGNOSIS — J3489 Other specified disorders of nose and nasal sinuses: Secondary | ICD-10-CM | POA: Insufficient documentation

## 2021-01-11 DIAGNOSIS — Z20822 Contact with and (suspected) exposure to covid-19: Secondary | ICD-10-CM | POA: Insufficient documentation

## 2021-01-11 DIAGNOSIS — Z79899 Other long term (current) drug therapy: Secondary | ICD-10-CM | POA: Insufficient documentation

## 2021-01-11 DIAGNOSIS — R000 Tachycardia, unspecified: Secondary | ICD-10-CM | POA: Insufficient documentation

## 2021-01-11 LAB — RESP PANEL BY RT-PCR (FLU A&B, COVID) ARPGX2
Influenza A by PCR: NEGATIVE
Influenza B by PCR: NEGATIVE
SARS Coronavirus 2 by RT PCR: NEGATIVE

## 2021-01-11 MED ORDER — DEXTROMETHORPHAN HBR 15 MG/5ML PO SYRP: 10.0000 mL | ORAL_SOLUTION | Freq: Four times a day (QID) | ORAL | 0 refills | Status: DC | PRN

## 2021-01-11 MED ORDER — ACETAMINOPHEN 325 MG PO TABS
650.0000 mg | ORAL_TABLET | Freq: Once | ORAL | Status: AC | PRN
  Administered 2021-01-11: 650 mg via ORAL
  Filled 2021-01-11: qty 2

## 2021-01-11 MED ORDER — AZITHROMYCIN 250 MG PO TABS
250.0000 mg | ORAL_TABLET | Freq: Every day | ORAL | 0 refills | Status: AC
  Filled 2021-01-15: qty 6, 5d supply, fill #0

## 2021-01-11 NOTE — ED Notes (Signed)
DC instructions reviewed with pt. Pt verbalized understanding.  PT DC.  

## 2021-01-11 NOTE — Discharge Instructions (Addendum)
Read the instructions below on reasons to return to the emergency department and to learn more about your diagnosis.  Use over the counter medications for symptomatic relief as we discussed (musinex as a decongestant, Tylenol for fever/pain, Motrin/Ibuprofen for muscle aches). If prescribed a cough suppressant during your visit, do not operate heavy machinery with in 5 hours of taking this medication. Followup with your primary care doctor in 4 days if your symptoms persist.  Your more than welcome to return to the emergency department if symptoms worsen or become concerning.  Upper Respiratory Infection, Adult  An upper respiratory infection (URI) is also sometimes known as the common cold. Most people improve within 1 week, but symptoms can last up to 2 weeks. A residual cough may last even longer.   URI is most commonly caused by a virus. Viruses are NOT treated with antibiotics. You can easily spread the virus to others by oral contact. This includes kissing, sharing a glass, coughing, or sneezing. Touching your mouth or nose and then touching a surface, which is then touched by another person, can also spread the virus.   TREATMENT  Treatment is directed at relieving symptoms. There is no cure. Antibiotics are not effective, because the infection is caused by a virus, not by bacteria. Treatment may include:  Increased fluid intake. Sports drinks offer valuable electrolytes, sugars, and fluids.  Breathing heated mist or steam (vaporizer or shower).  Eating chicken soup or other clear broths, and maintaining good nutrition.  Getting plenty of rest.  Using gargles or lozenges for comfort.  Controlling fevers with ibuprofen or acetaminophen as directed by your caregiver.  Increasing usage of your inhaler if you have asthma.  Return to work when your temperature has returned to normal.   SEEK MEDICAL CARE IF:  After the first few days, you feel you are getting worse rather than better.  You  develop worsening shortness of breath, or brown or red sputum. These may be signs of pneumonia.  You develop yellow or brown nasal discharge or pain in the face, especially when you bend forward. These may be signs of sinusitis.  You develop a fever, swollen neck glands, pain with swallowing, or white areas in the back of your throat. These may be signs of strep throat.

## 2021-01-11 NOTE — ED Triage Notes (Signed)
Pt c/o generalized body aches, nasal congestion and fever.

## 2021-01-11 NOTE — ED Provider Notes (Signed)
[facility] EMERGENCY DEPARTMENT Provider Note   CSN: [id number] Arrival date & time: 01/11/21  1710     History Chief Complaint  Patient presents with   Fever    Judy Hanna is a 35 y.o. female.  Medical history of obesity, tobacco use, hypertension.  Patient presents with 3 days of upper respiratory symptoms.  She has had congestion since Wednesday that she originally attributed to allergies since symptoms, however she has continued to progressively worsen.  She has had some chest tightness and feels like her chest is full of mucus.  She has associated body aches.  Tested negative for COVID yesterday.  Today she developed a fever to 101.  That is when she decided to come to the emergency department.  She has a chronic smoker and has a history of bronchitis.  She denies any sore throat, cough, shortness of breath, chest pain   Fever Associated symptoms: congestion and rhinorrhea   Associated symptoms: no chest pain, no chills, no cough, no diarrhea, no headaches, no nausea, no rash, no sore throat and no vomiting       Past Medical History:  Diagnosis Date   BMI 50.0-59.9, adult ([place]) 06/26/2020    Patient Active Problem List   Diagnosis Date Noted   BMI 50.0-59.9, adult ([place]) 06/26/2020   Tobacco abuse 06/26/2020   Fibroids 05/22/2020   Axillary abscess 05/22/2020   Essential hypertension 05/22/2020   Positive depression screening 06/05/2019   Morbid obesity ([place]) 06/05/2019   Tobacco dependence 06/05/2019   Menstrual migraine without status migrainosus, not intractable 06/05/2019   History of abnormal cervical Pap smear 06/05/2019    History reviewed. No pertinent surgical history.   OB History     Gravida  3   Para  0   Term  0   Preterm  0   AB  3   Living         SAB  0   IAB  3   Ectopic  0   Multiple      Live Births              Family History  Problem Relation Age of Onset   Hypertension Mother    Hypertension  Sister    Hypertension Maternal Grandmother     Social History   Tobacco Use   Smoking status: Every Day    Packs/day: 1.00    Years: 3.00    Pack years: 3.00    Types: Cigarettes   Smokeless tobacco: Never  Vaping Use   Vaping Use: Never used  Substance Use Topics   Alcohol use: Yes    Comment: occisonally    Drug use: Not Currently    Types: Marijuana    Home Medications Prior to Admission medications   Medication Sig Start Date End Date Taking? Authorizing Provider  azithromycin (ZITHROMAX) 250 MG tablet Take 1 tablet (250 mg total) by mouth daily for 5 days. Take first 2 tablets together, then 1 every day until finished. 01/11/21 01/16/21 Yes Jari Dipasquale, Adora Fridge, PA-C  dextromethorphan 15 MG/5ML syrup Take 10 mLs (30 mg total) by mouth 4 (four) times daily as needed for cough. 01/11/21  Yes Naliyah Neth, Adora Fridge, PA-C  acetaminophen (TYLENOL) 500 MG tablet Take 1,000 mg by mouth every 6 (six) hours as needed for fever or headache.    [provider]  dicyclomine (BENTYL) 20 MG tablet Take 1 tablet (20 mg total) by mouth 2 (two) times daily. Patient not  taking: No sig reported 04/04/20   Henderly, Britni A, PA-C  meloxicam (MOBIC) 15 MG tablet Take 1 tablet (15 mg total) by mouth daily. 08/19/20   White, Leitha Schuller, NP  mupirocin ointment (BACTROBAN) 2 % APPLY 1 APPLICATION TOPICALLY 2 (TWO) TIMES DAILY FOR 7 DAYS AS NEEDED. Patient not taking: Reported on 12/23/2020 07/10/20 07/10/21  Argentina Donovan, PA-C  naproxen (NAPROSYN) 500 MG tablet Take 1 tablet (500 mg total) by mouth 2 (two) times daily. 05/21/20   Suzy Bouchard, PA-C  nicotine (NICODERM CQ - DOSED IN MG/24 HOURS) 21 mg/24hr patch Place 1 patch (21 mg total) onto the skin daily. Patient not taking: Reported on 12/23/2020 12/02/20   Ladell Pier, MD  SUMAtriptan (IMITREX) 50 MG tablet Take 1 tablet at the start of the headache.  May repeat in 2 hours if headache does not resolve.  Max 2 hours / 24-hour. Patient  not taking: No sig reported 09/04/19   Ladell Pier, MD    Allergies    Patient has no known allergies.  Review of Systems   Review of Systems  Constitutional:  Positive for fever. Negative for chills.  HENT:  Positive for congestion and rhinorrhea. Negative for sore throat.   Eyes:  Negative for visual disturbance.  Respiratory:  Positive for chest tightness. Negative for cough and shortness of breath.   Cardiovascular:  Negative for chest pain, palpitations and leg swelling.  Gastrointestinal:  Negative for abdominal pain, constipation, diarrhea, nausea and vomiting.  Genitourinary:  Negative for difficulty urinating.  Musculoskeletal:  Negative for back pain.  Skin:  Negative for rash and wound.  Neurological:  Negative for dizziness, syncope, weakness, light-headedness and headaches.  All other systems reviewed and are negative.  Physical Exam Updated Vital Signs BP 135/89 (BP Location: Right Wrist)   Pulse (!) 111   Temp 99.2 F (37.3 C) (Oral)   Resp 16   SpO2 99%   Physical Exam Vitals and nursing note reviewed.  Constitutional:      General: She is not in acute distress.    Appearance: Normal appearance. She is not ill-appearing, toxic-appearing or diaphoretic.  HENT:     Head: Normocephalic and atraumatic.     Right Ear: Tympanic membrane normal.     Left Ear: Tympanic membrane normal.     Nose: Nose normal.     Mouth/Throat:     Mouth: Mucous membranes are moist.     Pharynx: Oropharynx is clear. No oropharyngeal exudate or posterior oropharyngeal erythema.  Eyes:     General: No scleral icterus.       Right eye: No discharge.        Left eye: No discharge.     Conjunctiva/sclera: Conjunctivae normal.  Cardiovascular:     Rate and Rhythm: Regular rhythm. Tachycardia present.     Pulses: Normal pulses.     Heart sounds: Normal heart sounds, S1 normal and S2 normal. No murmur heard.   No friction rub. No gallop.  Pulmonary:     Effort: Pulmonary  effort is normal. No respiratory distress.     Breath sounds: Rhonchi present. No wheezing or rales.  Abdominal:     General: Abdomen is flat. Bowel sounds are normal. There is no distension.     Palpations: Abdomen is soft. There is no pulsatile mass.     Tenderness: There is no abdominal tenderness. There is no guarding or rebound.  Musculoskeletal:     Right lower leg: No edema.  Left lower leg: No edema.  Skin:    General: Skin is warm and dry.     Coloration: Skin is not jaundiced.     Findings: No bruising, erythema, lesion or rash.  Neurological:     General: No focal deficit present.     Mental Status: She is alert and oriented to person, place, and time.  Psychiatric:        Mood and Affect: Mood normal.        Behavior: Behavior normal.    ED Results / Procedures / Treatments   Labs (all labs ordered are listed, but only abnormal results are displayed) Labs Reviewed  RESP PANEL BY RT-PCR (FLU A&B, COVID) [id number]    EKG None  Radiology DG Chest 2 View  Result Date: 01/11/2021 CLINICAL DATA:  Possible pneumonia.  Fever, cough, smoker. EXAM: CHEST - 2 VIEW COMPARISON:  09/30/2019 FINDINGS: Shallow inspiration. Heart size and pulmonary vascularity are normal for technique. No airspace disease or consolidation in the lungs. No pleural effusions. No pneumothorax. Mediastinal contours appear intact. IMPRESSION: No active cardiopulmonary disease. Electronically Signed   By: Lucienne Capers M.D.   On: 01/11/2021 19:55    Procedures Procedures   Medications Ordered in ED Medications  acetaminophen (TYLENOL) tablet 650 mg (650 mg Oral Given 01/11/21 1755)    ED Course  I have reviewed the triage vital signs and the nursing notes.  Pertinent labs & imaging results that were available during my care of the patient were reviewed by me and considered in my medical decision making (see chart for details).    MDM Rules/Calculators/A&P                           Well-appearing 35 year old female who presents to the emergency department with 3 days of upper respiratory symptoms.  She had a fever to 101 today.  Her vitals are significant for temperature 102 and a pulse rate of 137.  Tachycardia is likely secondary to fever so we will treat with Tylenol.  Otherwise her vital signs are hemodynamically stable.  Is oxygenating well on room air.  Her lungs are with no respiratory distress and some mild rhonchi heard on auscultation in bilateral lung fields.  Otherwise her exam is unremarkable.  COVID and flu testing was negative.  We will do chest x-ray for pneumonia.  If negative we will treat patient for acute bronchitis given past medical history of this.  Plan on reevaluating vital signs make sure they are stable prior to discharge.  On reevaluation, vital signs much improved.  COVID and flu were negative.  Chest x-ray with no evidence of pneumonia.  Based on the patient's presentation she likely has acute bronchitis.  Given smoking history and previous chronic bronchitis infections, I will send her home with a 5-day course of antibiotics.  I discussed this with the patient and urged her not to start the antibiotics unless she does not improve within a 2 days. I also provided her with dextromethorphan cough medication. Patient aware of plan and return precautions given if patient develops worsening shortness of breath or chest pain.     Final Clinical Impression(s) / ED Diagnoses Final diagnoses:  Acute bronchitis, unspecified organism    Rx / DC Orders ED Discharge Orders          Ordered    azithromycin (ZITHROMAX) 250 MG tablet  Daily        01/11/21 2017  dextromethorphan 15 MG/5ML syrup  4 times daily PRN        01/11/21 2017             Adolphus Birchwood, PA-C [id number]    Campbell Stall P, DO [id number]

## 2021-01-13 ENCOUNTER — Emergency Department (HOSPITAL_COMMUNITY)
Admission: EM | Admit: 2021-01-13 | Discharge: 2021-01-13 | Disposition: A | Discharge status: Home / Self Care | Payer: Self-pay | Attending: Emergency Medicine | Admitting: Emergency Medicine

## 2021-01-13 ENCOUNTER — Emergency Department (HOSPITAL_COMMUNITY): Payer: Self-pay

## 2021-01-13 ENCOUNTER — Other Ambulatory Visit: Payer: Self-pay

## 2021-01-13 ENCOUNTER — Encounter (HOSPITAL_COMMUNITY): Payer: Self-pay

## 2021-01-13 DIAGNOSIS — N9489 Other specified conditions associated with female genital organs and menstrual cycle: Secondary | ICD-10-CM | POA: Insufficient documentation

## 2021-01-13 DIAGNOSIS — R079 Chest pain, unspecified: Secondary | ICD-10-CM | POA: Insufficient documentation

## 2021-01-13 DIAGNOSIS — R197 Diarrhea, unspecified: Secondary | ICD-10-CM | POA: Insufficient documentation

## 2021-01-13 DIAGNOSIS — F17210 Nicotine dependence, cigarettes, uncomplicated: Secondary | ICD-10-CM | POA: Insufficient documentation

## 2021-01-13 DIAGNOSIS — I10 Essential (primary) hypertension: Secondary | ICD-10-CM | POA: Insufficient documentation

## 2021-01-13 DIAGNOSIS — R1084 Generalized abdominal pain: Secondary | ICD-10-CM

## 2021-01-13 DIAGNOSIS — D219 Benign neoplasm of connective and other soft tissue, unspecified: Secondary | ICD-10-CM

## 2021-01-13 DIAGNOSIS — D259 Leiomyoma of uterus, unspecified: Secondary | ICD-10-CM | POA: Insufficient documentation

## 2021-01-13 DIAGNOSIS — R0602 Shortness of breath: Secondary | ICD-10-CM | POA: Insufficient documentation

## 2021-01-13 HISTORY — DX: Benign neoplasm of connective and other soft tissue, unspecified: D21.9

## 2021-01-13 LAB — CBC
HCT: 41 % (ref 36.0–46.0)
Hemoglobin: 14.1 g/dL (ref 12.0–15.0)
MCH: 26.4 pg (ref 26.0–34.0)
MCHC: 34.4 g/dL (ref 30.0–36.0)
MCV: 76.6 fL — ABNORMAL LOW (ref 80.0–100.0)
Platelets: 317 10*3/uL (ref 150–400)
RBC: 5.35 MIL/uL — ABNORMAL HIGH (ref 3.87–5.11)
RDW: 15 % (ref 11.5–15.5)
WBC: 9.5 10*3/uL (ref 4.0–10.5)
nRBC: 0 % (ref 0.0–0.2)

## 2021-01-13 LAB — BASIC METABOLIC PANEL
Anion gap: 8 (ref 5–15)
BUN: 7 mg/dL (ref 6–20)
CO2: 26 mmol/L (ref 22–32)
Calcium: 8.8 mg/dL — ABNORMAL LOW (ref 8.9–10.3)
Chloride: 104 mmol/L (ref 98–111)
Creatinine, Ser: 0.72 mg/dL (ref 0.44–1.00)
GFR, Estimated: >60 mL/min (ref >60)
Glucose, Bld: 108 mg/dL — ABNORMAL HIGH (ref 70–99)
Potassium: 3.9 mmol/L (ref 3.5–5.1)
Sodium: 138 mmol/L (ref 135–145)

## 2021-01-13 LAB — I-STAT BETA HCG BLOOD, ED (MC, WL, AP ONLY): I-stat hCG, quantitative: <5 m[IU]/mL (ref <5)

## 2021-01-13 LAB — TROPONIN I (HIGH SENSITIVITY): Troponin I (High Sensitivity): 3 ng/L (ref <18)

## 2021-01-13 MED ORDER — NAPROXEN 500 MG PO TABS
500.0000 mg | ORAL_TABLET | Freq: Two times a day (BID) | ORAL | 0 refills | Status: DC
  Filled 2021-01-13: qty 30, 15d supply, fill #0

## 2021-01-13 MED ORDER — PANTOPRAZOLE SODIUM 40 MG PO TBEC
40.0000 mg | DELAYED_RELEASE_TABLET | Freq: Every day | ORAL | Status: DC
  Administered 2021-01-13: 40 mg via ORAL
  Filled 2021-01-13: qty 1

## 2021-01-13 MED ORDER — OMEPRAZOLE 20 MG PO CPDR
20.0000 mg | DELAYED_RELEASE_CAPSULE | Freq: Two times a day (BID) | ORAL | 0 refills | Status: DC
  Filled 2021-01-13: qty 60, 30d supply, fill #0

## 2021-01-13 MED ORDER — NAPROXEN 500 MG PO TABS
500.0000 mg | ORAL_TABLET | Freq: Once | ORAL | Status: AC
  Administered 2021-01-13: 500 mg via ORAL
  Filled 2021-01-13: qty 1

## 2021-01-13 NOTE — ED Provider Notes (Signed)
[facility] DEPT Provider Note   CSN: [id number] Arrival date & time: 01/13/21  0850     History Chief Complaint  Patient presents with   Abdominal Pain   Shortness of Breath   Chest Pain   Diarrhea    Judy Hanna is a 35 y.o. female.  HPI     35 year old female comes in a chief complaint of abdominal pain, shortness of breath, chest pain and diarrhea.  Her primary complaint is abdominal pain.  Patient has history of fibroids.  She reports that the abdominal pain is generalized, has been present over the last few days.  Initially was present only after p.o. intake, but more recently the pain just comes whenever.  The pain duration is usually few minutes.  She has had some loose BM with it.  No nausea or vomiting.  No history of gallstones.  She denies any vaginal discharge, bleeding, UTI-like symptoms.  Additionally, patient reports having some cough that is now producing green phlegm.  Recently she was seen at [facility] where her COVID-19 test was negative, patient was started on antibiotics.  She states that the cough has persisted and she is having chest pain with it.  Shortness of breath is unchanged and is typically positional and exertional.  Pt has no hx of PE, DVT and denies any exogenous hormone (testosterone / estrogen) use, long distance travels or surgery in the past 6 weeks, active cancer, recent immobilization.   Past Medical History:  Diagnosis Date   BMI 50.0-59.9, adult ([place]) 06/26/2020   Fibroids     Patient Active Problem List   Diagnosis Date Noted   BMI 50.0-59.9, adult ([place]) 06/26/2020   Tobacco abuse 06/26/2020   Fibroids 05/22/2020   Axillary abscess 05/22/2020   Essential hypertension 05/22/2020   Positive depression screening 06/05/2019   Morbid obesity ([place]) 06/05/2019   Tobacco dependence 06/05/2019   Menstrual migraine without status migrainosus, not intractable 06/05/2019   History of abnormal cervical Pap smear  06/05/2019    History reviewed. No pertinent surgical history.   OB History     Gravida  3   Para  0   Term  0   Preterm  0   AB  3   Living         SAB  0   IAB  3   Ectopic  0   Multiple      Live Births              Family History  Problem Relation Age of Onset   Hypertension Mother    Hypertension Sister    Hypertension Maternal Grandmother     Social History   Tobacco Use   Smoking status: Every Day    Packs/day: 1.00    Years: 3.00    Pack years: 3.00    Types: Cigarettes   Smokeless tobacco: Never  Vaping Use   Vaping Use: Never used  Substance Use Topics   Alcohol use: Yes    Comment: occisonally    Drug use: Not Currently    Types: Marijuana    Home Medications Prior to Admission medications   Medication Sig Start Date End Date Taking? Authorizing Provider  naproxen (NAPROSYN) 500 MG tablet Take 1 tablet (500 mg total) by mouth 2 (two) times daily. 01/13/21  Yes Varney Biles, MD  omeprazole (PRILOSEC) 20 MG capsule Take 1 capsule (20 mg total) by mouth 2 (two) times daily before a meal. 01/13/21  Yes Nichalas Coin,  Timothea Bodenheimer, MD  acetaminophen (TYLENOL) 500 MG tablet Take 1,000 mg by mouth every 6 (six) hours as needed for fever or headache.    [provider]  azithromycin (ZITHROMAX) 250 MG tablet Take 1 tablet (250 mg total) by mouth daily for 5 days. Take first 2 tablets together, then 1 every day until finished. 01/11/21 01/16/21  Loeffler, Adora Fridge, PA-C  dextromethorphan 15 MG/5ML syrup Take 10 mLs (30 mg total) by mouth 4 (four) times daily as needed for cough. 01/11/21   Loeffler, Adora Fridge, PA-C  dicyclomine (BENTYL) 20 MG tablet Take 1 tablet (20 mg total) by mouth 2 (two) times daily. Patient not taking: No sig reported 04/04/20   Henderly, Britni A, PA-C  mupirocin ointment (BACTROBAN) 2 % APPLY 1 APPLICATION TOPICALLY 2 (TWO) TIMES DAILY FOR 7 DAYS AS NEEDED. Patient not taking: Reported on 12/23/2020 07/10/20 07/10/21  Argentina Donovan, PA-C  nicotine (NICODERM CQ - DOSED IN MG/24 HOURS) 21 mg/24hr patch Place 1 patch (21 mg total) onto the skin daily. Patient not taking: Reported on 12/23/2020 12/02/20   Ladell Pier, MD  SUMAtriptan (IMITREX) 50 MG tablet Take 1 tablet at the start of the headache.  May repeat in 2 hours if headache does not resolve.  Max 2 hours / 24-hour. Patient not taking: No sig reported 09/04/19   Ladell Pier, MD    Allergies    Patient has no known allergies.  Review of Systems   Review of Systems  Constitutional:  Positive for activity change.  Respiratory:  Positive for cough and shortness of breath.   Cardiovascular:  Positive for chest pain.  Gastrointestinal:  Positive for abdominal pain and diarrhea. Negative for blood in stool.  Genitourinary:  Negative for dysuria.  Hematological:  Does not bruise/bleed easily.  All other systems reviewed and are negative.  Physical Exam Updated Vital Signs BP (!) 131/93   Pulse 93   Temp 98.6 F (37 C) (Oral)   Resp 17   Ht 5\' 9"  (1.753 m)   Wt (!) 163.3 kg   LMP 12/31/2020   SpO2 98%   BMI 53.16 kg/m   Physical Exam Vitals and nursing note reviewed.  Constitutional:      Appearance: She is well-developed.  HENT:     Head: Atraumatic.  Cardiovascular:     Rate and Rhythm: Normal rate.  Pulmonary:     Effort: Pulmonary effort is normal.  Abdominal:     Tenderness: There is generalized abdominal tenderness and tenderness in the right upper quadrant, epigastric area, periumbilical area and left upper quadrant. There is no guarding or rebound. Negative signs include Murphy's sign and McBurney's sign.  Musculoskeletal:     Cervical back: Normal range of motion and neck supple.  Skin:    General: Skin is warm and dry.  Neurological:     Mental Status: She is alert and oriented to person, place, and time.    ED Results / Procedures / Treatments   Labs (all labs ordered are listed, but only abnormal results are  displayed) Labs Reviewed  BASIC METABOLIC PANEL - Abnormal; Notable for the following components:      Result Value   Glucose, Bld 108 (*)    Calcium 8.8 (*)    All other components within normal limits  CBC - Abnormal; Notable for the following components:   RBC 5.35 (*)    MCV 76.6 (*)    All other components within normal limits  I-STAT  BETA HCG BLOOD, ED ([facility], [facility], [facility] ONLY)  TROPONIN I (HIGH SENSITIVITY)  TROPONIN I (HIGH SENSITIVITY)    EKG EKG Interpretation  Date/Time:  Monday January 13 2021 08:59:19 EDT Ventricular Rate:  95 PR Interval:  158 QRS Duration: 81 QT Interval:  358 QTC Calculation: 450 R Axis:   33 Text Interpretation: Sinus rhythm Low voltage, precordial leads No acute changes No significant change since last tracing Confirmed by Varney Biles [phone]) on 01/13/2021 10:09:47 AM  Radiology DG Chest 2 View  Result Date: 01/13/2021 CLINICAL DATA:  chest pain EXAM: CHEST - 2 VIEW COMPARISON:  None. FINDINGS: Slightly hypoventilatory exam results in some perihilar bronchovascular crowding. The cardiomediastinal silhouette is within normal limits. No pleural effusion. No pneumothorax. No mass or consolidation. No acute osseous abnormality. IMPRESSION: No acute abnormality in the chest. Electronically Signed   By: Albin Felling M.D.   On: 01/13/2021 09:29   DG Chest 2 View  Result Date: 01/11/2021 CLINICAL DATA:  Possible pneumonia.  Fever, cough, smoker. EXAM: CHEST - 2 VIEW COMPARISON:  09/30/2019 FINDINGS: Shallow inspiration. Heart size and pulmonary vascularity are normal for technique. No airspace disease or consolidation in the lungs. No pleural effusions. No pneumothorax. Mediastinal contours appear intact. IMPRESSION: No active cardiopulmonary disease. Electronically Signed   By: Lucienne Capers M.D.   On: 01/11/2021 19:55   US Abdomen Limited  Result Date: 01/13/2021 CLINICAL DATA:  Right upper quadrant pain EXAM: ULTRASOUND ABDOMEN LIMITED RIGHT  UPPER QUADRANT COMPARISON:  Abdominal CT 04/04/2020 FINDINGS: Gallbladder: No gallstones or wall thickening visualized. No sonographic Murphy sign noted by sonographer. Common bile duct: Diameter: 2 mm Liver: No focal lesion identified. Within normal limits in parenchymal echogenicity. Portal vein is patent on color Doppler imaging with normal direction of blood flow towards the liver. IMPRESSION: Negative right upper quadrant ultrasound. Electronically Signed   By: Jorje Guild M.D.   On: 01/13/2021 11:36    Procedures Procedures   Medications Ordered in ED Medications  naproxen (NAPROSYN) tablet 500 mg (has no administration in time range)  pantoprazole (PROTONIX) EC tablet 40 mg (has no administration in time range)    ED Course  I have reviewed the triage vital signs and the nursing notes.  Pertinent labs & imaging results that were available during my care of the patient were reviewed by me and considered in my medical decision making (see chart for details).    MDM Rules/Calculators/A&P                           35 year old comes in with chief complaint of abdominal pain.  She has history of fibroids.  Pain is generalized but mostly in the epigastric and right upper quadrant.  Pain used to be postprandial, but now becoming more random.  Ultrasound right upper quadrant ordered and it is negative for acute finding.  Initial thought was that patient might have cholelithiasis, PUD, gastroenteritis.  Patient did not have any peritoneal findings basic blood work-up is reassuring.  Reviewed patient's December of last year, it was overall reassuring besides fibroids and ovarian cyst.  Patient reassessed, no change in exam and I do not think CT is indicated.  She will benefit with outpatient GYN follow-up to see if the fibroids, cyst or even a condition like endometriosis is the cause for this pain.  Omeprazole has also been prescribed.  Additionally, her side complaint is that of chest  pain with cough and shortness of breath.  She was recently diagnosed with bronchitis.  Currently her lung exam is clear.  EKG is not showing any acute findings.  She is PERC negative.  PE considered in the differential diagnosis, but given that she is just going through bronchitis right now, it might be premature to pursue that diagnosis at this time.  She is comfortable with outpatient follow-up about the symptoms if they are not getting better, especially given that her primary issue today was more of an abdominal pain.  The patient appears reasonably screened and/or stabilized for discharge and I doubt any other medical condition or other [facility] requiring further screening, evaluation, or treatment in the ED at this time prior to discharge.   Results from the ER workup discussed with the patient face to face and all questions answered to the best of my ability. The patient is safe for discharge with strict return precautions.   Final Clinical Impression(s) / ED Diagnoses Final diagnoses:  Fibroids  Generalized abdominal pain    Rx / DC Orders ED Discharge Orders          Ordered    omeprazole (PRILOSEC) 20 MG capsule  2 times daily before meals        01/13/21 1316    naproxen (NAPROSYN) 500 MG tablet  2 times daily        01/13/21 [place], Kohle Winner, MD 01/13/21 1331

## 2021-01-13 NOTE — ED Triage Notes (Signed)
Patient c/o SOB x 2 days. Patient reports that she began having chest pain and abdominal pain with diarrhea since yesterday.  Patient was seen at [facility] ED 2 days ago for body aches, nasal congestion, and fever.

## 2021-01-13 NOTE — ED Notes (Signed)
Pt ambulatory without assistance.  

## 2021-01-13 NOTE — Discharge Instructions (Addendum)
We saw in the ER for abdominal pain.  All labs are reassuring.  Ultrasound does not reveal any gallstones.  As discussed, your previous CT scan has shown fibroids and perhaps ovarian cyst.  They could be contributing to your discomfort.  For now, we recommend that you take clear liquid diet and soft diet for 3 to 5 days.  Additionally take the anti-inflammatory medication prescribed and call your PCP for a follow-up in 1 week.  We would also like you to address your shortness of breath with your primary care doctor at that time if it continues, given that you are just treated for bronchitis.  Return to the ER if your abdominal pain gets worse or if your shortness of breath gets worse.

## 2021-01-15 ENCOUNTER — Other Ambulatory Visit: Payer: Self-pay

## 2021-01-16 ENCOUNTER — Ambulatory Visit: Payer: Self-pay | Attending: Obstetrics and Gynecology | Admitting: Physical Therapy

## 2021-01-16 ENCOUNTER — Encounter: Payer: Self-pay | Admitting: Physical Therapy

## 2021-01-16 ENCOUNTER — Other Ambulatory Visit: Payer: Self-pay

## 2021-01-16 DIAGNOSIS — R293 Abnormal posture: Secondary | ICD-10-CM | POA: Insufficient documentation

## 2021-01-16 DIAGNOSIS — M6281 Muscle weakness (generalized): Secondary | ICD-10-CM | POA: Insufficient documentation

## 2021-01-16 DIAGNOSIS — R279 Unspecified lack of coordination: Secondary | ICD-10-CM | POA: Insufficient documentation

## 2021-01-16 NOTE — Therapy (Signed)
[facility] [address] [place], [place], [address] Phone: [phone]   Fax:  [phone]  Physical Therapy Evaluation  Patient Details  Name: Judy Hanna MRN: [id number] Date of Birth: [birth year] Referring Provider (PT): Aletha Halim,   Encounter Date: 01/16/2021   PT End of Session - 01/16/21 1232     Visit Number 1    Date for PT Re-Evaluation 04/17/21    Authorization Type Pt self pay    PT Start Time 1152   therapist checked in later after starting evaluation   PT Stop Time 1230    PT Time Calculation (min) 38 min    Activity Tolerance Patient tolerated treatment well    Behavior During Therapy [facility] for tasks assessed/performed             Past Medical History:  Diagnosis Date   BMI 50.0-59.9, adult ([place]) 06/26/2020   Fibroids     History reviewed. No pertinent surgical history.  There were no vitals filed for this visit.    Subjective Assessment - 01/16/21 1158     Subjective Pt reports urinary incontinence with sneezing, laughing, coughing, with increased urgency, increased frequency during the day every hour, nighttime urination 5-6x at least but if she does not wake up to urinate with not be able to hold it to get to the bathroom. Pt reports she has history of cycts and fibroids which may be pressing on her bladder.    How long can you sit comfortably? no limits    How long can you stand comfortably? no limits    How long can you walk comfortably? no limits    Patient Stated Goals to has less leakage    Currently in Pain? No/denies                [facility] PT Assessment - 01/16/21 0001       Assessment   Medical Diagnosis N39.46 (ICD-10-CM) - Mixed stress and urge urinary incontinence   and pelvic pain    Referring Provider (PT) Ilda Basset, Charlie,    Onset Date/Surgical Date --   at least one year ago but has gotten worse   Prior Therapy no      Precautions   Precautions None       Restrictions   Weight Bearing Restrictions No      Balance Screen   Has the patient fallen in the past 6 months No    Has the patient had a decrease in activity level because of a fear of falling?  No    Is the patient reluctant to leave their home because of a fear of falling?  No      Home Ecologist residence    Living Arrangements Other relatives      Prior Function   Level of Independence Independent    Vocation Full time employment    Vocation Requirements CNA      Cognition   Overall Cognitive Status Within Functional Limits for tasks assessed      Sensation   Light Touch Appears Intact      Coordination   Gross Motor Movements are Fluid and Coordinated Yes    Fine Motor Movements are Fluid and Coordinated Yes      Posture/Postural Control   Posture/Postural Control No significant limitations      ROM / Strength   AROM / PROM / Strength AROM;Strength      AROM   Overall  AROM Comments thoracic and lumbar spine limited in side bending by 25% all others [facility]      Strength   Overall Strength Comments bil hip abduction and extension 4/5 and all other LE 5/5      Flexibility   Soft Tissue Assessment /Muscle Length yes   bil adduction and hamstring limited by 25%     Palpation   Spinal mobility decreased rib expansion noted and chest breathing pattern as well.                  No emotional/communication barriers or cognitive limitation. Patient is motivated to learn. Patient understands and agrees with treatment goals and plan. PT explains patient will be examined in standing, sitting, and lying down to see how their muscles and joints work. When they are ready, they will be asked to remove their underwear so PT can examine their perineum. The patient is also given the option of providing their own chaperone as one is not provided in our facility. The patient also has the right and is explained the right to defer or refuse any  part of the evaluation or treatment including the internal exam. With the patient's consent, PT will use one gloved finger to gently assess the muscles of the pelvic floor, seeing how well it contracts and relaxes and if there is muscle symmetry. After, the patient will get dressed and PT and patient will discuss exam findings and plan of care. PT and patient discuss plan of care, schedule, attendance policy and HEP activities.       Objective measurements completed on examination: See above findings.     Pelvic Floor Special Questions - 01/16/21 0001     Prior Pelvic/Prostate Exam No    Are you Pregnant or attempting pregnancy? No    Prior Pregnancies Yes    Number of Pregnancies 1    Number of C-Sections 0    Number of Vaginal Deliveries 0    Currently Sexually Active No    History of sexually transmitted disease No    Marinoff Scale no problems   however does report she has pain with penetration previously   Urinary Leakage Yes    How often daily    Pad use yes- 1-2 when in community, recently has had bronchitis with increased coughing and currently goes through 4 depends daily    Activities that cause leaking With strong urge;Coughing;Sneezing;Laughing;Lifting    Urinary urgency Yes    Urinary frequency yes   every hour during the day and gets up at least 5-6x per night   Fecal incontinence No    Fluid intake reports she does not drink enough water    Caffeine beverages no    Falling out feeling (prolapse) No    External Perineal Exam WFL    Prolapse None    Pelvic Floor Internal Exam patient identified and patient confirms consent for PT to perform internal soft tissue work and muscle strength and integrity assessment    Exam Type Vaginal    Sensation WFL    Palpation mild TTP per pt at pubic bone and around bladder externally and Rt adductor proximally externally; no pain internally.    Strength fair squeeze, definite lift    Strength # of reps 4    Strength # of seconds 3     Tone decreased                       PT Education -  01/16/21 1231     Education Details pt educated on exam findings, POC, urge drill, bladder irritants, and the knack method.    Person(s) Educated Patient    Methods Explanation;Demonstration;Tactile cues;Verbal cues;Handout    Comprehension Verbalized understanding;Returned demonstration              PT Short Term Goals - 01/16/21 1245       PT SHORT TERM GOAL #1   Title pt to be I with HEP    Time 4    Period Weeks    Status New    Target Date 02/13/21      PT SHORT TERM GOAL #2   Title pt to report no more than 1 leak per day with activity to demonstrate decreased urinary leakage.    Time 4    Period Weeks    Status New    Target Date 02/13/21      PT SHORT TERM GOAL #3   Title pt to demonstrate improved coordination with breathing pattern pelvic floor/core contract/relaxation to decrease strain on pelvic floor at least 50% of the time.    Time 4    Period Weeks    Status New    Target Date 02/13/21      PT SHORT TERM GOAL #4   Title pt to demonstrate at least 4/5 pelvic floor strength to decrease urinary leakage.               PT Long Term Goals - 01/16/21 1247       PT LONG TERM GOAL #1   Title Pt to be I with advanced HEP    Time 8    Period Weeks    Status New    Target Date 03/13/21      PT LONG TERM GOAL #2   Title pt to report no more than 1 leak per day with activity to demonstrate decreased urinary leakage    Time 8    Period Weeks    Status New    Target Date 03/13/21      PT LONG TERM GOAL #3   Title pt to demonstrate improved coordination with breathing pattern pelvic floor/core contract/relaxation to decrease strain on pelvic floor at least 75% of the time.    Time 8    Period Weeks    Target Date 03/13/21      PT LONG TERM GOAL #4   Title pt to demonstrate at least 5/5 pelvic floor strength to decrease urinary leakage.    Time 8    Period Weeks    Status  New    Target Date 03/13/21                    Plan - 01/16/21 1233     Clinical Impression Statement Pt is 35yo female presenting to clinic with chronic history of urinary incontinence with laughing, coughing, sneezing, lifting and with strong urge. Pt does have increased urgency and frequency as well reporting she urinates at least every hour during the day and needs to get up 5-6x per night to urinate and if does not get up to urinate at night will not be able to make it to bathroom without leakage. 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 Pt also wears pads daily changing prior to bronchitis 1-2x per day, however has had bronchitis and requires 3-4 depends daily with increased leakage with coughing.  Pt found to have decreased spinal mobility mildly, mild TTP at abdomen at pubic bone and mid lower quadrant, bil hip weakness and decreased hip flexibility, decreased rib mobility and chest breathing pattern noted. Pt consented to internal vaginal pelvic assessment and found to have weakness in all 4 quadrants in pelvic floor as well as decreased endurance and coordination. Pt did demonstrate leakage during exam with attempting to contract and attempting to bulge. Pt would benefit from continued PT to address deficits found during eval.    Personal Factors and Comorbidities Time since onset of injury/illness/exacerbation;Fitness    Examination-Activity Limitations Continence    Examination-Participation Restrictions Community Activity    Stability/Clinical Decision Making Stable/Uncomplicated    Clinical Decision Making Low    Rehab Potential Excellent    PT Frequency 1x / week    PT Duration 8 weeks    PT Treatment/Interventions ADLs/Self Care Home Management;Aquatic Therapy;Functional mobility training;Therapeutic activities;Therapeutic exercise;Neuromuscular re-education;Manual techniques;Patient/family education;Passive  range of motion;Taping    PT Next Visit Plan go over handouts and give HEP, breathing pattern with activity    PT Home Exercise Plan urge drill, the knack method, and bladder retraining    Consulted and Agree with Plan of Care Patient             Patient will benefit from skilled therapeutic intervention in order to improve the following deficits and impairments:  Decreased coordination, Increased fascial restricitons, Decreased endurance, Impaired flexibility, Improper body mechanics, Postural dysfunction, Decreased strength, Decreased mobility  Visit Diagnosis: Muscle weakness (generalized) - Plan: PT plan of care cert/re-cert  Lack of coordination - Plan: PT plan of care cert/re-cert  Abnormal posture - Plan: PT plan of care cert/re-cert     Problem List Patient Active Problem List   Diagnosis Date Noted   BMI 50.0-59.9, adult ([place]) 06/26/2020   Tobacco abuse 06/26/2020   Fibroids 05/22/2020   Axillary abscess 05/22/2020   Essential hypertension 05/22/2020   Positive depression screening 06/05/2019   Morbid obesity ([place]) 06/05/2019   Tobacco dependence 06/05/2019   Menstrual migraine without status migrainosus, not intractable 06/05/2019   History of abnormal cervical Pap smear 06/05/2019   Stacy Gardner, PT, DPT 01/17/2211:51 PM   [facility] [address], [place], [place], [address] Phone: [phone]   Fax:  [phone]  Name: Judy Hanna MRN: [id number] Date of Birth: 08-Jun-1985

## 2021-01-16 NOTE — Patient Instructions (Signed)
Bladder Irritants  Certain foods and beverages can be irritating to the bladder.  Avoiding these irritants may decrease your symptoms of urinary urgency, frequency or bladder pain.  Even reducing your intake can help with your symptoms.  Not everyone is sensitive to all bladder irritants, so you may consider focusing on one irritant at a time, removing or reducing your intake of that irritant for 7-10 days to see if this change helps your symptoms.  Water intake is also very important.  Below is a list of bladder irritants.  Drinks: alcohol, carbonated beverages, caffeinated beverages such as coffee and tea, drinks with artificial sweeteners, citrus juices, apple juice, tomato juice  Foods: tomatoes and tomato based foods, spicy food, sugar and artificial sweeteners, vinegar, chocolate, raw onion, apples, citrus fruits, pineapple, cranberries, tomatoes, strawberries, plums, peaches, cantaloupe  Other: acidic urine (too concentrated) - see water intake info below  Substitutes you can try that are NOT irritating to the bladder: cooked onion, pears, papayas, sun-brewed decaf teas, watermelons, non-citrus herbal teas, apricots, kava and low-acid instant drinks (Postum).    WATER INTAKE: Remember to drink lots of water (aim for fluid intake of half your body weight with 2/3 of fluids being water).  You may be limiting fluids due to fear of leakage, but this can actually worsen urgency symptoms due to highly concentrated urine.  Water helps balance the pH of your urine so it doesn't become too acidic - acidic urine is a bladder irritant!  Urge Incontinence  Ideal urination frequency is every 2-4 wakeful hours, which equates to 5-8 times within a 24-hour period.   Urge incontinence is leakage that occurs when the bladder muscle contracts, creating a sudden need to go before getting to the bathroom.   Going too often when your bladder isn't actually full can disrupt the body's automatic signals to  store and hold urine longer, which will increase urgency/frequency.  In this case, the bladder "is running the show" and strategies can be learned to retrain this pattern.   One should be able to control the first urge to urinate, at around 188mL.  The bladder can hold up to a "grande latte," or 4101mL. To help you gain control, practice the Urge Drill below when urgency strikes.  This drill will help retrain your bladder signals and allow you to store and hold urine longer.  The overall goal is to stretch out your time between voids to reach a more manageable voiding schedule.    Practice your "quick flicks" often throughout the day (each waking hour) even when you don't need feel the urge to go.  This will help strengthen your pelvic floor muscles, making them more effective in controlling leakage.  Urge Drill  When you feel an urge to go, follow these steps to regain control: Stop what you are doing and be still Take one deep breath, directing your air into your abdomen Think an affirming thought, such as "I've got this." Do 5 quick flicks of your pelvic floor Walk with control to the bathroom to void, or delay voiding   Relaxation Exercises with the Urge to Void   When you experience an urge to void:  FIRST  Stop and stand very still    Sit down if you can    Don't move    You need to stay very still to maintain control  SECOND Squeeze your pelvic floor muscles 5 times, like a quick flick, to keep from leaking  THIRD Relax  Take  a deep breath and then let it out  Try to make the urge go away by using relaxation and visualization techniques  FINALLY When you feel the urge go away somewhat, walk normally to the bathroom.   If the urge gets suddenly stronger on the way, you may stop again and relax to regain control.    THE KNACK  The Knack is a strategy you may use to help to reduce or prevent leakage or passing of urine, gas or feces during an activity that causes downward  force on the pelvic floor muscles.    Activities that can cause downward pressure on the pelvic floor muscles include coughing, sneezing, laughing, bending, lifting, and transitioning from different body positions such as from laying down to sitting up and sitting to standing.  To perform The Knack, consciously squeeze and lift your pelvic floor muscles to perform a strong, well-timed pelvic muscle contraction BEFORE AND DURING these activities above.  As your contraction gets more coordinated and your muscles get stronger, you will become more effective in controlling your experience of incontinence or gas passing during these activities.    [facility] [address], [place], [place] [address] Phone # [phone] Fax [phone]

## 2021-01-28 ENCOUNTER — Other Ambulatory Visit: Payer: Self-pay

## 2021-01-28 ENCOUNTER — Ambulatory Visit: Payer: Self-pay | Attending: Internal Medicine | Admitting: Internal Medicine

## 2021-01-28 ENCOUNTER — Encounter: Payer: Self-pay | Admitting: Internal Medicine

## 2021-01-28 VITALS — BP 120/82 | HR 85 | Resp 16 | Wt 377.8 lb

## 2021-01-28 DIAGNOSIS — F17200 Nicotine dependence, unspecified, uncomplicated: Secondary | ICD-10-CM

## 2021-01-28 DIAGNOSIS — Z2821 Immunization not carried out because of patient refusal: Secondary | ICD-10-CM

## 2021-01-28 DIAGNOSIS — R053 Chronic cough: Secondary | ICD-10-CM

## 2021-01-28 MED ORDER — ALBUTEROL SULFATE HFA 108 (90 BASE) MCG/ACT IN AERS
2.0000 | INHALATION_SPRAY | Freq: Four times a day (QID) | RESPIRATORY_TRACT | 0 refills | Status: DC | PRN
Start: 2021-01-28 — End: 2021-04-29
  Filled 2021-01-28: qty 8.5, 25d supply, fill #0

## 2021-01-28 MED ORDER — BENZONATATE 100 MG PO CAPS
100.0000 mg | ORAL_CAPSULE | Freq: Two times a day (BID) | ORAL | 0 refills | Status: DC | PRN
Start: 2021-01-28 — End: 2021-02-10
  Filled 2021-01-28: qty 20, 10d supply, fill #0

## 2021-01-28 NOTE — Progress Notes (Signed)
Pt is requesting a refill on Imitrex

## 2021-01-28 NOTE — Progress Notes (Signed)
Patient ID: [name], female    DOB: 1985/06/04  MRN: [id number]  CC: Hospitalization Follow-up (ED) and Cough   Subjective: [name] is a 35 y.o. female who presents for ER f/u Her concerns today include:  Patient with history of tobacco dependence, obesity, fibroids, menstrual migraines  Seen in ER 01/13/2021 with abdominal pain, shortness of breath, chest pains and diarrhea.  2 days prior she had presented to the ER with upper respiratory symptoms.  COVID test negative.  Chest x-ray normal.  Diagnosed with acute bronchitis and given Z-Pak which she completed. Today she reports that she still has wheezing and cough sometimes productive of yellow mucous. No fever.  Notice wheezing and cough most of the times at nights when laying down.  Still smoking but not as much.  Currently at 5 cigarettes a day from 1/2-1 pk a day.  Never used the patches prescribed on last visit with me 11/2020  Patient Active Problem List   Diagnosis Date Noted   BMI 50.0-59.9, adult ([place]) 06/26/2020   Tobacco abuse 06/26/2020   Fibroids 05/22/2020   Axillary abscess 05/22/2020   Essential hypertension 05/22/2020   Positive depression screening 06/05/2019   Morbid obesity ([place]) 06/05/2019   Tobacco dependence 06/05/2019   Menstrual migraine without status migrainosus, not intractable 06/05/2019   History of abnormal cervical Pap smear 06/05/2019     Current Outpatient Medications on File Prior to Visit  Medication Sig Dispense Refill   acetaminophen (TYLENOL) 500 MG tablet Take 1,000 mg by mouth every 6 (six) hours as needed for fever or headache.     naproxen (NAPROSYN) 500 MG tablet Take 1 tablet (500 mg total) by mouth 2 (two) times daily. 30 tablet 0   SUMAtriptan (IMITREX) 50 MG tablet Take 1 tablet at the start of the headache.  May repeat in 2 hours if headache does not resolve.  Max 2 hours / 24-hour. (Patient taking differently: Take 1 tablet at the start of the headache.  May repeat in 2  hours if headache does not resolve.  Max 2 hours / 24-hour.) 10 tablet 1   dextromethorphan 15 MG/5ML syrup Take 10 mLs (30 mg total) by mouth 4 (four) times daily as needed for cough. (Patient not taking: Reported on 01/28/2021) 120 mL 0   dicyclomine (BENTYL) 20 MG tablet Take 1 tablet (20 mg total) by mouth 2 (two) times daily. (Patient not taking: No sig reported) 20 tablet 0   mupirocin ointment (BACTROBAN) 2 % APPLY 1 APPLICATION TOPICALLY 2 (TWO) TIMES DAILY FOR 7 DAYS AS NEEDED. (Patient not taking: Reported on 01/28/2021) 22 g 1   nicotine (NICODERM CQ - DOSED IN MG/24 HOURS) 21 mg/24hr patch Place 1 patch (21 mg total) onto the skin daily. (Patient not taking: No sig reported) 28 patch 1   omeprazole (PRILOSEC) 20 MG capsule Take 1 capsule (20 mg total) by mouth 2 (two) times daily before a meal. (Patient not taking: Reported on 01/28/2021) 60 capsule 0   No current facility-administered medications on file prior to visit.    No Known Allergies  Social History   Socioeconomic History   Marital status: Single    Spouse name: Not on file   Number of children: 0   Years of education: Not on file   Highest education level: Some college, no degree  Occupational History   Occupation: home health care  Tobacco Use   Smoking status: Every Day    Packs/day: 1.00  Years: 3.00    Pack years: 3.00    Types: Cigarettes   Smokeless tobacco: Never  Vaping Use   Vaping Use: Never used  Substance and Sexual Activity   Alcohol use: Yes    Comment: occisonally    Drug use: Not Currently    Types: Marijuana   Sexual activity: Yes    Partners: Female  Other Topics Concern   Not on file  Social History Narrative   ** Merged History Encounter **       Social Determinants of Health   Financial Resource Strain: Not on file  Food Insecurity: Not on file  Transportation Needs: Not on file  Physical Activity: Not on file  Stress: Not on file  Social Connections: Not on file  Intimate  Partner Violence: Not on file    Family History  Problem Relation Age of Onset   Hypertension Mother    Hypertension Sister    Hypertension Maternal Grandmother     No past surgical history on file.  ROS: Review of Systems Negative except as stated above  PHYSICAL EXAM: BP 120/82   Pulse 85   Resp 16   Wt (!) 377 lb 12.8 oz (171.4 kg)   LMP 12/31/2020   SpO2 97%   BMI 55.79 kg/m   Wt Readings from Last 3 Encounters:  01/28/21 (!) 377 lb 12.8 oz (171.4 kg)  01/13/21 (!) 360 lb (163.3 kg)  12/23/20 (!) 377 lb 6.4 oz (171.2 kg)    Physical Exam  General appearance - alert, well appearing, morbidly obese young African-American female and in no distress Mental status - normal mood, behavior, speech, dress, motor activity, and thought processes Neck - supple, no significant adenopathy Chest - clear to auscultation, no wheezes, rales or rhonchi, symmetric air entry Heart - normal rate, regular rhythm, normal S1, S2, no murmurs, rubs, clicks or gallops   CMP Latest Ref Rng & Units 01/13/2021 12/04/2020 08/25/2020  Glucose 70 - 99 mg/dL 108(H) 94 105(H)  BUN 6 - 20 mg/dL 7 7 8   Creatinine 0.44 - 1.00 mg/dL 0.72 0.76 0.77  Sodium 135 - 145 mmol/L 138 141 138  Potassium 3.5 - 5.1 mmol/L 3.9 4.1 3.5  Chloride 98 - 111 mmol/L 104 105 104  CO2 22 - 32 mmol/L 26 20 25   Calcium 8.9 - 10.3 mg/dL 8.8(L) 9.0 8.9  Total Protein 6.0 - 8.5 g/dL - 6.8 -  Total Bilirubin 0.0 - 1.2 mg/dL - 0.8 -  Alkaline Phos 44 - 121 IU/L - 60 -  AST 0 - 40 IU/L - 13 -  ALT 0 - 32 IU/L - 12 -   Lipid Panel     Component Value Date/Time   CHOL 185 06/05/2019 1131   TRIG 63 06/05/2019 1131   HDL 58 06/05/2019 1131   CHOLHDL 3.2 06/05/2019 1131   LDLCALC 115 (H) 06/05/2019 1131    CBC    Component Value Date/Time   WBC 9.5 01/13/2021 0936   RBC 5.35 (H) 01/13/2021 0936   HGB 14.1 01/13/2021 0936   HGB 13.5 06/05/2019 1131   HCT 41.0 01/13/2021 0936   HCT 39.5 06/05/2019 1131   PLT 317  01/13/2021 0936   PLT 374 06/05/2019 1131   MCV 76.6 (L) 01/13/2021 0936   MCV 79 06/05/2019 1131   MCH 26.4 01/13/2021 0936   MCHC 34.4 01/13/2021 0936   RDW 15.0 01/13/2021 0936   RDW 15.0 06/05/2019 1131   LYMPHSABS 2.7 08/25/2020 1936  MONOABS 0.9 08/25/2020 1936   EOSABS 0.3 08/25/2020 1936   BASOSABS 0.0 08/25/2020 1936    ASSESSMENT AND PLAN: 1. Persistent cough Inform patient that after an acute bronchitis, cough can sometimes persist for several weeks.  I do not think she needs another round of antibiotics.  I have prescribed some Tessalon Perles and albuterol inhaler.  Advised to use the albuterol as needed but I want her to try it more so at bedtime. - benzonatate (TESSALON) 100 MG capsule; Take 1 capsule (100 mg total) by mouth 2 (two) times daily as needed for cough.  Dispense: 20 capsule; Refill: 0 - albuterol (VENTOLIN HFA) 108 (90 Base) MCG/ACT inhaler; Inhale 2 puffs into the lungs every 6 (six) hours as needed for wheezing or shortness of breath.  Dispense: 8.5 g; Refill: 0  2. Influenza vaccination declined Recommended.  Patient declined.  3. Tobacco dependence Commended her on cutting down.  Still advised her to quit.  Encouraged her to use the nicotine patches when she is really ready to give a trial of quitting.   Patient was given the opportunity to ask questions.  Patient verbalized understanding of the plan and was able to repeat key elements of the plan.   No orders of the defined types were placed in this encounter.    Requested Prescriptions    No prescriptions requested or ordered in this encounter    No follow-ups on file.  Karle Plumber, MD, FACP

## 2021-01-29 ENCOUNTER — Encounter: Payer: Self-pay | Admitting: Obstetrics and Gynecology

## 2021-01-29 ENCOUNTER — Other Ambulatory Visit: Payer: Self-pay

## 2021-01-29 ENCOUNTER — Ambulatory Visit (INDEPENDENT_AMBULATORY_CARE_PROVIDER_SITE_OTHER): Payer: Self-pay | Admitting: Obstetrics and Gynecology

## 2021-01-29 VITALS — BP 103/71 | HR 103 | Ht 69.0 in | Wt 310.8 lb

## 2021-01-29 DIAGNOSIS — Z6841 Body Mass Index (BMI) 40.0 and over, adult: Secondary | ICD-10-CM

## 2021-01-29 DIAGNOSIS — D219 Benign neoplasm of connective and other soft tissue, unspecified: Secondary | ICD-10-CM

## 2021-01-29 NOTE — Progress Notes (Signed)
CC; fibroid discussion Subjective:    Patient ID: Judy Hanna, female    DOB: [birth year], 35 y.o.   MRN: [id number]  HPI 35 yo G3P0 seen for discussion of treatment options for symptomatic uterine fibroids.  Pt has noted symptoms  for 2-3 years, but worse over the last year. Pt notes crampy pain, heavy bleeding and pressure symptoms.  Menses are regular lasting 4-5 days. Menses is heaviest the first 3 days. On the heaviest day the pt uses 5 pads and 4 tampons together.  Pt passes clots. Pt notes mild weakness, no hx of syncope.    Review of Systems  Constitutional:  Positive for fatigue.  Gastrointestinal:  Positive for constipation. Negative for diarrhea.  Genitourinary:  Positive for menstrual problem and pelvic pain.      Objective:   Physical Exam Vitals:   01/29/21 1602  BP: 103/71  Pulse: (!) 103   CLINICAL DATA:  Initial evaluation for acute left lower quadrant pain.   EXAM: TRANSABDOMINAL AND TRANSVAGINAL ULTRASOUND OF PELVIS   DOPPLER ULTRASOUND OF OVARIES   TECHNIQUE: Both transabdominal and transvaginal ultrasound examinations of the pelvis were performed. Transabdominal technique was performed for global imaging of the pelvis including uterus, ovaries, adnexal regions, and pelvic cul-de-sac.   It was necessary to proceed with endovaginal exam following the transabdominal exam to visualize the uterus, endometrium, and ovaries. Color and duplex Doppler ultrasound was utilized to evaluate blood flow to the ovaries.   COMPARISON:  None available.   FINDINGS: Uterus   Measurements: 14.9 x 7.4 x 9.1 cm = volume: 524.2 mL. 8.8 x 6.5 x 8.5 cm exophytic fibroid extends from the right uterine fundus. Additional large fibroid centered at the left uterine body measures 7.2 x 6.7 x 7.7 cm   Endometrium   Thickness: 8.9 mm. Endometrial stripe somewhat difficult to visualize due to overlying fibroids. No definite focal abnormality.   Right ovary   Measurements:  3.1 x 1.9 x 2.9 cm = volume: 8.5 mL. Normal appearance/no adnexal mass.   Left ovary   Measurements: 3.2 x 3.0 x 3.2 cm = volume: 50.9 mL. 2.0 x 1.2 x 1.8 cm simple cyst, most consistent with a normal physiologic follicular cyst/dominant follicle.   Pulsed Doppler evaluation of both ovaries demonstrates normal low-resistance arterial and venous waveforms.   Other findings   No abnormal free fluid.   IMPRESSION: 1. Enlarged fibroid uterus as detailed above. 2. 2 cm simple left ovarian cyst, most consistent with a normal physiologic follicular cyst/dominant follicle. No followup imaging recommended. note: This recommendation does not apply to premenarchal patients or to those with increased risk (genetic, family history, elevated tumor markers or other high-risk factors) of ovarian cancer. Reference: Radiology 2019 Nov; 293(2):359-371. 3. No evidence for ovarian torsion or other acute abnormality.    SVE: enlarged uterus with obvious subserosal component, mobile      Assessment & Plan:   1. BMI 50.0-59.9, adult ([place])   2. Fibroids Options for treatment"    [place]:  have checked with the rep and the secondary exophytic fibroid is concerning. Too much of the fibroid may extend into the abdominal cavity to allow it to be treated.   Pt currently has no insurance and will need to apply for financial assistance. Fibroid embolization:  can possibly treat both fibroids; however, childbearing would not be recommended afterwards. Myomectomy of subserosal fibroid: possibly accomplished with open or laparoscopic approach Hysterectomy: technically difficult due to body habitus and large uterine size, also pt would  need to forgo childbearing Will check with scheduler regarding financial assistance and then reach out to the patient.    Griffin Basil, MD Faculty Attending, [facility]

## 2021-01-31 ENCOUNTER — Emergency Department (HOSPITAL_COMMUNITY)
Admission: EM | Admit: 2021-01-31 | Discharge: 2021-01-31 | Disposition: A | Discharge status: Home / Self Care | Payer: Self-pay | Attending: Emergency Medicine | Admitting: Emergency Medicine

## 2021-01-31 ENCOUNTER — Other Ambulatory Visit: Payer: Self-pay

## 2021-01-31 ENCOUNTER — Emergency Department (HOSPITAL_COMMUNITY): Payer: Self-pay

## 2021-01-31 ENCOUNTER — Encounter (HOSPITAL_COMMUNITY): Payer: Self-pay

## 2021-01-31 DIAGNOSIS — R0789 Other chest pain: Secondary | ICD-10-CM | POA: Insufficient documentation

## 2021-01-31 DIAGNOSIS — D72829 Elevated white blood cell count, unspecified: Secondary | ICD-10-CM | POA: Insufficient documentation

## 2021-01-31 DIAGNOSIS — M546 Pain in thoracic spine: Secondary | ICD-10-CM | POA: Insufficient documentation

## 2021-01-31 DIAGNOSIS — Z79899 Other long term (current) drug therapy: Secondary | ICD-10-CM | POA: Insufficient documentation

## 2021-01-31 DIAGNOSIS — I10 Essential (primary) hypertension: Secondary | ICD-10-CM | POA: Insufficient documentation

## 2021-01-31 DIAGNOSIS — R059 Cough, unspecified: Secondary | ICD-10-CM | POA: Insufficient documentation

## 2021-01-31 DIAGNOSIS — R052 Subacute cough: Secondary | ICD-10-CM

## 2021-01-31 DIAGNOSIS — F17210 Nicotine dependence, cigarettes, uncomplicated: Secondary | ICD-10-CM | POA: Insufficient documentation

## 2021-01-31 LAB — D-DIMER, QUANTITATIVE: D-Dimer, Quant: <0.27 ug/mL-FEU (ref 0.00–0.50)

## 2021-01-31 LAB — CBC WITH DIFFERENTIAL/PLATELET
Abs Immature Granulocytes: 0.03 10*3/uL (ref 0.00–0.07)
Basophils Absolute: 0 10*3/uL (ref 0.0–0.1)
Basophils Relative: 0 %
Eosinophils Absolute: 0.1 10*3/uL (ref 0.0–0.5)
Eosinophils Relative: 1 %
HCT: 39.4 % (ref 36.0–46.0)
Hemoglobin: 13.6 g/dL (ref 12.0–15.0)
Immature Granulocytes: 0 %
Lymphocytes Relative: 22 %
Lymphs Abs: 2.6 10*3/uL (ref 0.7–4.0)
MCH: 26.2 pg (ref 26.0–34.0)
MCHC: 34.5 g/dL (ref 30.0–36.0)
MCV: 75.8 fL — ABNORMAL LOW (ref 80.0–100.0)
Monocytes Absolute: 0.7 10*3/uL (ref 0.1–1.0)
Monocytes Relative: 6 %
Neutro Abs: 8.7 10*3/uL — ABNORMAL HIGH (ref 1.7–7.7)
Neutrophils Relative %: 71 %
Platelets: 419 10*3/uL — ABNORMAL HIGH (ref 150–400)
RBC: 5.2 MIL/uL — ABNORMAL HIGH (ref 3.87–5.11)
RDW: 14.9 % (ref 11.5–15.5)
WBC: 12.2 10*3/uL — ABNORMAL HIGH (ref 4.0–10.5)
nRBC: 0 % (ref 0.0–0.2)

## 2021-01-31 LAB — COMPREHENSIVE METABOLIC PANEL
ALT: 15 U/L (ref 0–44)
AST: 12 U/L — ABNORMAL LOW (ref 15–41)
Albumin: 3.7 g/dL (ref 3.5–5.0)
Alkaline Phosphatase: 60 U/L (ref 38–126)
Anion gap: 9 (ref 5–15)
BUN: 8 mg/dL (ref 6–20)
CO2: 23 mmol/L (ref 22–32)
Calcium: 9.2 mg/dL (ref 8.9–10.3)
Chloride: 109 mmol/L (ref 98–111)
Creatinine, Ser: 0.64 mg/dL (ref 0.44–1.00)
GFR, Estimated: >60 mL/min (ref >60)
Glucose, Bld: 110 mg/dL — ABNORMAL HIGH (ref 70–99)
Potassium: 3.9 mmol/L (ref 3.5–5.1)
Sodium: 141 mmol/L (ref 135–145)
Total Bilirubin: 0.5 mg/dL (ref 0.3–1.2)
Total Protein: 7.2 g/dL (ref 6.5–8.1)

## 2021-01-31 LAB — I-STAT BETA HCG BLOOD, ED (MC, WL, AP ONLY): I-stat hCG, quantitative: <5 m[IU]/mL (ref <5)

## 2021-01-31 LAB — TROPONIN I (HIGH SENSITIVITY): Troponin I (High Sensitivity): 2 ng/L (ref <18)

## 2021-01-31 MED ORDER — CYCLOBENZAPRINE HCL 10 MG PO TABS
10.0000 mg | ORAL_TABLET | Freq: Every evening | ORAL | 0 refills | Status: DC | PRN
  Filled 2021-01-31: qty 10, 10d supply, fill #0

## 2021-01-31 NOTE — ED Provider Notes (Signed)
Emergency Medicine Provider Triage Evaluation Note  Judy Hanna , a 35 y.o. female who was seen in triage.  She complains of pain in the center of her chest that radiates to her back, on and off for several days.  She is having cough with sputum production.  She denies fever, chills, focal weakness paresthesia.  She was seen a couple weeks ago, twice, and had comprehensive evaluation.  She was ultimately discharged with a prescription for a Z-Pak.  Despite this treatment she has ongoing symptoms.  She states the pain in her chest and upper back is worse with movement and deep breathing.  She denies persistent shortness of breath or dyspnea on exertion.  She is not having any vomiting or dizziness.  Review of Systems  Positive: Chest pain, cough, sputum production Negative: No weakness or dizziness Physical Exam  BP (!) 143/88 (BP Location: Left Arm)   Pulse 82   Temp 98.6 F (37 C) (Oral)   Resp 18   SpO2 95%  Gen:   Awake, no distress overweight Resp:  Normal effort fair air movement without audible wheezes or rales. MSK:   Moves extremities without difficulty mild peripheral edema Other:  Nontoxic appearance, no respiratory distress.  Medical Decision Making  Medically screening exam initiated at 9:05 AM.  Appropriate orders placed.  Judy Hanna was informed that the remainder of the evaluation will be completed by another provider, this initial triage assessment does not replace that evaluation, and the importance of remaining in the ED until their evaluation is complete.  Evaluation of cardiac pulmonary abnormalities initiated   Daleen Bo, MD 01/31/21 [phone]

## 2021-01-31 NOTE — ED Notes (Signed)
Patient refused vital signs at time of discharge.

## 2021-01-31 NOTE — ED Provider Notes (Signed)
[facility] DEPT Provider Note   CSN: [id number] Arrival date & time: 01/31/21  4081     History Chief Complaint  Patient presents with   Cough   Back Pain    Judy Hanna is a 35 y.o. female with a past medical history of elevated BMI, tobacco abuse, hypertension, who presents today for evaluation of ongoing cough.  She has been seen multiple times in the emergency room and by outpatient providers for cough since 01/11/2021.  She has been treated with azithromycin and states that yesterday she did take a dose that she had leftover of steroids to see if that would help with her symptoms.  She states that the cough is improving however she is now having pain in the left side of her back.  Her pain worsens with movements of her arms, twisting motions with her torso, taking a deep breath, and coughing.  She denies any fevers.  No rashes or known injury.  Her back started hurting her about 3 days ago.  She states that she has stopped smoking completely.  She was seen by her PCP recently who gave her a prescription for albuterol and Tessalon.    HPI     Past Medical History:  Diagnosis Date   BMI 50.0-59.9, adult ([place]) 06/26/2020   Fibroids     Patient Active Problem List   Diagnosis Date Noted   BMI 50.0-59.9, adult ([place]) 06/26/2020   Tobacco abuse 06/26/2020   Fibroids 05/22/2020   Axillary abscess 05/22/2020   Essential hypertension 05/22/2020   Positive depression screening 06/05/2019   Morbid obesity ([place]) 06/05/2019   Tobacco dependence 06/05/2019   Menstrual migraine without status migrainosus, not intractable 06/05/2019   History of abnormal cervical Pap smear 06/05/2019    History reviewed. No pertinent surgical history.   OB History     Gravida  3   Para  0   Term  0   Preterm  0   AB  3   Living         SAB  0   IAB  3   Ectopic  0   Multiple      Live Births              Family History  Problem Relation  Age of Onset   Hypertension Mother    Hypertension Sister    Hypertension Maternal Grandmother     Social History   Tobacco Use   Smoking status: Every Day    Packs/day: 1.00    Years: 3.00    Pack years: 3.00    Types: Cigarettes   Smokeless tobacco: Never  Vaping Use   Vaping Use: Never used  Substance Use Topics   Alcohol use: Yes    Comment: occisonally    Drug use: Not Currently    Types: Marijuana    Home Medications Prior to Admission medications   Medication Sig Start Date End Date Taking? Authorizing Provider  cyclobenzaprine (FLEXERIL) 10 MG tablet Take 1 tablet (10 mg total) by mouth at bedtime as needed for muscle spasms. 01/31/21  Yes Lorin Glass, PA-C  acetaminophen (TYLENOL) 500 MG tablet Take 1,000 mg by mouth every 6 (six) hours as needed for fever or headache.    [provider]  albuterol (VENTOLIN HFA) 108 (90 Base) MCG/ACT inhaler Inhale 2 puffs into the lungs every 6 (six) hours as needed for wheezing or shortness of breath. 01/28/21   Ladell Pier, MD  benzonatate (TESSALON) 100 MG capsule Take 1 capsule (100 mg total) by mouth 2 (two) times daily as needed for cough. Patient not taking: Reported on 01/29/2021 01/28/21   Ladell Pier, MD  dextromethorphan 15 MG/5ML syrup Take 10 mLs (30 mg total) by mouth 4 (four) times daily as needed for cough. Patient not taking: No sig reported 01/11/21   Loeffler, Adora Fridge, PA-C  dicyclomine (BENTYL) 20 MG tablet Take 1 tablet (20 mg total) by mouth 2 (two) times daily. Patient not taking: No sig reported 04/04/20   Henderly, Britni A, PA-C  mupirocin ointment (BACTROBAN) 2 % APPLY 1 APPLICATION TOPICALLY 2 (TWO) TIMES DAILY FOR 7 DAYS AS NEEDED. Patient not taking: No sig reported 07/10/20 07/10/21  Argentina Donovan, PA-C  naproxen (NAPROSYN) 500 MG tablet Take 1 tablet (500 mg total) by mouth 2 (two) times daily. 01/13/21   Varney Biles, MD  nicotine (NICODERM CQ - DOSED IN MG/24 HOURS) 21  mg/24hr patch Place 1 patch (21 mg total) onto the skin daily. Patient not taking: No sig reported 12/02/20   Ladell Pier, MD  omeprazole (PRILOSEC) 20 MG capsule Take 1 capsule (20 mg total) by mouth 2 (two) times daily before a meal. Patient not taking: No sig reported 01/13/21   Varney Biles, MD  SUMAtriptan (IMITREX) 50 MG tablet Take 1 tablet at the start of the headache.  May repeat in 2 hours if headache does not resolve.  Max 2 hours / 24-hour. Patient taking differently: Take 1 tablet at the start of the headache.  May repeat in 2 hours if headache does not resolve.  Max 2 hours / 24-hour. 09/04/19   Ladell Pier, MD    Allergies    Patient has no known allergies.  Review of Systems   Review of Systems  Constitutional:  Negative for chills and fever.  HENT:  Negative for congestion.   Eyes:  Negative for visual disturbance.  Respiratory:  Positive for cough and chest tightness. Negative for shortness of breath.   Cardiovascular:  Positive for chest pain. Negative for palpitations and leg swelling.  Gastrointestinal:  Negative for abdominal pain.  Musculoskeletal:  Positive for back pain. Negative for neck pain.  Skin:  Negative for color change and rash.  Neurological:  Negative for weakness and headaches.  Psychiatric/Behavioral:  Negative for confusion.   All other systems reviewed and are negative.  Physical Exam Updated Vital Signs BP (!) 156/96 (BP Location: Right Arm)   Pulse 88   Temp 98.6 F (37 C) (Oral)   Resp 16   SpO2 100%   Physical Exam Vitals and nursing note reviewed.  Constitutional:      General: She is not in acute distress.    Appearance: She is not ill-appearing.  HENT:     Head: Atraumatic.  Eyes:     Conjunctiva/sclera: Conjunctivae normal.  Cardiovascular:     Rate and Rhythm: Normal rate and regular rhythm.     Pulses: Normal pulses.     Heart sounds: Normal heart sounds.     Comments: 2+ radial pulses  bilaterally. Pulmonary:     Effort: Pulmonary effort is normal. No respiratory distress.     Breath sounds: Normal breath sounds. No wheezing.  Chest:     Chest wall: Tenderness present.  Abdominal:     General: There is no distension.  Musculoskeletal:     Cervical back: Normal range of motion and neck supple.     Comments: Movements  of left arm worsen her reported pain.  She has TTP over the general distribution of the trapezius muscle.  Patient here recreates and exacerbates her reported pain.  She has increased pain with twisting of the torso.  5/5 grip strength bilaterally  Skin:    General: Skin is warm.  Neurological:     Mental Status: She is alert.     Comments: Awake and alert, answers all questions appropriately.  Speech is not slurred.  Sensation intact to light touch to bilateral arms.  Psychiatric:        Mood and Affect: Mood normal.        Behavior: Behavior normal.    ED Results / Procedures / Treatments   Labs (all labs ordered are listed, but only abnormal results are displayed) Labs Reviewed  COMPREHENSIVE METABOLIC PANEL - Abnormal; Notable for the following components:      Result Value   Glucose, Bld 110 (*)    AST 12 (*)    All other components within normal limits  CBC WITH DIFFERENTIAL/PLATELET - Abnormal; Notable for the following components:   WBC 12.2 (*)    RBC 5.20 (*)    MCV 75.8 (*)    Platelets 419 (*)    Neutro Abs 8.7 (*)    All other components within normal limits  D-DIMER, QUANTITATIVE  I-STAT BETA HCG BLOOD, ED ([facility], [facility], [facility] ONLY)  TROPONIN I (HIGH SENSITIVITY)    EKG None  Radiology DG Chest 2 View  Result Date: 01/31/2021 CLINICAL DATA:  35 year old female with chest and back pain for 3 days. EXAM: CHEST - 2 VIEW COMPARISON:  Chest radiographs 01/13/2021 and earlier. FINDINGS: Chronically low lung volumes. Mediastinal contours remain normal. Visualized tracheal air column is within normal limits. Both lungs remain clear. No  pneumothorax or pleural effusion. No osseous abnormality identified.  Negative visible bowel gas. IMPRESSION: Chronically low lung volumes.  No acute cardiopulmonary abnormality. Electronically Signed   By: Genevie Ann M.D.   On: 01/31/2021 09:09    Procedures Procedures   Medications Ordered in ED Medications - No data to display  ED Course  I have reviewed the triage vital signs and the nursing notes.  Pertinent labs & imaging results that were available during my care of the patient were reviewed by me and considered in my medical decision making (see chart for details).  Clinical Course as of 01/31/21 1611  Fri Jan 31, 2021  1540 WBC(!): 12.2 Patient took a dose of steroids yesterday, leftover prednisone [EH]    Clinical Course User Index [EH] Ollen Gross   MDM Rules/Calculators/A&P                         Patient is a 35 year old woman who presents today for evaluation of ongoing cough and back pain.  The cough is ongoing for multiple weeks however the back pain is new over the past 3 days. There is pain with movements of the left arm.  She does not have a specific traumatic events.  Additionally I am able to recreate her pain with palpation of the chest wall.  She is neurovascularly intact.  Labs were obtained, troponin is not elevated, given her pain has been ongoing for 3 days no indication for delta.  hCG is negative, D-dimer is not elevated.  CMP is unremarkable.  CBC does show a slight leukocytosis with a white count of 12.2, however she did take steroids yesterday to see  if that will help her pain and I suspect that that is causing her mild leukocytosis. Her blood pressure was elevated while in the emergency room.  When she was seen recently at PCP she was normotensive.  Patient is informed of this and the need to get this rechecked and states her understanding.  Here she is 100% on room air, chest x-ray does not show pneumonia, consolidation, pneumothorax or other  cause for her symptoms.  Suspect musculoskeletal chest pain in the setting of ongoing cough. Will trial nightly Flexeril.  Precautions given for taking this medication.   Return precautions were discussed with patient who states their understanding.  At the time of discharge patient denied any unaddressed complaints or concerns.  Patient is agreeable for discharge home.  Note: Portions of this report may have been transcribed using voice recognition software. Every effort was made to ensure accuracy; however, inadvertent computerized transcription errors may be present    Final Clinical Impression(s) / ED Diagnoses Final diagnoses:  Subacute cough  Acute left-sided thoracic back pain    Rx / DC Orders ED Discharge Orders          Ordered    cyclobenzaprine (FLEXERIL) 10 MG tablet  At bedtime PRN        01/31/21 1607             Lorin Glass, [place] 01/31/21 1611    Teressa Lower, MD 01/31/21 1726

## 2021-01-31 NOTE — Discharge Instructions (Addendum)
While in the emergency room your blood pressure was elevated.  It appears that your recent primary care doctor concerned it was not elevated however I would recommend that you follow-up with your primary care doctor to get this reassessed in the next few months.  As we discussed today I suspect that your pain is related to coughing and pain in the muscles and bones.  Please take Naproxen (aleve) and Tylenol (acetaminophen) to relieve your pain.  You may take two pills of aleve every 12 hours.  After 2-3 days please try to only take one pill every 12 hours.  In between doses of aleve you may take tylenol, up to 1,000 mg (two extra strength pills).  Do not take more than 3,000 mg tylenol in a 24 hour period.  Please check all medication labels as many medications such as pain and cold medications may contain tylenol.  Do not drink alcohol while taking these medications.  Do not take other NSAID'S while taking aleve (naproxen) (such as motrin, ibuprofen, voltaren or advil).  Please take aleve (naproxen) with food to decrease stomach upset.  If the aleve irritates your stomach you may stop and get voltaren gel.  This is over the counter and follow directions on the box.   The best way to get rid of muscle pain is by taking NSAIDS, using heat, massage therapy, and gentle stretching/range of motion exercises.  You are being prescribed a medication which may make you sleepy. For 24 hours after one dose please do not drive, operate heavy machinery, care for a small child with out another adult present, or perform any activities that may cause harm to you or someone else if you were to fall asleep or be impaired.

## 2021-01-31 NOTE — ED Triage Notes (Signed)
Pt arrived via POV, c/o productive painful cough and back pain. States yellow sputum. Has been seen by PCP and in ED mid sept for same with little relief.

## 2021-02-03 ENCOUNTER — Ambulatory Visit: Payer: Self-pay | Attending: Obstetrics and Gynecology | Admitting: Physical Therapy

## 2021-02-03 DIAGNOSIS — R293 Abnormal posture: Secondary | ICD-10-CM | POA: Insufficient documentation

## 2021-02-03 DIAGNOSIS — M6281 Muscle weakness (generalized): Secondary | ICD-10-CM | POA: Insufficient documentation

## 2021-02-03 DIAGNOSIS — R279 Unspecified lack of coordination: Secondary | ICD-10-CM | POA: Insufficient documentation

## 2021-02-04 ENCOUNTER — Other Ambulatory Visit: Payer: Self-pay

## 2021-02-06 ENCOUNTER — Telehealth: Payer: Self-pay | Admitting: *Deleted

## 2021-02-06 NOTE — Telephone Encounter (Signed)
Call to patient to discuss surgery options. No answer and voice mail is not set up.  Will send My Chart message.

## 2021-02-07 ENCOUNTER — Other Ambulatory Visit: Payer: Self-pay

## 2021-02-10 ENCOUNTER — Ambulatory Visit: Payer: Self-pay | Attending: Nurse Practitioner | Admitting: Nurse Practitioner

## 2021-02-10 ENCOUNTER — Other Ambulatory Visit: Payer: Self-pay

## 2021-02-10 ENCOUNTER — Encounter: Payer: Self-pay | Admitting: Nurse Practitioner

## 2021-02-10 ENCOUNTER — Encounter: Payer: Self-pay | Admitting: Physical Therapy

## 2021-02-10 VITALS — BP 130/91 | HR 96 | Ht 69.0 in | Wt 381.4 lb

## 2021-02-10 DIAGNOSIS — F17200 Nicotine dependence, unspecified, uncomplicated: Secondary | ICD-10-CM

## 2021-02-10 DIAGNOSIS — J410 Simple chronic bronchitis: Secondary | ICD-10-CM

## 2021-02-10 DIAGNOSIS — G43829 Menstrual migraine, not intractable, without status migrainosus: Secondary | ICD-10-CM

## 2021-02-10 MED ORDER — PREDNISONE 10 MG PO TABS
ORAL_TABLET | ORAL | 0 refills | Status: DC
Start: 2021-02-10 — End: 2021-04-29
  Filled 2021-02-10 (×2): qty 35, 14d supply, fill #0

## 2021-02-10 MED ORDER — NICOTINE 21 MG/24HR TD PT24
21.0000 mg | MEDICATED_PATCH | Freq: Every day | TRANSDERMAL | 1 refills | Status: DC
  Filled 2021-02-10: qty 28, 28d supply, fill #0

## 2021-02-10 MED ORDER — SUMATRIPTAN SUCCINATE 50 MG PO TABS
ORAL_TABLET | ORAL | 1 refills | Status: DC
Start: 2021-02-10 — End: 2023-01-04
  Filled 2021-02-10 – 2021-12-11 (×2): qty 10, 30d supply, fill #0

## 2021-02-10 NOTE — Progress Notes (Signed)
Assessment & Plan:  Judy Hanna was seen today for cough.  Diagnoses and all orders for this visit:  Simple chronic bronchitis (HCC) -     predniSONE (STERAPRED UNI-PAK 21 TAB) 5 MG (21) TBPK tablet; Take 4 tablets (40 mg total) by mouth daily with breakfast for 4 days, THEN 3 tablets (30 mg total) daily with breakfast for 3 days, THEN 2 tablets (20 mg total) daily with breakfast for 3 days, THEN 1 tablet (10 mg total) daily with breakfast for 2 days, THEN 0.5 tablets (5 mg total) daily with breakfast for 2 days.  Tobacco dependence -     nicotine (NICODERM CQ - DOSED IN MG/24 HOURS) 21 mg/24hr patch; Place 1 patch (21 mg total) onto the skin daily.  Menstrual migraine without status migrainosus, not intractable -     SUMAtriptan (IMITREX) 50 MG tablet; Take 1 tablet at the start of the headache.  May repeat in 2 hours if headache does not resolve.  Max 2 hours / 24-hour.   Patient has been counseled on age-appropriate routine health concerns for screening and prevention. These are reviewed and up-to-date. Referrals have been placed accordingly. Immunizations are up-to-date or declined.    Subjective:   Chief Complaint  Patient presents with   Cough   Cough Pertinent negatives include no chest pain, fever, headaches, heartburn, myalgias, shortness of breath or weight loss.  Judy Hanna 35 y.o. female presents to office today for chronic cough. She has a past medical history of BMI 50.0-59.9, adult ([place]) (06/26/2020) and Fibroids.   Chronic Bronchitis She has chronic cough. Treatments tried in the past include: tessalon, albuterol inhaler, zpak, cough syrup, flexeril Today still with chest congestion and cough. Notes yellow sputum requesting zpak however we will treat with tapering steroids today instead.  She has not completely stopped smoking cigarettes: down to 3 cigarettes per day from 1-1.5 ppd. CXR 01-31-2021 NEGATIVE She denies any previous history of asthma.  She may need PFT or  spirometry if cough not resolved with tapering steroids.    Review of Systems  Constitutional:  Negative for fever, malaise/fatigue and weight loss.  HENT: Negative.  Negative for nosebleeds.   Eyes: Negative.  Negative for blurred vision, double vision and photophobia.  Respiratory:  Positive for cough. Negative for shortness of breath.   Cardiovascular: Negative.  Negative for chest pain, palpitations and leg swelling.  Gastrointestinal: Negative.  Negative for heartburn, nausea and vomiting.  Musculoskeletal: Negative.  Negative for myalgias.  Neurological: Negative.  Negative for dizziness, focal weakness, seizures and headaches.  Psychiatric/Behavioral: Negative.  Negative for suicidal ideas.    Past Medical History:  Diagnosis Date   BMI 50.0-59.9, adult ([place]) 06/26/2020   Fibroids     History reviewed. No pertinent surgical history.  Family History  Problem Relation Age of Onset   Hypertension Mother    Hypertension Sister    Hypertension Maternal Grandmother     Social History Reviewed with no changes to be made today.   Outpatient Medications Prior to Visit  Medication Sig Dispense Refill   acetaminophen (TYLENOL) 500 MG tablet Take 1,000 mg by mouth every 6 (six) hours as needed for fever or headache.     albuterol (VENTOLIN HFA) 108 (90 Base) MCG/ACT inhaler Inhale 2 puffs into the lungs every 6 (six) hours as needed for wheezing or shortness of breath. 8.5 g 0   cyclobenzaprine (FLEXERIL) 10 MG tablet Take 1 tablet (10 mg total) by mouth at bedtime as needed for  muscle spasms. 10 tablet 0   naproxen (NAPROSYN) 500 MG tablet Take 1 tablet (500 mg total) by mouth 2 (two) times daily. 30 tablet 0   omeprazole (PRILOSEC) 20 MG capsule Take 1 capsule (20 mg total) by mouth 2 (two) times daily before a meal. 60 capsule 0   nicotine (NICODERM CQ - DOSED IN MG/24 HOURS) 21 mg/24hr patch Place 1 patch (21 mg total) onto the skin daily. 28 patch 1   SUMAtriptan (IMITREX) 50  MG tablet Take 1 tablet at the start of the headache.  May repeat in 2 hours if headache does not resolve.  Max 2 hours / 24-hour. (Patient taking differently: Take 1 tablet at the start of the headache.  May repeat in 2 hours if headache does not resolve.  Max 2 hours / 24-hour.) 10 tablet 1   benzonatate (TESSALON) 100 MG capsule Take 1 capsule (100 mg total) by mouth 2 (two) times daily as needed for cough. (Patient not taking: Reported on 01/29/2021) 20 capsule 0   dextromethorphan 15 MG/5ML syrup Take 10 mLs (30 mg total) by mouth 4 (four) times daily as needed for cough. (Patient not taking: No sig reported) 120 mL 0   dicyclomine (BENTYL) 20 MG tablet Take 1 tablet (20 mg total) by mouth 2 (two) times daily. (Patient not taking: No sig reported) 20 tablet 0   mupirocin ointment (BACTROBAN) 2 % APPLY 1 APPLICATION TOPICALLY 2 (TWO) TIMES DAILY FOR 7 DAYS AS NEEDED. (Patient not taking: No sig reported) 22 g 1   No facility-administered medications prior to visit.    No Known Allergies     Objective:    BP (!) 130/91   Pulse 96   Ht 5\' 9"  (1.753 m)   Wt (!) 381 lb 6 oz (173 kg)   LMP 02/07/2021 (Exact Date)   SpO2 99%   BMI 56.32 kg/m  Wt Readings from Last 3 Encounters:  02/10/21 (!) 381 lb 6 oz (173 kg)  01/29/21 (!) 310 lb 12.8 oz (141 kg)  01/28/21 (!) 377 lb 12.8 oz (171.4 kg)    Physical Exam Vitals and nursing note reviewed.  Constitutional:      Appearance: She is well-developed.  HENT:     Head: Normocephalic and atraumatic.  Cardiovascular:     Rate and Rhythm: Normal rate and regular rhythm.     Heart sounds: Normal heart sounds. No murmur heard.   No friction rub. No gallop.  Pulmonary:     Effort: Pulmonary effort is normal. No tachypnea or respiratory distress.     Breath sounds: Normal breath sounds and air entry. No decreased breath sounds, wheezing, rhonchi or rales.  Chest:     Chest wall: No tenderness.  Abdominal:     General: Bowel sounds are  normal.     Palpations: Abdomen is soft.  Musculoskeletal:        General: Normal range of motion.     Cervical back: Normal range of motion.  Skin:    General: Skin is warm and dry.  Neurological:     Mental Status: She is alert and oriented to person, place, and time.     Coordination: Coordination normal.  Psychiatric:        Behavior: Behavior normal. Behavior is cooperative.        Thought Content: Thought content normal.        Judgment: Judgment normal.         Patient has been counseled extensively about  nutrition and exercise as well as the importance of adherence with medications and regular follow-up. The patient was given clear instructions to go to ER or return to medical center if symptoms don't improve, worsen or new problems develop. The patient verbalized understanding.   Follow-up: Return for in 3 weeks tele with Dr. Wynetta Emery for cough after taking prednisone.   Gildardo Pounds, FNP-BC [facility], [place]   02/10/2021, 10:31 AM

## 2021-02-14 ENCOUNTER — Emergency Department (HOSPITAL_COMMUNITY)
Admission: EM | Admit: 2021-02-14 | Discharge: 2021-02-14 | Discharge status: Against Medical Advice | Payer: Self-pay | Attending: Emergency Medicine | Admitting: Emergency Medicine

## 2021-02-14 DIAGNOSIS — Z5321 Procedure and treatment not carried out due to patient leaving prior to being seen by health care provider: Secondary | ICD-10-CM | POA: Insufficient documentation

## 2021-02-14 NOTE — ED Notes (Signed)
Pt called for triage X3, no response

## 2021-02-17 ENCOUNTER — Ambulatory Visit: Payer: Self-pay | Admitting: Physical Therapy

## 2021-02-17 ENCOUNTER — Other Ambulatory Visit: Payer: Self-pay

## 2021-02-27 ENCOUNTER — Telehealth: Payer: Self-pay | Admitting: Physical Therapy

## 2021-02-27 ENCOUNTER — Ambulatory Visit: Payer: Self-pay | Attending: Obstetrics and Gynecology | Admitting: Physical Therapy

## 2021-02-27 DIAGNOSIS — M6281 Muscle weakness (generalized): Secondary | ICD-10-CM | POA: Insufficient documentation

## 2021-02-27 DIAGNOSIS — R293 Abnormal posture: Secondary | ICD-10-CM | POA: Insufficient documentation

## 2021-02-27 DIAGNOSIS — R279 Unspecified lack of coordination: Secondary | ICD-10-CM | POA: Insufficient documentation

## 2021-02-27 NOTE — Telephone Encounter (Signed)
PT called pt about this afternoon's appointment at 1400. PT spoke to pt on the phone about missed appointment and she reported she had called to cancel all appointments previously due to schedule conflicts with work. Pt reported she will call back to reschedule.   Stacy Gardner, PT, DPT 11/03/222:26 PM

## 2021-02-28 ENCOUNTER — Other Ambulatory Visit: Payer: Self-pay

## 2021-02-28 ENCOUNTER — Ambulatory Visit: Payer: Self-pay | Attending: Internal Medicine | Admitting: Internal Medicine

## 2021-02-28 DIAGNOSIS — R053 Chronic cough: Secondary | ICD-10-CM

## 2021-02-28 DIAGNOSIS — F17200 Nicotine dependence, unspecified, uncomplicated: Secondary | ICD-10-CM

## 2021-02-28 MED ORDER — NICOTINE POLACRILEX 2 MG MT GUM
2.0000 mg | CHEWING_GUM | OROMUCOSAL | 0 refills | Status: DC | PRN
  Filled 2021-02-28: qty 100, fill #0

## 2021-02-28 NOTE — Progress Notes (Signed)
Patient ID: Judy Hanna, female   DOB: 1985-08-01, 35 y.o.   MRN: [id number] Virtual Visit via Telephone Note  I connected with [name] on 02/28/2021 at 1:31 p.m by telephone and verified that I am speaking with the correct person using two identifiers  Location: Patient: home Provider: office  Participants: Myself Patient    I discussed the limitations, risks, security and privacy concerns of performing an evaluation and management service by telephone and the availability of in person appointments. I also discussed with the patient that there may be a patient responsible charge related to this service. The patient expressed understanding and agreed to proceed.   History of Present Illness: Patient with history of tobacco dependence, obesity, fibroids, menstrual migraines.  This is a follow-up visit for cough.  Since she last saw me, patient was seen in the emergency room with pleuritic chest pain and cough.  Chest x-ray was negative.  D-dimer negative.  Subsequently saw the nurse practitioner and was prescribed prednisone for bronchitis. Pt reports cough is better.  "My lungs no longer hurt." Comes back ever so often but not like it was Smoking more about 1/2 pk a day.  Tells me that she is still committed to quitting smoking.  She has not come by the pharmacy as yet to get the nicotine patches because by the time she gets off work the pharmacy is closed.   Outpatient Encounter Medications as of 02/28/2021  Medication Sig   acetaminophen (TYLENOL) 500 MG tablet Take 1,000 mg by mouth every 6 (six) hours as needed for fever or headache.   albuterol (VENTOLIN HFA) 108 (90 Base) MCG/ACT inhaler Inhale 2 puffs into the lungs every 6 (six) hours as needed for wheezing or shortness of breath.   cyclobenzaprine (FLEXERIL) 10 MG tablet Take 1 tablet (10 mg total) by mouth at bedtime as needed for muscle spasms.   naproxen (NAPROSYN) 500 MG tablet Take 1 tablet (500 mg total) by mouth 2  (two) times daily.   nicotine (NICODERM CQ - DOSED IN MG/24 HOURS) 21 mg/24hr patch Place 1 patch (21 mg total) onto the skin daily.   omeprazole (PRILOSEC) 20 MG capsule Take 1 capsule (20 mg total) by mouth 2 (two) times daily before a meal.   predniSONE (DELTASONE) 10 MG tablet Take 4 tablets (40 mg total) by mouth daily with breakfast for 4 days, THEN 3 tablets (30 mg total) daily with breakfast for 3 days, THEN 2 tablets (20 mg total) daily with breakfast for 3 days, THEN 1 tablet (10 mg total) daily with breakfast for 2 days, THEN 0.5 tablets (5 mg total) daily with breakfast for 2 days.   SUMAtriptan (IMITREX) 50 MG tablet Take 1 tablet at the start of the headache.  May repeat in 2 hours if headache does not resolve.  Max 2 hours / 24-hour.   No facility-administered encounter medications on file as of 02/28/2021.      Observations/Objective: No direct observation done as this was a telephone visit.  Assessment and Plan: 1. Persistent cough Patient reports that her cough is almost resolved.  She now wants to focus on quitting smoking.  2. Tobacco dependence Advised patient that she can call the pharmacy and request that they send her the patches through the mail but she would have to pay over the phone with her credit card.  Other option given to her is calling 1 [phone] quit now to request the nicotine patches.  I also recommended the nicotine gum  for breakthrough cravings.  She is interested in the gum also.   Follow Up Instructions: PRN   I discussed the assessment and treatment plan with the patient. The patient was provided an opportunity to ask questions and all were answered. The patient agreed with the plan and demonstrated an understanding of the instructions.   The patient was advised to call back or seek an in-person evaluation if the symptoms worsen or if the condition fails to improve as anticipated.  I  Spent 6 minutes on this telephone encounter  Karle Plumber,  MD

## 2021-03-03 ENCOUNTER — Encounter: Payer: Self-pay | Admitting: Physical Therapy

## 2021-03-04 ENCOUNTER — Ambulatory Visit (HOSPITAL_COMMUNITY): Payer: No Payment, Other | Admitting: Clinical

## 2021-03-04 DIAGNOSIS — F331 Major depressive disorder, recurrent, moderate: Secondary | ICD-10-CM | POA: Insufficient documentation
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
## 2021-03-10 ENCOUNTER — Other Ambulatory Visit: Payer: Self-pay

## 2021-03-10 ENCOUNTER — Ambulatory Visit (INDEPENDENT_AMBULATORY_CARE_PROVIDER_SITE_OTHER): Payer: No Payment, Other | Admitting: Psychiatry

## 2021-03-10 ENCOUNTER — Encounter (HOSPITAL_COMMUNITY): Payer: Self-pay

## 2021-03-10 VITALS — BP 130/91 | HR 96 | Ht 69.0 in | Wt 381.0 lb

## 2021-03-10 DIAGNOSIS — F331 Major depressive disorder, recurrent, moderate: Secondary | ICD-10-CM

## 2021-03-10 DIAGNOSIS — F4310 Post-traumatic stress disorder, unspecified: Secondary | ICD-10-CM

## 2021-03-10 MED ORDER — ESCITALOPRAM OXALATE 10 MG PO TABS
10.0000 mg | ORAL_TABLET | Freq: Every day | ORAL | 2 refills | Status: DC
  Filled 2021-03-10: qty 30, 30d supply, fill #0

## 2021-03-10 MED ORDER — HYDROXYZINE HCL 10 MG PO TABS
10.0000 mg | ORAL_TABLET | Freq: Three times a day (TID) | ORAL | 2 refills | Status: DC | PRN
  Filled 2021-03-10: qty 150, 30d supply, fill #0

## 2021-03-10 NOTE — Progress Notes (Signed)
See other note
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
## 2021-03-11 ENCOUNTER — Encounter: Payer: Self-pay | Admitting: Physical Therapy

## 2021-03-17 ENCOUNTER — Other Ambulatory Visit: Payer: Self-pay

## 2021-04-07 ENCOUNTER — Other Ambulatory Visit: Payer: Self-pay

## 2021-04-07 ENCOUNTER — Encounter (HOSPITAL_COMMUNITY): Payer: Self-pay | Admitting: Psychiatry

## 2021-04-07 ENCOUNTER — Telehealth (INDEPENDENT_AMBULATORY_CARE_PROVIDER_SITE_OTHER): Payer: No Payment, Other | Admitting: Psychiatry

## 2021-04-07 DIAGNOSIS — F4310 Post-traumatic stress disorder, unspecified: Secondary | ICD-10-CM

## 2021-04-07 MED ORDER — HYDROXYZINE HCL 10 MG PO TABS
10.0000 mg | ORAL_TABLET | Freq: Three times a day (TID) | ORAL | 2 refills | Status: DC | PRN
  Filled 2021-04-07: qty 150, 30d supply, fill #0
  Filled 2021-05-12: qty 150, 50d supply, fill #0

## 2021-04-07 MED ORDER — ESCITALOPRAM OXALATE 10 MG PO TABS
10.0000 mg | ORAL_TABLET | Freq: Every day | ORAL | 2 refills | Status: DC
  Filled 2021-04-07 – 2021-05-12 (×2): qty 30, 30d supply, fill #0
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
## 2021-04-14 ENCOUNTER — Other Ambulatory Visit: Payer: Self-pay

## 2021-04-29 ENCOUNTER — Other Ambulatory Visit: Payer: Self-pay

## 2021-04-29 ENCOUNTER — Ambulatory Visit: Payer: Self-pay | Attending: Internal Medicine | Admitting: Internal Medicine

## 2021-04-29 ENCOUNTER — Encounter: Payer: Self-pay | Admitting: Internal Medicine

## 2021-04-29 ENCOUNTER — Other Ambulatory Visit (HOSPITAL_COMMUNITY): Payer: Self-pay

## 2021-04-29 ENCOUNTER — Ambulatory Visit: Payer: Self-pay

## 2021-04-29 VITALS — BP 130/84 | HR 91 | Resp 16 | Wt 383.8 lb

## 2021-04-29 DIAGNOSIS — J40 Bronchitis, not specified as acute or chronic: Secondary | ICD-10-CM

## 2021-04-29 DIAGNOSIS — R030 Elevated blood-pressure reading, without diagnosis of hypertension: Secondary | ICD-10-CM

## 2021-04-29 MED ORDER — ALBUTEROL SULFATE HFA 108 (90 BASE) MCG/ACT IN AERS
2.0000 | INHALATION_SPRAY | Freq: Four times a day (QID) | RESPIRATORY_TRACT | 1 refills | Status: DC | PRN
  Filled 2021-04-29: qty 8.5, 25d supply, fill #0
  Filled 2021-04-29: qty 8.5, fill #0

## 2021-04-29 MED ORDER — PREDNISONE 20 MG PO TABS
ORAL_TABLET | ORAL | 0 refills | Status: DC
  Filled 2021-04-29: qty 7, 6d supply, fill #0
  Filled 2021-04-29: qty 7, fill #0

## 2021-04-29 MED ORDER — BENZONATATE 100 MG PO CAPS
100.0000 mg | ORAL_CAPSULE | Freq: Two times a day (BID) | ORAL | 0 refills | Status: DC | PRN
  Filled 2021-04-29 (×2): qty 20, 10d supply, fill #0

## 2021-04-29 MED ORDER — AZITHROMYCIN 250 MG PO TABS
ORAL_TABLET | ORAL | 0 refills | Status: DC
  Filled 2021-04-29: qty 6, fill #0
  Filled 2021-04-29: qty 6, 5d supply, fill #0

## 2021-04-29 NOTE — Progress Notes (Signed)
Patient ID: Judy Hanna, female    DOB: [birth year]  MRN: [id number]  CC: cough/wheezing   Subjective: Judy Hanna is a 36 y.o. female who presents for UC visit Her concerns today include:  Patient with history of tobacco dependence, obesity, fibroids, menstrual migraines  Reports having cough 02/2021 after thanksgiving.  Had positive home COVID test at that time.  Had a lingering cough of clear phlegm Now worsening cough productive of yellow phlegm with wheezing x 3 days.  No fever or chills.  Some SOB with activity.  No one else in house sick.  Did not do COVID test.  Did not get COVID vaccine.   Wheezing is not all the time.  Using Albuterol inhaler q 2hrs but does not seem to help Patient Active Problem List   Diagnosis Date Noted   PTSD (post-traumatic stress disorder) 03/10/2021   Major depressive disorder, recurrent episode, moderate with anxious distress ([place]) 03/04/2021   BMI 50.0-59.9, adult ([place]) 06/26/2020   Tobacco abuse 06/26/2020   Fibroids 05/22/2020   Axillary abscess 05/22/2020   Essential hypertension 05/22/2020   Positive depression screening 06/05/2019   Morbid obesity ([place]) 06/05/2019   Tobacco dependence 06/05/2019   Menstrual migraine without status migrainosus, not intractable 06/05/2019   History of abnormal cervical Pap smear 06/05/2019     Current Outpatient Medications on File Prior to Visit  Medication Sig Dispense Refill   acetaminophen (TYLENOL) 500 MG tablet Take 1,000 mg by mouth every 6 (six) hours as needed for fever or headache.     cyclobenzaprine (FLEXERIL) 10 MG tablet Take 1 tablet (10 mg total) by mouth at bedtime as needed for muscle spasms. 10 tablet 0   escitalopram (LEXAPRO) 10 MG tablet Take 1 tablet (10 mg total) by mouth daily. 30 tablet 2   hydrOXYzine (ATARAX) 10 MG tablet Take 1 tablet (10 mg total) by mouth 3 (three) times daily as needed (may take one tablet as needed twice daily for anxiety, and 3 tablets at bedtime for  sleep as needed). 150 tablet 2   naproxen (NAPROSYN) 500 MG tablet Take 1 tablet (500 mg total) by mouth 2 (two) times daily. 30 tablet 0   nicotine (NICODERM CQ - DOSED IN MG/24 HOURS) 21 mg/24hr patch Place 1 patch (21 mg total) onto the skin daily. 28 patch 1   nicotine polacrilex (NICORETTE) 2 MG gum Take 1 each (2 mg total) by mouth as needed for smoking cessation. 100 tablet 0   omeprazole (PRILOSEC) 20 MG capsule Take 1 capsule (20 mg total) by mouth 2 (two) times daily before a meal. 60 capsule 0   SUMAtriptan (IMITREX) 50 MG tablet Take 1 tablet at the start of the headache.  May repeat in 2 hours if headache does not resolve.  Max 2 hours / 24-hour. 10 tablet 1   No current facility-administered medications on file prior to visit.    No Known Allergies  Social History   Socioeconomic History   Marital status: Single    Spouse name: Not on file   Number of children: 0   Years of education: Not on file   Highest education level: Some college, no degree  Occupational History   Occupation: home health care  Tobacco Use   Smoking status: Every Day    Packs/day: 1.00    Years: 3.00    Pack years: 3.00    Types: Cigarettes   Smokeless tobacco: Never  Vaping Use   Vaping Use: Never used  Substance and Sexual Activity   Alcohol use: Yes    Comment: occisonally    Drug use: Not Currently    Types: Marijuana   Sexual activity: Yes    Partners: Female  Other Topics Concern   Not on file  Social History Narrative   ** Merged History Encounter **       Social Determinants of Health   Financial Resource Strain: Not on file  Food Insecurity: Not on file  Transportation Needs: Not on file  Physical Activity: Not on file  Stress: Not on file  Social Connections: Not on file  Intimate Partner Violence: Not on file    Family History  Problem Relation Age of Onset   Hypertension Mother    Hypertension Sister    Hypertension Maternal Grandmother     No past surgical  history on file.  ROS: Review of Systems Negative except as stated above  PHYSICAL EXAM: BP 130/84    Pulse 91    Resp 16    Wt (!) 383 lb 12.8 oz (174.1 kg)    SpO2 99%    BMI 56.68 kg/m   Physical Exam  General appearance - alert, well appearing, morbidly obese young African-American female and in no distress Mental status - normal mood, behavior, speech, dress, motor activity, and thought processes Neck - supple, no significant adenopathy Chest -mild diffuse expiratory wheezes.  Good air entry. Heart -regular rate and rhythm.   CMP Latest Ref Rng & Units 01/31/2021 01/13/2021 12/04/2020  Glucose 70 - 99 mg/dL 110(H) 108(H) 94  BUN 6 - 20 mg/dL '8 7 7  ' Creatinine 0.44 - 1.00 mg/dL 0.64 0.72 0.76  Sodium 135 - 145 mmol/L 141 138 141  Potassium 3.5 - 5.1 mmol/L 3.9 3.9 4.1  Chloride 98 - 111 mmol/L 109 104 105  CO2 22 - 32 mmol/L '23 26 20  ' Calcium 8.9 - 10.3 mg/dL 9.2 8.8(L) 9.0  Total Protein 6.5 - 8.1 g/dL 7.2 - 6.8  Total Bilirubin 0.3 - 1.2 mg/dL 0.5 - 0.8  Alkaline Phos 38 - 126 U/L 60 - 60  AST 15 - 41 U/L 12(L) - 13  ALT 0 - 44 U/L 15 - 12   Lipid Panel     Component Value Date/Time   CHOL 185 06/05/2019 1131   TRIG 63 06/05/2019 1131   HDL 58 06/05/2019 1131   CHOLHDL 3.2 06/05/2019 1131   LDLCALC 115 (H) 06/05/2019 1131    CBC    Component Value Date/Time   WBC 12.2 (H) 01/31/2021 0906   RBC 5.20 (H) 01/31/2021 0906   HGB 13.6 01/31/2021 0906   HGB 13.5 06/05/2019 1131   HCT 39.4 01/31/2021 0906   HCT 39.5 06/05/2019 1131   PLT 419 (H) 01/31/2021 0906   PLT 374 06/05/2019 1131   MCV 75.8 (L) 01/31/2021 0906   MCV 79 06/05/2019 1131   MCH 26.2 01/31/2021 0906   MCHC 34.5 01/31/2021 0906   RDW 14.9 01/31/2021 0906   RDW 15.0 06/05/2019 1131   LYMPHSABS 2.6 01/31/2021 0906   MONOABS 0.7 01/31/2021 0906   EOSABS 0.1 01/31/2021 0906   BASOSABS 0.0 01/31/2021 0906    ASSESSMENT AND PLAN: 1. Bronchitis with acute wheezing Patient started on prednisone  taper, Zithromax and Tessalon Perles.  Continue albuterol.  The lab had already closed for the afternoon so I requested that patient do a home COVID test.  Patient states she does have the kit at home.  She will let us  know if it is positive. Work note written keeping her out of work for 3 days. - predniSONE (DELTASONE) 20 MG tablet; Take 1.5 tab by mouth  daily x 2 days then 1 tab daily x 2 days then 1/2 tab daily x 2 days  Dispense: 7 tablet; Refill: 0 - azithromycin (ZITHROMAX Z-PAK) 250 MG tablet; Take 2 tablets by mouth day 1 then 1 tablet daily days 2-5  Dispense: 6 each; Refill: 0 - albuterol (VENTOLIN HFA) 108 (90 Base) MCG/ACT inhaler; Inhale 2 puffs into the lungs every 6 (six) hours as needed for wheezing or shortness of breath.  Dispense: 8.5 g; Refill: 1 - benzonatate (TESSALON) 100 MG capsule; Take 1 capsule (100 mg total) by mouth 2 (two) times daily as needed for cough.  Dispense: 20 capsule; Refill: 0  2. Elevated blood-pressure reading without diagnosis of hypertension DASH diet discussed and encouraged.    Patient was given the opportunity to ask questions.  Patient verbalized understanding of the plan and was able to repeat key elements of the plan.   Orders Placed This Encounter  Procedures   Novel Coronavirus, NAA ([facility])     Requested Prescriptions   Signed Prescriptions Disp Refills   predniSONE (DELTASONE) 20 MG tablet 7 tablet 0    Sig: Take 1.5 tab by mouth  daily x 2 days then 1 tab daily x 2 days then 1/2 tab daily x 2 days   azithromycin (ZITHROMAX Z-PAK) 250 MG tablet 6 each 0    Sig: Take 2 tablets by mouth day 1 then 1 tablet daily days 2-5   albuterol (VENTOLIN HFA) 108 (90 Base) MCG/ACT inhaler 8.5 g 1    Sig: Inhale 2 puffs into the lungs every 6 (six) hours as needed for wheezing or shortness of breath.   benzonatate (TESSALON) 100 MG capsule 20 capsule 0    Sig: Take 1 capsule (100 mg total) by mouth 2 (two) times daily as needed for cough.     No follow-ups on file.  Karle Plumber, MD, FACP

## 2021-04-29 NOTE — Telephone Encounter (Signed)
°  Chief Complaint: cough Symptoms: yellow phlegm, wheezing, SOB with activity Frequency: 4 days Pertinent Negatives: Patient denies chest pain, runny nose Disposition: [] ED /[] Urgent Care (no appt availability in office) / [x] Appointment(In office/virtual)/ []  [place] [facility]/ [] Home Care/ [] Refused Recommended Disposition /[] [place] [facility]/ []  Follow-up with PCP Additional Notes: In office appt this am.        Reason for Disposition  Wheezing is present  Answer Assessment - Initial Assessment Questions 1. ONSET: "When did the cough begin?"      4 days 2. SEVERITY: "How bad is the cough today?"      sporadic 3. SPUTUM: "Describe the color of your sputum" (none, dry cough; clear, white, yellow, green)     yellow 4. HEMOPTYSIS: "Are you coughing up any blood?" If so ask: "How much?" (flecks, streaks, tablespoons, etc.)     no 5. DIFFICULTY BREATHING: "Are you having difficulty breathing?" If Yes, ask: "How bad is it?" (e.g., mild, moderate, severe)    - MILD: No SOB at rest, mild SOB with walking, speaks normally in sentences, can lie down, no retractions, pulse < 100.    - MODERATE: SOB at rest, SOB with minimal exertion and prefers to sit, cannot lie down flat, speaks in phrases, mild retractions, audible wheezing, pulse 100-120.    - SEVERE: Very SOB at rest, speaks in single words, struggling to breathe, sitting hunched forward, retractions, pulse > 120      moderate 6. FEVER: "Do you have a fever?" If Yes, ask: "What is your temperature, how was it measured, and when did it start?"     no 7. CARDIAC HISTORY: "Do you have any history of heart disease?" (e.g., heart attack, congestive heart failure)      no 8. LUNG HISTORY: "Do you have any history of lung disease?"  (e.g., pulmonary embolus, asthma, emphysema)     smoker 9. PE RISK FACTORS: "Do you have a history of blood clots?" (or: recent major surgery, recent prolonged travel, bedridden)     no 10. OTHER  SYMPTOMS: "Do you have any other symptoms?" (e.g., runny nose, wheezing, chest pain)       wheezing 11. PREGNANCY: "Is there any chance you are pregnant?" "When was your last menstrual period?"       No LMP:now 12. TRAVEL: "Have you traveled out of the country in the last month?" (e.g., travel history, exposures)       no  Protocols used: Cough - Acute Productive-A-AH

## 2021-05-12 ENCOUNTER — Other Ambulatory Visit: Payer: Self-pay

## 2021-05-13 ENCOUNTER — Other Ambulatory Visit: Payer: Self-pay

## 2021-05-13 ENCOUNTER — Encounter (HOSPITAL_COMMUNITY): Payer: Self-pay | Admitting: Psychiatry

## 2021-05-13 ENCOUNTER — Telehealth (INDEPENDENT_AMBULATORY_CARE_PROVIDER_SITE_OTHER): Payer: No Payment, Other | Admitting: Psychiatry

## 2021-05-13 DIAGNOSIS — F4310 Post-traumatic stress disorder, unspecified: Secondary | ICD-10-CM | POA: Diagnosis not present

## 2021-05-13 DIAGNOSIS — F331 Major depressive disorder, recurrent, moderate: Secondary | ICD-10-CM | POA: Diagnosis not present

## 2021-05-13 MED ORDER — HYDROXYZINE HCL 10 MG PO TABS
10.0000 mg | ORAL_TABLET | Freq: Three times a day (TID) | ORAL | 2 refills | Status: DC | PRN
  Filled 2021-05-13 – 2021-12-10 (×2): qty 150, 50d supply, fill #0

## 2021-05-13 MED ORDER — ESCITALOPRAM OXALATE 10 MG PO TABS
10.0000 mg | ORAL_TABLET | Freq: Every day | ORAL | 2 refills | Status: DC
  Filled 2021-05-13 – 2021-12-10 (×2): qty 30, 30d supply, fill #0

## 2021-05-13 NOTE — Progress Notes (Signed)
[facility] NP Progress Note  Patient Identification: Judy Hanna MRN:  [id number] Date of Evaluation:  05/13/2021 Referral Source: therapist Chief Complaint:   Chief Complaint   Anxiety; Depression; Follow-up    Visit Diagnosis:    ICD-10-CM   1. Major depressive disorder, recurrent episode, moderate with anxious distress ([place])  F33.1     2. PTSD (post-traumatic stress disorder)  F43.10       History of Present Illness:   36 y/o female presents for follow up for depression and anxiety.  Depression fluctuates between mild to moderate depression, no suicidal ideations.  Mild anxiety, no panic attacks.  The medications are "working", denies side effects.  Sleep is "good" usually.  Appetite is better, "not doing the emotional eating" anymore.  Support system includes brother and sister. Denies any suicidal or homicidal ideation today and audio or visual hallucinations.   Family history: Brother: Bipolar, Schizophrenia, Depression, Anxiety Father: Mood issues was going through therapy he was not diagnosed with anything  Past Psychiatric History: PTSD, depression,anxiety  Past Medical History:  Past Medical History:  Diagnosis Date   BMI 50.0-59.9, adult ([place]) 06/26/2020   Fibroids    No past surgical history on file.  Family Psychiatric History: brother with schizoaffective disorder and father with mental health issues, depression and anxiety  Family History:  Family History  Problem Relation Age of Onset   Hypertension Mother    Hypertension Sister    Hypertension Maternal Grandmother     Social History:   Social History   Socioeconomic History   Marital status: Single    Spouse name: Not on file   Number of children: 0   Years of education: Not on file   Highest education level: Some college, no degree  Occupational History   Occupation: home health care  Tobacco Use   Smoking status: Every Day    Packs/day: 1.00    Years: 3.00    Pack years: 3.00    Types:  Cigarettes   Smokeless tobacco: Never  Vaping Use   Vaping Use: Never used  Substance and Sexual Activity   Alcohol use: Yes    Comment: occisonally    Drug use: Not Currently    Types: Marijuana   Sexual activity: Yes    Partners: Female  Other Topics Concern   Not on file  Social History Narrative   ** Merged History Encounter **       Social Determinants of Health   Financial Resource Strain: Not on file  Food Insecurity: Not on file  Transportation Needs: Not on file  Physical Activity: Not on file  Stress: Not on file  Social Connections: Not on file    Additional Social History: moved from [place]:  No Known Allergies  Metabolic Disorder Labs: Lab Results  Component Value Date   HGBA1C 5.6 06/05/2019   No results found for: PROLACTIN Lab Results  Component Value Date   CHOL 185 06/05/2019   TRIG 63 06/05/2019   HDL 58 06/05/2019   CHOLHDL 3.2 06/05/2019   Hall 115 (H) 06/05/2019   Lab Results  Component Value Date   TSH 2.317 11/10/2018    Therapeutic Level Labs: No results found for: LITHIUM No results found for: CBMZ No results found for: VALPROATE  Current Medications: Current Outpatient Medications  Medication Sig Dispense Refill   acetaminophen (TYLENOL) 500 MG tablet Take 1,000 mg by mouth every 6 (six) hours as needed for fever or headache.     albuterol (  VENTOLIN HFA) 108 (90 Base) MCG/ACT inhaler Inhale 2 puffs into the lungs every 6 (six) hours as needed for wheezing or shortness of breath. 8.5 g 1   azithromycin (ZITHROMAX Z-PAK) 250 MG tablet Take 2 tablets by mouth on day 1 then 1 tablet daily days 2-5 6 each 0   benzonatate (TESSALON) 100 MG capsule Take 1 capsule (100 mg total) by mouth 2 (two) times daily as needed for cough. 20 capsule 0   cyclobenzaprine (FLEXERIL) 10 MG tablet Take 1 tablet (10 mg total) by mouth at bedtime as needed for muscle spasms. 10 tablet 0   escitalopram (LEXAPRO) 10 MG tablet Take 1 tablet (10  mg total) by mouth daily. 30 tablet 2   hydrOXYzine (ATARAX) 10 MG tablet Take 1 tablet (10 mg total) by mouth 3 (three) times daily as needed (may take one tablet as needed twice daily for anxiety, and 3 tablets at bedtime for sleep as needed). 150 tablet 2   naproxen (NAPROSYN) 500 MG tablet Take 1 tablet (500 mg total) by mouth 2 (two) times daily. 30 tablet 0   nicotine (NICODERM CQ - DOSED IN MG/24 HOURS) 21 mg/24hr patch Place 1 patch (21 mg total) onto the skin daily. 28 patch 1   nicotine polacrilex (NICORETTE) 2 MG gum Take 1 each (2 mg total) by mouth as needed for smoking cessation. 100 tablet 0   omeprazole (PRILOSEC) 20 MG capsule Take 1 capsule (20 mg total) by mouth 2 (two) times daily before a meal. 60 capsule 0   predniSONE (DELTASONE) 20 MG tablet Take 1.5 tab by mouth  daily x 2 days then 1 tab daily x 2 days then 1/2 tab daily x 2 days 7 tablet 0   SUMAtriptan (IMITREX) 50 MG tablet Take 1 tablet at the start of the headache.  May repeat in 2 hours if headache does not resolve.  Max 2 hours / 24-hour. 10 tablet 1   No current facility-administered medications for this visit.    Musculoskeletal: Strength & Muscle Tone:  UTA, telephone visit, denies issues Gait & Station:  denies issues Patient leans:  denies  Psychiatric Specialty Exam: Review of Systems  Psychiatric/Behavioral:  Positive for dysphoric mood. The patient is nervous/anxious.   All other systems reviewed and are negative.  There were no vitals taken for this visit.There is no height or weight on file to calculate BMI.  General Appearance: UTA, telephone visit  Eye Contact:  UTA  Speech:  Clear and Coherent  Volume:  Normal  Mood:  Anxious and Depressed  Affect:  UTA  Thought Process:  Coherent  Orientation:  Full (Time, Place, and Person)  Thought Content:  Rumination  Suicidal Thoughts:  No  Homicidal Thoughts:  No  Memory:  Immediate;   Good Recent;   Good Remote;   Good  Judgement:  Good   Insight:  Good  Psychomotor Activity:  Normal  Concentration:  Concentration: Good and Attention Span: Good  Recall:  Good  Fund of Knowledge:Good  Language: Good  Akathisia:  No  Handed:  Right  AIMS (if indicated):  not done  Assets:  Housing Leisure Time Physical Health Resilience Social Support  ADL's:  Intact  Cognition: WNL  Sleep:  Fair   Screenings: GAD-7    Personnel officer Visit from 04/29/2021 in [place] Counselor from 03/04/2021 in [facility] Office Visit from 02/10/2021 in [place] Office Visit  from 01/28/2021 in [place] Office Visit from 12/23/2020 in [place] for [employer] at [facility]  Total GAD-7 Score 14 13 7 5 6       PHQ2-9    [place] Office Visit from 04/29/2021 in [place] Office Visit from 03/10/2021 in [facility] Counselor from 03/04/2021 in [facility] Office Visit from 02/10/2021 in [place] Office Visit from 01/28/2021 in [place]  PHQ-2 Total Score 4 2 6 2 4   PHQ-9 Total Score 14 6 21 5 7       [place] Office Visit from 03/10/2021 in [facility] Counselor from 03/04/2021 in [facility] ED from 01/31/2021 in [place] DEPT  C-SSRS RISK CATEGORY No Risk No Risk No Risk       Assessment and Plan:  Major depressive disorder, recurrent, moderate: -Continue Lexapro 10 mg daily  General anxiety disorder: -Continue hydroxyzine 10 mg TID PRN   Insomnia: -Continue 30 mg of hydroxyzine daily at bedtime as needed  Virtual Visit via Telephone Note  I connected with [name] on 05/13/21 at  2:00 PM EST by telephone and verified that I am  speaking with the correct person using two identifiers.  Location: Patient: home Provider: work office   I discussed the limitations, risks, security and privacy concerns of performing an evaluation and management service by telephone and the availability of in person appointments. I also discussed with the patient that there may be a patient responsible charge related to this service. The patient expressed understanding and agreed to proceed.  Follow Up Instructions: Follow up in 3 months   I discussed the assessment and treatment plan with the patient. The patient was provided an opportunity to ask questions and all were answered. The patient agreed with the plan and demonstrated an understanding of the instructions.   The patient was advised to call back or seek an in-person evaluation if the symptoms worsen or if the condition fails to improve as anticipated.  I provided 10 minutes of non-face-to-face time during this encounter.   Waylan Boga, NP    Waylan Boga, NP 1/17/20232:06 PM

## 2021-05-19 ENCOUNTER — Encounter: Payer: Self-pay | Admitting: Obstetrics and Gynecology

## 2021-05-19 ENCOUNTER — Other Ambulatory Visit: Payer: Self-pay

## 2021-05-19 ENCOUNTER — Ambulatory Visit (INDEPENDENT_AMBULATORY_CARE_PROVIDER_SITE_OTHER): Payer: Self-pay | Admitting: Obstetrics and Gynecology

## 2021-05-19 VITALS — BP 142/90 | HR 84 | Ht 69.0 in | Wt 383.6 lb

## 2021-05-19 DIAGNOSIS — D219 Benign neoplasm of connective and other soft tissue, unspecified: Secondary | ICD-10-CM

## 2021-05-19 DIAGNOSIS — Z23 Encounter for immunization: Secondary | ICD-10-CM

## 2021-05-19 NOTE — H&P (View-Only) (Signed)
OB/GYN Pre-Op History and Physical  Judy Hanna is a 36 y.o. G3P0030 presenting for preoperative appointment for uterine myomectomy.  Pt has two dominant fibroids and due to their size and position they are not amenable to [facility] procedure. Pt desires to retain the potential for childbearing and does not wish to have a hysterectomy.      Past Medical History:  Diagnosis Date   BMI 50.0-59.9, adult ([place]) 06/26/2020   Fibroids     No past surgical history on file.  OB History  Gravida Para Term Preterm AB Living  3 0 0 0 3    SAB IAB Ectopic Multiple Live Births  0 3 0        # Outcome Date GA Lbr Len/2nd Weight Sex Delivery Anes PTL Lv  3 IAB           2 IAB           1 IAB             Social History   Socioeconomic History   Marital status: Single    Spouse name: Not on file   Number of children: 0   Years of education: Not on file   Highest education level: Some college, no degree  Occupational History   Occupation: home health care  Tobacco Use   Smoking status: Every Day    Packs/day: 1.00    Years: 3.00    Pack years: 3.00    Types: Cigarettes   Smokeless tobacco: Never  Vaping Use   Vaping Use: Never used  Substance and Sexual Activity   Alcohol use: Yes    Comment: occisonally    Drug use: Not Currently    Types: Marijuana   Sexual activity: Yes    Partners: Female  Other Topics Concern   Not on file  Social History Narrative   ** Merged History Encounter **       Social Determinants of Health   Financial Resource Strain: Not on file  Food Insecurity: Not on file  Transportation Needs: Not on file  Physical Activity: Not on file  Stress: Not on file  Social Connections: Not on file    Family History  Problem Relation Age of Onset   Hypertension Mother    Hypertension Sister    Hypertension Maternal Grandmother     (Not in a hospital admission)   No Known Allergies  Review of Systems: Negative except for what is mentioned in  HPI.     Physical Exam: BP (!) 142/90    Pulse 84    Ht 5\' 9"  (1.753 m)    Wt (!) 383 lb 9.6 oz (174 kg)    LMP 05/01/2021 (Approximate)    BMI 56.65 kg/m  CONSTITUTIONAL: Well-developed, well-nourished, obese female in no acute distress.  HENT:  Normocephalic, atraumatic, External right and left ear normal. Oropharynx is clear and moist EYES: Conjunctivae and EOM are normal.  NECK: Normal range of motion, supple, no masses SKIN: Skin is warm and dry. No rash noted. Not diaphoretic. No erythema. No pallor. [place]: Alert and oriented to person, place, and time. Normal reflexes, muscle tone coordination. No cranial nerve deficit noted. PSYCHIATRIC: Normal mood and affect. Normal behavior. Normal judgment and thought content. CARDIOVASCULAR: Normal heart rate noted, regular rhythm RESPIRATORY: Effort and breath sounds normal, no problems with respiration noted ABDOMEN: Soft, obese, nontender, nondistended, PELVIC: bimanual exam showed 14-16 week sized uterus, mobile MUSCULOSKELETAL: Normal range of motion. No edema and no tenderness.  2+ distal pulses.   Pertinent Labs/Studies:    CLINICAL DATA:  Left lower quadrant pain.   EXAM: CT ABDOMEN AND PELVIS WITHOUT CONTRAST   TECHNIQUE: Multidetector CT imaging of the abdomen and pelvis was performed following the standard protocol without IV contrast.   COMPARISON:  Pelvic ultrasound, dated April 04, 2020   FINDINGS: Lower chest: No acute abnormality.   Hepatobiliary: No focal liver abnormality is seen. No gallstones, gallbladder wall thickening, or biliary dilatation.   Pancreas: Unremarkable. No pancreatic ductal dilatation or surrounding inflammatory changes.   Spleen: Normal in size without focal abnormality.   Adrenals/Urinary Tract: Adrenal glands are unremarkable. Kidneys are normal, without renal calculi, focal lesion, or hydronephrosis. Bladder is unremarkable.   Stomach/Bowel: Stomach is within normal limits.  Appendix appears normal. No evidence of bowel wall thickening, distention, or inflammatory changes.   Vascular/Lymphatic: No significant vascular findings are present. No enlarged abdominal or pelvic lymph nodes.   Reproductive: A 9.2 cm x 7.6 cm isodense mass is seen within the uterine fundus. A similar appearing 9.3 cm x 7.4 cm isodense mass is noted within the expected region of the lower uterine segment. A 2.6 cm x 2.6 cm well-defined area of low attenuation (approximately 11.16 Hounsfield units) is seen along the superior aspect of the left adnexa (axial CT image 72, CT series number 3/coronal reformatted image 50, CT series number 6).   Other: A 2.7 cm x 2.9 cm fat containing umbilical hernia is seen. No abdominopelvic ascites.   Musculoskeletal: Moderate severity degenerative changes seen at the level of L5-S1.   IMPRESSION: 1. Findings likely consistent with large noncalcified uterine fibroids. This corresponds to the findings seen on the prior pelvic ultrasound. 2. Additional findings along the left adnexa which may correspond to the left ovarian cyst seen on the prior pelvic ultrasound. No follow-up imaging recommended. Note: This recommendation does not apply to premenarchal patients and to those with increased risk (genetic, family history, elevated tumor markers or other high-risk factors) of ovarian cancer. Reference: JACR 2020 Feb; 17(2):248-254 3. Degenerative changes at the level of L5-S1.     CLINICAL DATA:  Initial evaluation for acute left lower quadrant pain.   EXAM: TRANSABDOMINAL AND TRANSVAGINAL ULTRASOUND OF PELVIS   DOPPLER ULTRASOUND OF OVARIES   TECHNIQUE: Both transabdominal and transvaginal ultrasound examinations of the pelvis were performed. Transabdominal technique was performed for global imaging of the pelvis including uterus, ovaries, adnexal regions, and pelvic cul-de-sac.   It was necessary to proceed with endovaginal exam  following the transabdominal exam to visualize the uterus, endometrium, and ovaries. Color and duplex Doppler ultrasound was utilized to evaluate blood flow to the ovaries.   COMPARISON:  None available.   FINDINGS: Uterus   Measurements: 14.9 x 7.4 x 9.1 cm = volume: 524.2 mL. 8.8 x 6.5 x 8.5 cm exophytic fibroid extends from the right uterine fundus. Additional large fibroid centered at the left uterine body measures 7.2 x 6.7 x 7.7 cm   Endometrium   Thickness: 8.9 mm. Endometrial stripe somewhat difficult to visualize due to overlying fibroids. No definite focal abnormality.   Right ovary   Measurements: 3.1 x 1.9 x 2.9 cm = volume: 8.5 mL. Normal appearance/no adnexal mass.   Left ovary   Measurements: 3.2 x 3.0 x 3.2 cm = volume: 50.9 mL. 2.0 x 1.2 x 1.8 cm simple cyst, most consistent with a normal physiologic follicular cyst/dominant follicle.   Pulsed Doppler evaluation of both ovaries demonstrates normal low-resistance arterial and  venous waveforms.   Other findings   No abnormal free fluid.   IMPRESSION: 1. Enlarged fibroid uterus as detailed above. 2. 2 cm simple left ovarian cyst, most consistent with a normal physiologic follicular cyst/dominant follicle. No followup imaging recommended. note: This recommendation does not apply to premenarchal patients or to those with increased risk (genetic, family history, elevated tumor markers or other high-risk factors) of ovarian cancer. Reference: Radiology 2019 Nov; 293(2):359-371. 3. No evidence for ovarian torsion or other acute abnormality      Assessment and Plan :Judy Hanna is a 36 y.o. G3P0030 here for preoperative visit for myomectomy.  Risks and benefits of the procedure have been given including bleeding, infection, involvement of other organs including bladder and bowel. Pt also aware if there is uncontrolled bleeding or other complication, she may be converted to hysterectomy.  Depending on depth  of dissection, pt may also need cesarean sections for all future deliveries.   .   Plan for uterine myomectomy NPO Admission labs ordered Risks as above.   Lynnda Shields, M.D. Attending [place], [facility] for [employer], [place]

## 2021-05-19 NOTE — Progress Notes (Signed)
OB/GYN Pre-Op History and Physical  Judy Hanna is a 36 y.o. G3P0030 presenting for preoperative appointment for uterine myomectomy.  Pt has two dominant fibroids and due to their size and position they are not amenable to [facility] procedure. Pt desires to retain the potential for childbearing and does not wish to have a hysterectomy.      Past Medical History:  Diagnosis Date   BMI 50.0-59.9, adult ([place]) 06/26/2020   Fibroids     No past surgical history on file.  OB History  Gravida Para Term Preterm AB Living  3 0 0 0 3    SAB IAB Ectopic Multiple Live Births  0 3 0        # Outcome Date GA Lbr Len/2nd Weight Sex Delivery Anes PTL Lv  3 IAB           2 IAB           1 IAB             Social History   Socioeconomic History   Marital status: Single    Spouse name: Not on file   Number of children: 0   Years of education: Not on file   Highest education level: Some college, no degree  Occupational History   Occupation: home health care  Tobacco Use   Smoking status: Every Day    Packs/day: 1.00    Years: 3.00    Pack years: 3.00    Types: Cigarettes   Smokeless tobacco: Never  Vaping Use   Vaping Use: Never used  Substance and Sexual Activity   Alcohol use: Yes    Comment: occisonally    Drug use: Not Currently    Types: Marijuana   Sexual activity: Yes    Partners: Female  Other Topics Concern   Not on file  Social History Narrative   ** Merged History Encounter **       Social Determinants of Health   Financial Resource Strain: Not on file  Food Insecurity: Not on file  Transportation Needs: Not on file  Physical Activity: Not on file  Stress: Not on file  Social Connections: Not on file    Family History  Problem Relation Age of Onset   Hypertension Mother    Hypertension Sister    Hypertension Maternal Grandmother     (Not in a hospital admission)   No Known Allergies  Review of Systems: Negative except for what is mentioned in  HPI.     Physical Exam: BP (!) 142/90    Pulse 84    Ht 5\' 9"  (1.753 m)    Wt (!) 383 lb 9.6 oz (174 kg)    LMP 05/01/2021 (Approximate)    BMI 56.65 kg/m  CONSTITUTIONAL: Well-developed, well-nourished, obese female in no acute distress.  HENT:  Normocephalic, atraumatic, External right and left ear normal. Oropharynx is clear and moist EYES: Conjunctivae and EOM are normal.  NECK: Normal range of motion, supple, no masses SKIN: Skin is warm and dry. No rash noted. Not diaphoretic. No erythema. No pallor. [place]: Alert and oriented to person, place, and time. Normal reflexes, muscle tone coordination. No cranial nerve deficit noted. PSYCHIATRIC: Normal mood and affect. Normal behavior. Normal judgment and thought content. CARDIOVASCULAR: Normal heart rate noted, regular rhythm RESPIRATORY: Effort and breath sounds normal, no problems with respiration noted ABDOMEN: Soft, obese, nontender, nondistended, PELVIC: bimanual exam showed 14-16 week sized uterus, mobile MUSCULOSKELETAL: Normal range of motion. No edema and no tenderness.  2+ distal pulses.   Pertinent Labs/Studies:    CLINICAL DATA:  Left lower quadrant pain.   EXAM: CT ABDOMEN AND PELVIS WITHOUT CONTRAST   TECHNIQUE: Multidetector CT imaging of the abdomen and pelvis was performed following the standard protocol without IV contrast.   COMPARISON:  Pelvic ultrasound, dated April 04, 2020   FINDINGS: Lower chest: No acute abnormality.   Hepatobiliary: No focal liver abnormality is seen. No gallstones, gallbladder wall thickening, or biliary dilatation.   Pancreas: Unremarkable. No pancreatic ductal dilatation or surrounding inflammatory changes.   Spleen: Normal in size without focal abnormality.   Adrenals/Urinary Tract: Adrenal glands are unremarkable. Kidneys are normal, without renal calculi, focal lesion, or hydronephrosis. Bladder is unremarkable.   Stomach/Bowel: Stomach is within normal limits.  Appendix appears normal. No evidence of bowel wall thickening, distention, or inflammatory changes.   Vascular/Lymphatic: No significant vascular findings are present. No enlarged abdominal or pelvic lymph nodes.   Reproductive: A 9.2 cm x 7.6 cm isodense mass is seen within the uterine fundus. A similar appearing 9.3 cm x 7.4 cm isodense mass is noted within the expected region of the lower uterine segment. A 2.6 cm x 2.6 cm well-defined area of low attenuation (approximately 11.16 Hounsfield units) is seen along the superior aspect of the left adnexa (axial CT image 72, CT series number 3/coronal reformatted image 50, CT series number 6).   Other: A 2.7 cm x 2.9 cm fat containing umbilical hernia is seen. No abdominopelvic ascites.   Musculoskeletal: Moderate severity degenerative changes seen at the level of L5-S1.   IMPRESSION: 1. Findings likely consistent with large noncalcified uterine fibroids. This corresponds to the findings seen on the prior pelvic ultrasound. 2. Additional findings along the left adnexa which may correspond to the left ovarian cyst seen on the prior pelvic ultrasound. No follow-up imaging recommended. Note: This recommendation does not apply to premenarchal patients and to those with increased risk (genetic, family history, elevated tumor markers or other high-risk factors) of ovarian cancer. Reference: JACR 2020 Feb; 17(2):248-254 3. Degenerative changes at the level of L5-S1.     CLINICAL DATA:  Initial evaluation for acute left lower quadrant pain.   EXAM: TRANSABDOMINAL AND TRANSVAGINAL ULTRASOUND OF PELVIS   DOPPLER ULTRASOUND OF OVARIES   TECHNIQUE: Both transabdominal and transvaginal ultrasound examinations of the pelvis were performed. Transabdominal technique was performed for global imaging of the pelvis including uterus, ovaries, adnexal regions, and pelvic cul-de-sac.   It was necessary to proceed with endovaginal exam  following the transabdominal exam to visualize the uterus, endometrium, and ovaries. Color and duplex Doppler ultrasound was utilized to evaluate blood flow to the ovaries.   COMPARISON:  None available.   FINDINGS: Uterus   Measurements: 14.9 x 7.4 x 9.1 cm = volume: 524.2 mL. 8.8 x 6.5 x 8.5 cm exophytic fibroid extends from the right uterine fundus. Additional large fibroid centered at the left uterine body measures 7.2 x 6.7 x 7.7 cm   Endometrium   Thickness: 8.9 mm. Endometrial stripe somewhat difficult to visualize due to overlying fibroids. No definite focal abnormality.   Right ovary   Measurements: 3.1 x 1.9 x 2.9 cm = volume: 8.5 mL. Normal appearance/no adnexal mass.   Left ovary   Measurements: 3.2 x 3.0 x 3.2 cm = volume: 50.9 mL. 2.0 x 1.2 x 1.8 cm simple cyst, most consistent with a normal physiologic follicular cyst/dominant follicle.   Pulsed Doppler evaluation of both ovaries demonstrates normal low-resistance arterial and  venous waveforms.   Other findings   No abnormal free fluid.   IMPRESSION: 1. Enlarged fibroid uterus as detailed above. 2. 2 cm simple left ovarian cyst, most consistent with a normal physiologic follicular cyst/dominant follicle. No followup imaging recommended. note: This recommendation does not apply to premenarchal patients or to those with increased risk (genetic, family history, elevated tumor markers or other high-risk factors) of ovarian cancer. Reference: Radiology 2019 Nov; 293(2):359-371. 3. No evidence for ovarian torsion or other acute abnormality      Assessment and Plan :Judy Hanna is a 36 y.o. G3P0030 here for preoperative visit for myomectomy.  Risks and benefits of the procedure have been given including bleeding, infection, involvement of other organs including bladder and bowel. Pt also aware if there is uncontrolled bleeding or other complication, she may be converted to hysterectomy.  Depending on depth  of dissection, pt may also need cesarean sections for all future deliveries.   .   Plan for uterine myomectomy NPO Admission labs ordered Risks as above.   Lynnda Shields, M.D. Attending [place], [facility] for [employer], [place]

## 2021-06-03 ENCOUNTER — Ambulatory Visit (INDEPENDENT_AMBULATORY_CARE_PROVIDER_SITE_OTHER): Payer: No Payment, Other | Admitting: Clinical

## 2021-06-03 DIAGNOSIS — F331 Major depressive disorder, recurrent, moderate: Secondary | ICD-10-CM | POA: Diagnosis not present
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
## 2021-06-10 NOTE — Pre-Procedure Instructions (Signed)
Surgical Instructions    Your procedure is scheduled on Wednesday 06/18/21.   Report to [facility] Main Entrance "A" at 05:30 A.M., then check in with the Admitting office.  Call this number if you have problems the morning of surgery:  [phone]   If you have any questions prior to your surgery date call [phone]: Open Monday-Friday 8am-4pm    Remember:  Do not eat or drink after midnight the night before your surgery    Take these medicines the morning of surgery with A SIP OF WATER:   escitalopram (LEXAPRO)  omeprazole (PRILOSEC)    Take these medicines if needed:    acetaminophen (TYLENOL)   albuterol (VENTOLIN HFA) 108 (90 Base)   benzonatate (TESSALON)   SUMAtriptan (IMITREX)  As of today, STOP taking any Aspirin (unless otherwise instructed by your surgeon) Aleve, Naproxen, Ibuprofen, Motrin, Advil, Goody's, BC's, all herbal medications, fish oil, and all vitamins.           Do not wear jewelry or makeup Do not wear lotions, powders, perfumes/colognes, or deodorant. Do not shave 48 hours prior to surgery.  Men may shave face and neck. Do not bring valuables to the hospital. Do not wear nail polish, gel polish, artificial nails, or any other type of covering on natural nails (fingers and toes) If you have artificial nails or gel coating that need to be removed by a nail salon, please have this removed prior to surgery. Artificial nails or gel coating may interfere with anesthesia's ability to adequately monitor your vital signs.  [facility] is not responsible for any belongings or valuables. .   Do NOT Smoke (Tobacco/Vaping)  24 hours prior to your procedure  If you use a CPAP at night, you may bring your mask for your overnight stay.   Contacts, glasses, hearing aids, dentures or partials may not be worn into surgery, please bring cases for these belongings   For patients admitted to the hospital, discharge time will be determined by your treatment team.    Patients discharged the day of surgery will not be allowed to drive home, and someone needs to stay with them for 24 hours.  NO VISITORS WILL BE ALLOWED IN PRE-OP WHERE PATIENTS ARE PREPPED FOR SURGERY.  ONLY 1 SUPPORT PERSON MAY BE PRESENT IN THE WAITING ROOM WHILE YOU ARE IN SURGERY.  IF YOU ARE TO BE ADMITTED, ONCE YOU ARE IN YOUR ROOM YOU WILL BE ALLOWED TWO (2) VISITORS. 1 (ONE) VISITOR MAY STAY OVERNIGHT BUT MUST ARRIVE TO THE ROOM BY 8pm.  Minor children may have two parents present. Special consideration for safety and communication needs will be reviewed on a case by case basis.  Special instructions:    Oral Hygiene is also important to reduce your risk of infection.  Remember - BRUSH YOUR TEETH THE MORNING OF SURGERY WITH YOUR REGULAR TOOTHPASTE   [facility]- Preparing For Surgery  Before surgery, you can play an important role. Because skin is not sterile, your skin needs to be as free of germs as possible. You can reduce the number of germs on your skin by washing with CHG (chlorahexidine gluconate) Soap before surgery.  CHG is an antiseptic cleaner which kills germs and bonds with the skin to continue killing germs even after washing.     Please do not use if you have an allergy to CHG or antibacterial soaps. If your skin becomes reddened/irritated stop using the CHG.  Do not shave (including legs and underarms) for  at least 48 hours prior to first CHG shower. It is OK to shave your face.  Please follow these instructions carefully.     Shower the NIGHT BEFORE SURGERY and the MORNING OF SURGERY with CHG Soap.   If you chose to wash your hair, wash your hair first as usual with your normal shampoo. After you shampoo, rinse your hair and body thoroughly to remove the shampoo.  Then [employer] and genitals (private parts) with your normal soap and rinse thoroughly to remove soap.  After that Use CHG Soap as you would any other liquid soap. You can apply CHG directly to the skin  and wash gently with a scrungie or a clean washcloth.   Apply the CHG Soap to your body ONLY FROM THE NECK DOWN.  Do not use on open wounds or open sores. Avoid contact with your eyes, ears, mouth and genitals (private parts). Wash Face and genitals (private parts)  with your normal soap.   Wash thoroughly, paying special attention to the area where your surgery will be performed.  Thoroughly rinse your body with warm water from the neck down.  DO NOT shower/wash with your normal soap after using and rinsing off the CHG Soap.  Pat yourself dry with a CLEAN TOWEL.  Wear CLEAN PAJAMAS to bed the night before surgery  Place CLEAN SHEETS on your bed the night before your surgery  DO NOT SLEEP WITH PETS.   Day of Surgery:  Take a shower with CHG soap. Wear Clean/Comfortable clothing the morning of surgery Do not apply any deodorants/lotions.   Remember to brush your teeth WITH YOUR REGULAR TOOTHPASTE.    COVID testing  If you are going to stay overnight or be admitted after your procedure/surgery and require a pre-op COVID test, please follow these instructions after your COVID test   You are not required to quarantine however you are required to wear a well-fitting mask when you are out and around people not in your household.  If your mask becomes wet or soiled, replace with a new one.  Wash your hands often with soap and water for 20 seconds or clean your hands with an alcohol-based hand sanitizer that contains at least 60% alcohol.  Do not share personal items.  Notify your provider: if you are in close contact with someone who has COVID  or if you develop a fever of 100.4 or greater, sneezing, cough, sore throat, shortness of breath or body aches.    Please read over the following fact sheets that you were given.

## 2021-06-11 ENCOUNTER — Encounter: Payer: Self-pay | Admitting: Obstetrics and Gynecology

## 2021-06-11 ENCOUNTER — Other Ambulatory Visit: Payer: Self-pay

## 2021-06-11 ENCOUNTER — Encounter (HOSPITAL_COMMUNITY)
Admission: RE | Admit: 2021-06-11 | Discharge: 2021-06-11 | Disposition: A | Discharge status: Home / Self Care | Payer: Self-pay | Source: Ambulatory Visit | Attending: Obstetrics and Gynecology | Admitting: Obstetrics and Gynecology

## 2021-06-11 ENCOUNTER — Encounter (HOSPITAL_COMMUNITY): Payer: Self-pay

## 2021-06-11 VITALS — BP 101/84 | HR 88 | Temp 98.7°F | Resp 18 | Ht 69.0 in | Wt 386.1 lb

## 2021-06-11 DIAGNOSIS — Z01812 Encounter for preprocedural laboratory examination: Secondary | ICD-10-CM | POA: Insufficient documentation

## 2021-06-11 DIAGNOSIS — Z01818 Encounter for other preprocedural examination: Secondary | ICD-10-CM

## 2021-06-11 DIAGNOSIS — D219 Benign neoplasm of connective and other soft tissue, unspecified: Secondary | ICD-10-CM | POA: Insufficient documentation

## 2021-06-11 LAB — COMPREHENSIVE METABOLIC PANEL WITH GFR
ALT: 17 U/L (ref 0–44)
AST: 17 U/L (ref 15–41)
Albumin: 3.5 g/dL (ref 3.5–5.0)
Alkaline Phosphatase: 56 U/L (ref 38–126)
Anion gap: 7 (ref 5–15)
BUN: 9 mg/dL (ref 6–20)
CO2: 28 mmol/L (ref 22–32)
Calcium: 9.1 mg/dL (ref 8.9–10.3)
Chloride: 101 mmol/L (ref 98–111)
Creatinine, Ser: 0.8 mg/dL (ref 0.44–1.00)
GFR, Estimated: >60 mL/min
Glucose, Bld: 102 mg/dL — ABNORMAL HIGH (ref 70–99)
Potassium: 3.8 mmol/L (ref 3.5–5.1)
Sodium: 136 mmol/L (ref 135–145)
Total Bilirubin: 0.5 mg/dL (ref 0.3–1.2)
Total Protein: 6.7 g/dL (ref 6.5–8.1)

## 2021-06-11 LAB — CBC
HCT: 41.1 % (ref 36.0–46.0)
Hemoglobin: 14.2 g/dL (ref 12.0–15.0)
MCH: 26.3 pg (ref 26.0–34.0)
MCHC: 34.5 g/dL (ref 30.0–36.0)
MCV: 76.3 fL — ABNORMAL LOW (ref 80.0–100.0)
Platelets: 389 K/uL (ref 150–400)
RBC: 5.39 MIL/uL — ABNORMAL HIGH (ref 3.87–5.11)
RDW: 14.9 % (ref 11.5–15.5)
WBC: 9.4 K/uL (ref 4.0–10.5)
nRBC: 0 % (ref 0.0–0.2)

## 2021-06-11 NOTE — Progress Notes (Signed)
PCP - Kristen Loader Cardiologist - none  PPM/ICD - denies Device Orders -  Rep Notified -   Chest x-ray - none EKG -none  Stress Test - none ECHO - none Cardiac Cath - none  Sleep Study - no CPAP -   Fasting Blood Sugar - n/a Checks Blood Sugar _____ times a day  Blood Thinner Instructions:n/a  Aspirin Instructions:n/a  ERAS Protcol -no  PRE-SURGERY Ensure or G2-   COVID TEST- no Covid test performed;pt had +Covid home test on 03/29/21.   Anesthesia review: no  Patient denies shortness of breath, fever, cough and chest pain at PAT appointment   All instructions explained to the patient, with a verbal understanding of the material. Patient agrees to go over the instructions while at home for a better understanding. Patient also instructed to wear a mask while out in public before her surgery . The opportunity to ask questions was provided.

## 2021-06-12 ENCOUNTER — Telehealth: Payer: Self-pay

## 2021-06-12 NOTE — Telephone Encounter (Signed)
Blood bank at  called and left voicemail stating they had a question for Dr. Elgie Congo regarding patient's upcoming procedure. I called blood bank back and blood bank explained patient is scheduled for a procedure with Dr. Elgie Congo on 06/18/21. Per blood bank patient has a positive antibody screen and their policy is to cross match 2 units of blood. Blood bank wanted to verify if 2 units of blood was sufficient or if Dr. Elgie Congo wanted more. I sent Dr. Elgie Congo an in-basket message to verify. Per Dr. Elgie Congo he would like 4 units of blood cross matched. I called blood bank and reviewed this with them. Blood bank verbalized understanding to cross match 4 units of blood.   Paulina Fusi, RN 06/12/21

## 2021-06-18 ENCOUNTER — Inpatient Hospital Stay (HOSPITAL_COMMUNITY): Payer: Self-pay | Admitting: Anesthesiology

## 2021-06-18 ENCOUNTER — Encounter (HOSPITAL_COMMUNITY)
Admission: RE | Disposition: A | Discharge status: Home / Self Care | Payer: Self-pay | Source: Home / Self Care | Attending: Obstetrics and Gynecology

## 2021-06-18 ENCOUNTER — Inpatient Hospital Stay (HOSPITAL_COMMUNITY)
Admission: RE | Admit: 2021-06-18 | Discharge: 2021-06-20 | DRG: 742 | Disposition: A | Discharge status: Home / Self Care | Payer: Self-pay | Attending: Obstetrics and Gynecology | Admitting: Obstetrics and Gynecology

## 2021-06-18 ENCOUNTER — Encounter (HOSPITAL_COMMUNITY): Payer: Self-pay | Admitting: Obstetrics and Gynecology

## 2021-06-18 ENCOUNTER — Other Ambulatory Visit: Payer: Self-pay

## 2021-06-18 DIAGNOSIS — Z9889 Other specified postprocedural states: Secondary | ICD-10-CM

## 2021-06-18 DIAGNOSIS — D259 Leiomyoma of uterus, unspecified: Secondary | ICD-10-CM

## 2021-06-18 DIAGNOSIS — D219 Benign neoplasm of connective and other soft tissue, unspecified: Secondary | ICD-10-CM

## 2021-06-18 DIAGNOSIS — F418 Other specified anxiety disorders: Secondary | ICD-10-CM

## 2021-06-18 DIAGNOSIS — R1032 Left lower quadrant pain: Secondary | ICD-10-CM | POA: Diagnosis present

## 2021-06-18 DIAGNOSIS — Z6841 Body Mass Index (BMI) 40.0 and over, adult: Secondary | ICD-10-CM

## 2021-06-18 DIAGNOSIS — K219 Gastro-esophageal reflux disease without esophagitis: Secondary | ICD-10-CM

## 2021-06-18 DIAGNOSIS — I10 Essential (primary) hypertension: Secondary | ICD-10-CM

## 2021-06-18 DIAGNOSIS — F17210 Nicotine dependence, cigarettes, uncomplicated: Secondary | ICD-10-CM | POA: Diagnosis present

## 2021-06-18 DIAGNOSIS — N8302 Follicular cyst of left ovary: Secondary | ICD-10-CM | POA: Diagnosis present

## 2021-06-18 HISTORY — PX: MYOMECTOMY: SHX85

## 2021-06-18 LAB — POCT PREGNANCY, URINE: Preg Test, Ur: NEGATIVE

## 2021-06-18 LAB — TYPE AND SCREEN
ABO/RH(D): O POS
Antibody Screen: POSITIVE

## 2021-06-18 SURGERY — MYOMECTOMY, ABDOMINAL APPROACH
Anesthesia: Regional | Site: Abdomen

## 2021-06-18 MED ORDER — DEXAMETHASONE SODIUM PHOSPHATE 10 MG/ML IJ SOLN
INTRAMUSCULAR | Status: AC
  Filled 2021-06-18: qty 1

## 2021-06-18 MED ORDER — SIMETHICONE 80 MG PO CHEW
80.0000 mg | CHEWABLE_TABLET | Freq: Four times a day (QID) | ORAL | Status: DC | PRN
  Administered 2021-06-19: 80 mg via ORAL
  Filled 2021-06-18: qty 1

## 2021-06-18 MED ORDER — KETOROLAC TROMETHAMINE 30 MG/ML IJ SOLN
INTRAMUSCULAR | Status: AC
  Filled 2021-06-18: qty 1

## 2021-06-18 MED ORDER — DOCUSATE SODIUM 100 MG PO CAPS
100.0000 mg | ORAL_CAPSULE | Freq: Two times a day (BID) | ORAL | Status: DC
  Administered 2021-06-18 – 2021-06-20 (×4): 100 mg via ORAL
  Filled 2021-06-18 (×4): qty 1

## 2021-06-18 MED ORDER — TRANEXAMIC ACID-NACL 1000-0.7 MG/100ML-% IV SOLN
INTRAVENOUS | Status: DC | PRN
  Administered 2021-06-18: 1000 mg via INTRAVENOUS

## 2021-06-18 MED ORDER — FENTANYL CITRATE (PF) 250 MCG/5ML IJ SOLN
INTRAMUSCULAR | Status: DC | PRN
  Administered 2021-06-18: 100 ug via INTRAVENOUS
  Administered 2021-06-18: 50 ug via INTRAVENOUS

## 2021-06-18 MED ORDER — SUCCINYLCHOLINE CHLORIDE 200 MG/10ML IV SOSY
PREFILLED_SYRINGE | INTRAVENOUS | Status: AC
  Filled 2021-06-18: qty 10

## 2021-06-18 MED ORDER — LIDOCAINE 2% (20 MG/ML) 5 ML SYRINGE
INTRAMUSCULAR | Status: DC | PRN
Start: 2021-06-18 — End: 2021-06-18
  Administered 2021-06-18: 60 mg via INTRAVENOUS

## 2021-06-18 MED ORDER — BUPIVACAINE HCL (PF) 0.5 % IJ SOLN
INTRAMUSCULAR | Status: DC | PRN
  Administered 2021-06-18: 30 mL via PERINEURAL

## 2021-06-18 MED ORDER — ORAL CARE MOUTH RINSE: 15.0000 mL | Freq: Once | OROMUCOSAL | Status: AC

## 2021-06-18 MED ORDER — MIDAZOLAM HCL 2 MG/2ML IJ SOLN
INTRAMUSCULAR | Status: DC | PRN
  Administered 2021-06-18: 2 mg via INTRAVENOUS

## 2021-06-18 MED ORDER — ONDANSETRON HCL 4 MG PO TABS
4.0000 mg | ORAL_TABLET | Freq: Four times a day (QID) | ORAL | Status: DC | PRN
  Administered 2021-06-19: 4 mg via ORAL
  Filled 2021-06-18: qty 1

## 2021-06-18 MED ORDER — VASOPRESSIN 20 UNIT/ML IV SOLN
INTRAVENOUS | Status: AC
  Filled 2021-06-18: qty 1

## 2021-06-18 MED ORDER — OXYCODONE HCL 5 MG PO TABS
5.0000 mg | ORAL_TABLET | ORAL | Status: DC | PRN
  Administered 2021-06-18 – 2021-06-20 (×9): 10 mg via ORAL
  Administered 2021-06-20: 5 mg via ORAL
  Filled 2021-06-18 (×2): qty 2
  Filled 2021-06-18: qty 1
  Filled 2021-06-18 (×7): qty 2

## 2021-06-18 MED ORDER — OXYCODONE HCL 5 MG PO TABS: 5.0000 mg | ORAL_TABLET | Freq: Once | ORAL | Status: DC | PRN

## 2021-06-18 MED ORDER — DEXAMETHASONE SODIUM PHOSPHATE 10 MG/ML IJ SOLN
INTRAMUSCULAR | Status: DC | PRN
  Administered 2021-06-18: 10 mg via INTRAVENOUS

## 2021-06-18 MED ORDER — PROPOFOL 10 MG/ML IV BOLUS
INTRAVENOUS | Status: AC
  Filled 2021-06-18: qty 20

## 2021-06-18 MED ORDER — IBUPROFEN 600 MG PO TABS
600.0000 mg | ORAL_TABLET | Freq: Four times a day (QID) | ORAL | Status: DC
  Administered 2021-06-18 – 2021-06-20 (×8): 600 mg via ORAL
  Filled 2021-06-18 (×8): qty 1

## 2021-06-18 MED ORDER — HYDROMORPHONE HCL 1 MG/ML IJ SOLN
INTRAMUSCULAR | Status: AC
  Filled 2021-06-18: qty 1

## 2021-06-18 MED ORDER — ONDANSETRON HCL 4 MG/2ML IJ SOLN
INTRAMUSCULAR | Status: DC | PRN
  Administered 2021-06-18: 4 mg via INTRAVENOUS

## 2021-06-18 MED ORDER — MEPERIDINE HCL 25 MG/ML IJ SOLN: 6.2500 mg | INTRAMUSCULAR | Status: DC | PRN

## 2021-06-18 MED ORDER — HYDROMORPHONE HCL 1 MG/ML IJ SOLN
INTRAMUSCULAR | Status: DC | PRN
  Administered 2021-06-18 (×2): .5 mg via INTRAVENOUS

## 2021-06-18 MED ORDER — PROPOFOL 10 MG/ML IV BOLUS
INTRAVENOUS | Status: DC | PRN
  Administered 2021-06-18: 300 mg via INTRAVENOUS

## 2021-06-18 MED ORDER — ACETAMINOPHEN 500 MG PO TABS
1000.0000 mg | ORAL_TABLET | Freq: Once | ORAL | Status: AC
  Administered 2021-06-18: 1000 mg via ORAL

## 2021-06-18 MED ORDER — 0.9 % SODIUM CHLORIDE (POUR BTL) OPTIME
TOPICAL | Status: DC | PRN
  Administered 2021-06-18: 1000 mL

## 2021-06-18 MED ORDER — ACETAMINOPHEN 325 MG PO TABS
650.0000 mg | ORAL_TABLET | ORAL | Status: DC | PRN
  Administered 2021-06-19 – 2021-06-20 (×2): 650 mg via ORAL
  Filled 2021-06-18 (×2): qty 2

## 2021-06-18 MED ORDER — MIDAZOLAM HCL 2 MG/2ML IJ SOLN
INTRAMUSCULAR | Status: AC
  Filled 2021-06-18: qty 2

## 2021-06-18 MED ORDER — TRANEXAMIC ACID-NACL 1000-0.7 MG/100ML-% IV SOLN
INTRAVENOUS | Status: AC
  Filled 2021-06-18: qty 100

## 2021-06-18 MED ORDER — SODIUM CHLORIDE (PF) 0.9 % IJ SOLN
INTRAMUSCULAR | Status: AC
  Filled 2021-06-18: qty 100

## 2021-06-18 MED ORDER — KETOROLAC TROMETHAMINE 30 MG/ML IJ SOLN
INTRAMUSCULAR | Status: DC | PRN
Start: 2021-06-18 — End: 2021-06-18
  Administered 2021-06-18: 30 mg via INTRAVENOUS

## 2021-06-18 MED ORDER — KETOROLAC TROMETHAMINE 30 MG/ML IJ SOLN: 30.0000 mg | Freq: Once | INTRAMUSCULAR | Status: DC | PRN

## 2021-06-18 MED ORDER — ALUM & MAG HYDROXIDE-SIMETH 200-200-20 MG/5ML PO SUSP: 30.0000 mL | ORAL | Status: DC | PRN

## 2021-06-18 MED ORDER — DIPHENHYDRAMINE HCL 50 MG/ML IJ SOLN: 12.5000 mg | Freq: Four times a day (QID) | INTRAMUSCULAR | Status: DC | PRN

## 2021-06-18 MED ORDER — LACTATED RINGERS IV SOLN: INTRAVENOUS | Status: DC

## 2021-06-18 MED ORDER — SUGAMMADEX SODIUM 200 MG/2ML IV SOLN
INTRAVENOUS | Status: DC | PRN
Start: 2021-06-18 — End: 2021-06-18
  Administered 2021-06-18: 200 mg via INTRAVENOUS

## 2021-06-18 MED ORDER — ROCURONIUM BROMIDE 10 MG/ML (PF) SYRINGE
PREFILLED_SYRINGE | INTRAVENOUS | Status: AC
  Filled 2021-06-18: qty 10

## 2021-06-18 MED ORDER — LIDOCAINE 2% (20 MG/ML) 5 ML SYRINGE
INTRAMUSCULAR | Status: AC
  Filled 2021-06-18: qty 5

## 2021-06-18 MED ORDER — BUPIVACAINE LIPOSOME 1.3 % IJ SUSP
INTRAMUSCULAR | Status: DC | PRN
  Administered 2021-06-18: 10 mL via PERINEURAL

## 2021-06-18 MED ORDER — ARTIFICIAL TEARS OPHTHALMIC OINT
TOPICAL_OINTMENT | OPHTHALMIC | Status: AC
  Filled 2021-06-18: qty 3.5

## 2021-06-18 MED ORDER — DEXMEDETOMIDINE (PRECEDEX) IN NS 20 MCG/5ML (4 MCG/ML) IV SYRINGE
PREFILLED_SYRINGE | INTRAVENOUS | Status: DC | PRN
  Administered 2021-06-18: 8 ug via INTRAVENOUS
  Administered 2021-06-18: 4 ug via INTRAVENOUS
  Administered 2021-06-18: 8 ug via INTRAVENOUS

## 2021-06-18 MED ORDER — AMISULPRIDE (ANTIEMETIC) 5 MG/2ML IV SOLN: 10.0000 mg | Freq: Once | INTRAVENOUS | Status: DC | PRN

## 2021-06-18 MED ORDER — HYDROMORPHONE HCL 1 MG/ML IJ SOLN
INTRAMUSCULAR | Status: AC
  Filled 2021-06-18: qty 0.5

## 2021-06-18 MED ORDER — NALOXONE HCL 0.4 MG/ML IJ SOLN: 0.4000 mg | INTRAMUSCULAR | Status: DC | PRN

## 2021-06-18 MED ORDER — SCOPOLAMINE 1 MG/3DAYS TD PT72
1.0000 | MEDICATED_PATCH | TRANSDERMAL | Status: DC
  Administered 2021-06-18: 1 via TRANSDERMAL

## 2021-06-18 MED ORDER — GLYCOPYRROLATE PF 0.2 MG/ML IJ SOSY
PREFILLED_SYRINGE | INTRAMUSCULAR | Status: AC
  Filled 2021-06-18: qty 1

## 2021-06-18 MED ORDER — PROMETHAZINE HCL 25 MG/ML IJ SOLN: 12.5000 mg | Freq: Once | INTRAMUSCULAR | Status: DC | PRN

## 2021-06-18 MED ORDER — KETAMINE HCL 10 MG/ML IJ SOLN
INTRAMUSCULAR | Status: DC | PRN
Start: 2021-06-18 — End: 2021-06-18
  Administered 2021-06-18: 30 mg via INTRAVENOUS
  Administered 2021-06-18 (×2): 10 mg via INTRAVENOUS

## 2021-06-18 MED ORDER — GLYCOPYRROLATE PF 0.2 MG/ML IJ SOSY
PREFILLED_SYRINGE | INTRAMUSCULAR | Status: DC | PRN
  Administered 2021-06-18: .1 mg via INTRAVENOUS

## 2021-06-18 MED ORDER — POVIDONE-IODINE 10 % EX SWAB
2.0000 "application " | Freq: Once | CUTANEOUS | Status: AC
  Administered 2021-06-18: 2 via TOPICAL

## 2021-06-18 MED ORDER — CEFAZOLIN IN SODIUM CHLORIDE 3-0.9 GM/100ML-% IV SOLN
3.0000 g | Freq: Once | INTRAVENOUS | Status: AC
  Administered 2021-06-18: 3 g via INTRAVENOUS
  Filled 2021-06-18: qty 100

## 2021-06-18 MED ORDER — ONDANSETRON HCL 4 MG/2ML IJ SOLN
INTRAMUSCULAR | Status: AC
  Filled 2021-06-18: qty 2

## 2021-06-18 MED ORDER — ENOXAPARIN SODIUM 80 MG/0.8ML IJ SOSY
80.0000 mg | PREFILLED_SYRINGE | INTRAMUSCULAR | Status: DC
  Administered 2021-06-19 – 2021-06-20 (×2): 80 mg via SUBCUTANEOUS
  Filled 2021-06-18 (×2): qty 0.8

## 2021-06-18 MED ORDER — ROCURONIUM BROMIDE 10 MG/ML (PF) SYRINGE
PREFILLED_SYRINGE | INTRAVENOUS | Status: DC | PRN
Start: 2021-06-18 — End: 2021-06-18
  Administered 2021-06-18: 100 mg via INTRAVENOUS

## 2021-06-18 MED ORDER — OXYCODONE HCL 5 MG/5ML PO SOLN: 5.0000 mg | Freq: Once | ORAL | Status: DC | PRN

## 2021-06-18 MED ORDER — SCOPOLAMINE 1 MG/3DAYS TD PT72
MEDICATED_PATCH | TRANSDERMAL | Status: AC
  Filled 2021-06-18: qty 1

## 2021-06-18 MED ORDER — BUPIVACAINE HCL (PF) 0.5 % IJ SOLN
INTRAMUSCULAR | Status: AC
  Filled 2021-06-18: qty 30

## 2021-06-18 MED ORDER — DIPHENHYDRAMINE HCL 12.5 MG/5ML PO ELIX
12.5000 mg | ORAL_SOLUTION | Freq: Four times a day (QID) | ORAL | Status: DC | PRN
  Administered 2021-06-19 – 2021-06-20 (×2): 12.5 mg via ORAL
  Filled 2021-06-18 (×2): qty 5

## 2021-06-18 MED ORDER — VASOPRESSIN 20 UNIT/ML IV SOLN
INTRAVENOUS | Status: DC | PRN
  Administered 2021-06-18: 13 mL via INTRAMUSCULAR

## 2021-06-18 MED ORDER — BUPIVACAINE HCL (PF) 0.5 % IJ SOLN
INTRAMUSCULAR | Status: DC | PRN
  Administered 2021-06-18: 10 mL

## 2021-06-18 MED ORDER — HYDROMORPHONE HCL 1 MG/ML IJ SOLN
0.2500 mg | INTRAMUSCULAR | Status: DC | PRN
  Administered 2021-06-18 (×2): 0.25 mg via INTRAVENOUS

## 2021-06-18 MED ORDER — FENTANYL CITRATE (PF) 250 MCG/5ML IJ SOLN
INTRAMUSCULAR | Status: AC
  Filled 2021-06-18: qty 5

## 2021-06-18 MED ORDER — ONDANSETRON HCL 4 MG/2ML IJ SOLN: 4.0000 mg | Freq: Four times a day (QID) | INTRAMUSCULAR | Status: DC | PRN

## 2021-06-18 MED ORDER — SODIUM CHLORIDE 0.9% FLUSH: 9.0000 mL | INTRAVENOUS | Status: DC | PRN

## 2021-06-18 MED ORDER — SOD CITRATE-CITRIC ACID 500-334 MG/5ML PO SOLN
30.0000 mL | ORAL | Status: AC
  Administered 2021-06-18: 30 mL via ORAL
  Filled 2021-06-18: qty 30

## 2021-06-18 MED ORDER — KETAMINE HCL 50 MG/5ML IJ SOSY
PREFILLED_SYRINGE | INTRAMUSCULAR | Status: AC
  Filled 2021-06-18: qty 5

## 2021-06-18 MED ORDER — SUCCINYLCHOLINE CHLORIDE 200 MG/10ML IV SOSY
PREFILLED_SYRINGE | INTRAVENOUS | Status: DC | PRN
  Administered 2021-06-18: 200 mg via INTRAVENOUS

## 2021-06-18 MED ORDER — CHLORHEXIDINE GLUCONATE 0.12 % MT SOLN
15.0000 mL | Freq: Once | OROMUCOSAL | Status: AC
  Administered 2021-06-18: 15 mL via OROMUCOSAL
  Filled 2021-06-18: qty 15

## 2021-06-18 MED ORDER — HYDROMORPHONE 1 MG/ML IV SOLN
INTRAVENOUS | Status: DC
  Administered 2021-06-18: 2.1 mg via INTRAVENOUS
  Administered 2021-06-19: 0.9 mg via INTRAVENOUS
  Administered 2021-06-19: 1.2 mg via INTRAVENOUS

## 2021-06-18 MED ORDER — ACETAMINOPHEN 10 MG/ML IV SOLN
INTRAVENOUS | Status: AC
  Filled 2021-06-18: qty 100

## 2021-06-18 SURGICAL SUPPLY — 38 items
BAG COUNTER SPONGE SURGICOUNT (BAG) ×2 IMPLANT
BARRIER ADHS 3X4 INTERCEED (GAUZE/BANDAGES/DRESSINGS) ×2 IMPLANT
BENZOIN TINCTURE PRP APPL 2/3 (GAUZE/BANDAGES/DRESSINGS) ×1 IMPLANT
CANISTER SUCT 3000ML PPV (MISCELLANEOUS) ×4 IMPLANT
DRAPE WARM FLUID 44X44 (DRAPES) IMPLANT
DRSG OPSITE POSTOP 4X10 (GAUZE/BANDAGES/DRESSINGS) ×2 IMPLANT
DRSG OPSITE POSTOP 4X12 (GAUZE/BANDAGES/DRESSINGS) ×1 IMPLANT
DURAPREP 26ML APPLICATOR (WOUND CARE) ×2 IMPLANT
GAUZE 4X4 16PLY ~~LOC~~+RFID DBL (SPONGE) ×2 IMPLANT
GLOVE SURG ENC MOIS LTX SZ6.5 (GLOVE) ×2 IMPLANT
GLOVE SURG UNDER POLY LF SZ7 (GLOVE) ×6 IMPLANT
GOWN STRL REUS W/ TWL LRG LVL3 (GOWN DISPOSABLE) ×3 IMPLANT
GOWN STRL REUS W/TWL LRG LVL3 (GOWN DISPOSABLE) ×3
HIBICLENS CHG 4% 4OZ BTL (MISCELLANEOUS) ×2 IMPLANT
KIT TURNOVER KIT B (KITS) ×2 IMPLANT
NEEDLE HYPO 22GX1.5 SAFETY (NEEDLE) ×2 IMPLANT
NS IRRIG 1000ML POUR BTL (IV SOLUTION) ×2 IMPLANT
PACK ABDOMINAL GYN (CUSTOM PROCEDURE TRAY) ×2 IMPLANT
PAD ARMBOARD 7.5X6 YLW CONV (MISCELLANEOUS) ×2 IMPLANT
PAD OB MATERNITY 4.3X12.25 (PERSONAL CARE ITEMS) ×2 IMPLANT
PENCIL SMOKE EVAC W/HOLSTER (ELECTROSURGICAL) ×2 IMPLANT
RETRACTOR TRAXI PANNICULUS (MISCELLANEOUS) IMPLANT
SPONGE T-LAP 18X18 ~~LOC~~+RFID (SPONGE) ×2 IMPLANT
STRIP CLOSURE SKIN 1/2X4 (GAUZE/BANDAGES/DRESSINGS) ×1 IMPLANT
SUT PLAIN 2 0 XLH (SUTURE) ×2 IMPLANT
SUT VIC AB 0 CT1 18XCR BRD8 (SUTURE) IMPLANT
SUT VIC AB 0 CT1 36 (SUTURE) ×4 IMPLANT
SUT VIC AB 0 CT1 8-18 (SUTURE) ×2
SUT VIC AB 2-0 CT1 27 (SUTURE) ×1
SUT VIC AB 2-0 CT1 36 (SUTURE) ×1 IMPLANT
SUT VIC AB 2-0 CT1 TAPERPNT 27 (SUTURE) ×1 IMPLANT
SUT VIC AB 2-0 SH 27 (SUTURE) ×3
SUT VIC AB 2-0 SH 27X BRD (SUTURE) IMPLANT
SUT VIC AB 4-0 PS2 27 (SUTURE) ×2 IMPLANT
SYR CONTROL 10ML LL (SYRINGE) ×2 IMPLANT
TOWEL GREEN STERILE FF (TOWEL DISPOSABLE) ×4 IMPLANT
TRAXI PANNICULUS RETRACTOR (MISCELLANEOUS) ×1
TRAY FOLEY W/BAG SLVR 14FR (SET/KITS/TRAYS/PACK) ×2 IMPLANT

## 2021-06-18 NOTE — Anesthesia Procedure Notes (Signed)
Anesthesia Regional Block: TAP block   Pre-Anesthetic Checklist: , timeout performed,  Correct Patient, Correct Site, Correct Laterality,  Correct Procedure, Correct Position, site marked,  Risks and benefits discussed,  Surgical consent,  Pre-op evaluation,  At surgeon's request and post-op pain management  Laterality: Left and Right  Prep: Maximum Sterile Barrier Precautions used, chloraprep       Needles:  Injection technique: Single-shot  Needle Type: Echogenic Stimulator Needle     Needle Length: 9cm  Needle Gauge: 22     Additional Needles:   Procedures:,,,, ultrasound used (permanent image in chart),,    Narrative:  Start time: 06/18/2021 7:50 AM End time: 06/18/2021 8:00 AM Injection made incrementally with aspirations every 5 mL.  Performed by: Personally  Anesthesiologist: Pervis Hocking, DO  Additional Notes: Monitors applied. No increased pain on injection. No increased resistance to injection. Injection made in 5cc increments. Good needle visualization. Patient tolerated procedure well.   Technically challenging 2/2 habitus

## 2021-06-18 NOTE — Anesthesia Postprocedure Evaluation (Signed)
Anesthesia Post Note  Patient: Judy Hanna  Procedure(s) Performed: ABDOMINAL MYOMECTOMY (Abdomen)     Patient location during evaluation: PACU Anesthesia Type: Regional and General Level of consciousness: awake and alert, oriented and patient cooperative Pain management: pain level controlled Vital Signs Assessment: post-procedure vital signs reviewed and stable Respiratory status: spontaneous breathing, nonlabored ventilation and respiratory function stable Cardiovascular status: blood pressure returned to baseline and stable Postop Assessment: no apparent nausea or vomiting Anesthetic complications: no   No notable events documented.  Last Vitals:  Vitals:   06/18/21 1110 06/18/21 1125  BP: (!) 155/83 (!) 147/89  Pulse: 96 98  Resp: 19 (!) 22  Temp:  36.9 C  SpO2: 97% 96%    Last Pain:  Vitals:   06/18/21 1110  TempSrc:   PainSc: Gravity

## 2021-06-18 NOTE — Anesthesia Preprocedure Evaluation (Addendum)
Anesthesia Evaluation  Patient identified by MRN, date of birth, ID band Patient awake    Reviewed: Allergy & Precautions, NPO status , Patient's Chart, lab work & pertinent test results  Airway Mallampati: III  TM Distance: >3 FB Neck ROM: Full    Dental  (+) Dental Advisory Given, Teeth Intact   Pulmonary Current Smoker and Patient abstained from smoking.,  3 pack year history, current smoker about 5cigg/d Snores at night, no witnessed apneas  Albuterol uses about once a week   Pulmonary exam normal breath sounds clear to auscultation       Cardiovascular hypertension (160/85 in preop, per pt normally high at her appts- no meds), Normal cardiovascular exam Rhythm:Regular Rate:Normal     Neuro/Psych  Headaches, PSYCHIATRIC DISORDERS Anxiety Depression    GI/Hepatic Neg liver ROS, GERD  Medicated and Controlled,  Endo/Other  Morbid obesityBMI 53  Renal/GU negative Renal ROS  Female GU complaint (fibroid)     Musculoskeletal negative musculoskeletal ROS (+)   Abdominal (+) + obese,   Peds negative pediatric ROS (+)  Hematology negative hematology ROS (+) hct 41.1   Anesthesia Other Findings   Reproductive/Obstetrics negative OB ROS                            Anesthesia Physical Anesthesia Plan  ASA: 3  Anesthesia Plan: General and Regional   Post-op Pain Management: Regional block*, Ofirmev IV (intra-op)*, Toradol IV (intra-op)*, Dilaudid IV and Ketamine IV*   Induction: Intravenous  PONV Risk Score and Plan: 2 and Ondansetron, Dexamethasone, Midazolam, Scopolamine patch - Pre-op and Treatment may vary due to age or medical condition  Airway Management Planned: Oral ETT  Additional Equipment: None  Intra-op Plan:   Post-operative Plan: Extubation in OR  Informed Consent: I have reviewed the patients History and Physical, chart, labs and discussed the procedure including the  risks, benefits and alternatives for the proposed anesthesia with the patient or authorized representative who has indicated his/her understanding and acceptance.     Dental advisory given  Plan Discussed with: CRNA  Anesthesia Plan Comments: (glidescope available)       Anesthesia Quick Evaluation

## 2021-06-18 NOTE — Transfer of Care (Signed)
Immediate Anesthesia Transfer of Care Note  Patient: Judy Hanna  Procedure(s) Performed: ABDOMINAL MYOMECTOMY (Abdomen)  Patient Location: PACU  Anesthesia Type:General  Level of Consciousness: awake, alert  and oriented  Airway & Oxygen Therapy: Patient Spontanous Breathing and Patient connected to face mask oxygen  Post-op Assessment: Report given to RN and Post -op Vital signs reviewed and stable  Post vital signs: Reviewed and stable  Last Vitals:  Vitals Value Taken Time  BP 161/100 06/18/21 1010  Temp 37 C 06/18/21 1010  Pulse 97 06/18/21 1013  Resp 22 06/18/21 1022  SpO2 99 % 06/18/21 1013  Vitals shown include unvalidated device data.  Last Pain:  Vitals:   06/18/21 1022  TempSrc:   PainSc: 10-Worst pain ever         Complications: No notable events documented.

## 2021-06-18 NOTE — Anesthesia Procedure Notes (Signed)
Procedure Name: Intubation Date/Time: 06/18/2021 7:47 AM Performed by: Alain Marion, CRNA Pre-anesthesia Checklist: Patient identified, Emergency Drugs available, Suction available and Patient being monitored Patient Re-evaluated:Patient Re-evaluated prior to induction Oxygen Delivery Method: Circle System Utilized Preoxygenation: Pre-oxygenation with 100% oxygen Induction Type: IV induction Ventilation: Mask ventilation without difficulty Laryngoscope Size: Miller and 2 Grade View: Grade I Tube type: Oral Tube size: 7.0 mm Number of attempts: 1 Airway Equipment and Method: Stylet and Oral airway Placement Confirmation: ETT inserted through vocal cords under direct vision, positive ETCO2 and breath sounds checked- equal and bilateral Secured at: 23 cm Tube secured with: Tape Dental Injury: Teeth and Oropharynx as per pre-operative assessment

## 2021-06-18 NOTE — Op Note (Signed)
Judy Hanna PROCEDURE DATE: 06/18/2021  PREOPERATIVE DIAGNOSIS: Symptomatic uterine leiomyomata. POSTOPERATIVE DIAGNOSIS: The same PROCEDURE: Abdominal Myomectomy SURGEON: Lynnda Shields, MD ASSISTANT: Verita Schneiders, MD  An experienced assistant was required given the standard of surgical care given the complexity of the case.  This assistant was needed for exposure, dissection, suctioning, retraction, instrument exchange,, and for overall help during the procedure.   INDICATIONS: 36 y.o. G3P0030 here for abdominal myomectomy for symptomatic uterine leiomyomata.  Risks of surgery were discussed with the patient including but not limited to: bleeding which may require transfusion or reoperation and hysterectomy (3-4% risk of intraoperative conversion to hysterectomy); infection which may require antibiotics; injury to bowel, bladder, ureters or other surrounding organs; need for additional procedures; formation of intraperitoneal adhesions that may lead to other complications or make future surgeries more difficult;  thromboembolic phenomenon, incisional problems and other postoperative/anesthesia complications.  Also discussed possible need for cesarean deliveries at 72 - [redacted] weeks gestation for subsequent pregnancies if the endometrial cavity is breached, and possible recurrence of fibroids as 25% of women who undergo this procedure require further treatment. Written informed consent was obtained.    FINDINGS:  Uterine leiomyomata. 6 in number, ranging in size  2-8 cm. Endometrial cavity was not breached.  ANESTHESIA:    General INTRAVENOUS FLUIDS:1700  ml ESTIMATED BLOOD LOSS:300 ml URINE OUTPUT: 300 ml SPECIMENS: Leiomyomata sent to pathology COMPLICATIONS: None immediate  PROCEDURE IN DETAIL:  The patient received IV preoperative antibiotics approximately 30 minutes prior to procedure.  She was then taken to the operating room and general anesthesia was administered without difficulty and  SCDs were placed on the lower extremities.   She was placed in dorsal supine position and prepped and draped in a sterile manner.  A Foley catheter was inserted into the bladder and attached to constant drainage.  After an adequate timeout was performed, attention was then turned to the abdomen where a Pfannenstiel incision was made with a scalpel.  This incision was carried down to the fascia using electrocautery.  The fascia was incised in the midline and this incision was extended bilaterally using the Mayo scissors.  Kocher's were applied to the superior aspect of the fascial incision, and the rectus muscles were dissected off bluntly and sharply using the Mayo scissors.  A similar process was carried out at the inferior aspect of the incision.  The rectus muscles were separated in the midline bluntly and the peritoneum was entered bluntly, with good visualization of the bladder and bowel.     Attention was then turned to the uterus where multiple uterine leiomyomata were noted.  The bowel were packed away with moist laparotomy sponges and an abdominal retractor was placed.  The uterus was then delivered up through the incision. Attention was then turned to the uterine surface where the largest leiomyoma was identified. Vasopressin solution was injected over the surface of the leiomyoma to aid with hemostasis.   A vertical incision was made over the uterine leiomyoma into the leiomyoma, and the capsule was recognized.  Using blunt methods, the leiomyoma was freed from the surrounding myometrial tissue and removed intact.  Other leiomyomata were removed in similar fashion.  After removal of all the leiomyomata which were both on the anterior and posterior aspects of the uterus, the incisions were closed in layers. using 0 Vicryl running interlocking stitches, and the serosa was reapproximated using a 3-0 Vicryl baseball stitch.  Overall good hemostasis was noted.   The uterus was returned to the  abdomen.  The  laparotomy sponges were then removed from the abdomen, and an Interceed sheet was placed over the uterus as an adhesive barrier. The abdominal retractor was then removed. The peritoneum was closed using 2-0 Vicryl running stitch.  The fascia was reapproximated with 0 Vicryl running stitch and the subcutaneous layer was reapproximated with 2-0 plain gut interrupted sutures.  The skin was closed with 3-0 vicryl subcuticular stitch using a curved needle. Steristrips and honeycomb dressing were applied. The patient tolerated the procedure well. There were no complications during this case.  Sponge, lap, needle and instrument counts were correct x 3.  The patient was taken to the recovery room extubated and in stable condition.   Lynnda Shields, MD, [place] Attending [place], [place]

## 2021-06-18 NOTE — Interval H&P Note (Signed)
History and Physical Interval Note:  06/18/2021 7:24 AM  Judy Hanna  has presented today for surgery, with the diagnosis of Fibroids.  The various methods of treatment have been discussed with the patient and family. After consideration of risks, benefits and other options for treatment, the patient has consented to  Procedure(s) with comments: ABDOMINAL MYOMECTOMY (N/A) - TAP BLOCK as a surgical intervention.  The patient's history has been reviewed, patient examined, no change in status, stable for surgery. Again emphasized the risk of possible hysterectomy if there is excessive blood loss or other complication.  Risk of bowel and bladder involvement also discussed. I have reviewed the patient's chart and labs.  Questions were answered to the patient's satisfaction.     Griffin Basil

## 2021-06-19 ENCOUNTER — Encounter (HOSPITAL_COMMUNITY): Payer: Self-pay | Admitting: Obstetrics and Gynecology

## 2021-06-19 LAB — CBC
HCT: 32.4 % — ABNORMAL LOW (ref 36.0–46.0)
Hemoglobin: 11.3 g/dL — ABNORMAL LOW (ref 12.0–15.0)
MCH: 26.7 pg (ref 26.0–34.0)
MCHC: 34.9 g/dL (ref 30.0–36.0)
MCV: 76.6 fL — ABNORMAL LOW (ref 80.0–100.0)
Platelets: 288 10*3/uL (ref 150–400)
RBC: 4.23 MIL/uL (ref 3.87–5.11)
RDW: 15.1 % (ref 11.5–15.5)
WBC: 20 10*3/uL — ABNORMAL HIGH (ref 4.0–10.5)
nRBC: 0 % (ref 0.0–0.2)

## 2021-06-19 LAB — CREATININE, SERUM
Creatinine, Ser: 0.75 mg/dL (ref 0.44–1.00)
GFR, Estimated: >60 mL/min (ref >60)

## 2021-06-19 LAB — SURGICAL PATHOLOGY

## 2021-06-19 MED ORDER — SODIUM CHLORIDE 0.9 % IV SOLN: 250.0000 mL | INTRAVENOUS | Status: DC | PRN

## 2021-06-19 MED ORDER — SODIUM CHLORIDE 0.9% FLUSH
3.0000 mL | Freq: Two times a day (BID) | INTRAVENOUS | Status: DC
  Administered 2021-06-19 (×2): 3 mL via INTRAVENOUS

## 2021-06-19 MED ORDER — SODIUM CHLORIDE 0.9% FLUSH
3.0000 mL | INTRAVENOUS | Status: DC | PRN
Start: 2021-06-19 — End: 2021-06-20

## 2021-06-19 NOTE — Progress Notes (Signed)
PCA discontinued at 0850. RN wasted 24 mL in stericycle with Leda Gauze, RN.

## 2021-06-19 NOTE — Progress Notes (Signed)
Gynecology Progress Note  Admission Date: 06/18/2021 Current Date: 06/19/2021 7:49 AM  Judy Hanna is a 36 y.o. G3P0030 HD#2  POD 1 admitted for abdominal myomectomy   History complicated by: Patient Active Problem List   Diagnosis Date Noted   Fibroid uterus 06/18/2021   S/P myomectomy 06/18/2021   PTSD (post-traumatic stress disorder) 03/10/2021   Major depressive disorder, recurrent episode, moderate with anxious distress ([place]) 03/04/2021   BMI 50.0-59.9, adult ([place]) 06/26/2020   Tobacco abuse 06/26/2020   Fibroids 05/22/2020   Axillary abscess 05/22/2020   Essential hypertension 05/22/2020   Positive depression screening 06/05/2019   Morbid obesity ([place]) 06/05/2019   Tobacco dependence 06/05/2019   Menstrual migraine without status migrainosus, not intractable 06/05/2019   History of abnormal cervical Pap smear 06/05/2019    ROS and patient/family/surgical history, located on admission H&P note dated 06/18/2021, have been reviewed, and there are no changes except as noted below Yesterday/Overnight Events:  Rested well, tolerated dilaudid PCA  Subjective:  Pt seen, sitting in chair.  Appears comfortable.  She notes she has passed flatus and has tolerated clear liquids with some regular diet.  She denies any significant nausea.  Pt with moderate pain control with PCA, states po meds worked better.  Objective:   Vitals:   06/19/21 0315 06/19/21 0320 06/19/21 0415 06/19/21 0635  BP:  134/79    Pulse:  90    Resp:  20 (!) 22   Temp:  98.3 F (36.8 C)    TempSrc:  Oral    SpO2: 92% 96% 98% 96%  Weight:      Height:        Temp:  [98.1 F (36.7 C)-98.6 F (37 C)] 98.3 F (36.8 C) (02/23 0320) Pulse Rate:  [90-112] 90 (02/23 0320) Resp:  [17-23] 22 (02/23 0415) BP: (134-161)/(68-100) 134/79 (02/23 0320) SpO2:  [92 %-99 %] 96 % (02/23 0635) I/O last 3 completed shifts: In: 3758.8 [I.V.:3658.8; IV Piggyback:100] Out: 2025 [Urine:1725; Blood:300] No intake/output  data recorded.  Intake/Output Summary (Last 24 hours) at 06/19/2021 0749 Last data filed at 06/19/2021 0600 Gross per 24 hour  Intake 3758.83 ml  Output 2025 ml  Net 1733.83 ml     Current Vital Signs 24h Vital Sign Ranges  T 98.3 F (36.8 C) Temp  Avg: 98.4 F (36.9 C)  Min: 98.1 F (36.7 C)  Max: 98.6 F (37 C)  BP 134/79 BP  Min: 134/79  Max: 161/100  HR 90 Pulse  Avg: 100.2  Min: 90  Max: 112  RR (!) 22  Resp  Avg: 19.9  Min: 17  Max: 23  SaO2 96 % Room Air SpO2  Avg: 96 %  Min: 92 %  Max: 99 %       24 Hour I/O Current Shift I/O  Time Ins Outs 02/22 0701 - 02/23 0700 In: 3758.8 [I.V.:3658.8] Out: 2025 [Urine:1725] No intake/output data recorded.   Patient Vitals for the past 12 hrs:  BP Temp Temp src Pulse Resp SpO2  06/19/21 0635 -- -- -- -- -- 96 %  06/19/21 0415 -- -- -- -- (!) 22 98 %  06/19/21 0320 134/79 98.3 F (36.8 C) Oral 90 20 96 %  06/19/21 0315 -- -- -- -- -- 92 %  06/19/21 0310 -- -- -- -- -- 92 %  06/19/21 0305 -- -- -- -- -- 92 %  06/19/21 0300 -- -- -- -- -- 95 %  06/19/21 0255 -- -- -- -- -- 95 %  06/19/21 0250 -- -- -- -- -- 94 %  06/19/21 0245 -- -- -- -- -- 97 %  06/19/21 0240 -- -- -- -- -- 97 %  06/19/21 0235 -- -- -- -- -- 97 %  06/19/21 0230 -- -- -- -- -- 97 %  06/19/21 0225 -- -- -- -- -- 98 %  06/19/21 0220 -- -- -- -- -- 98 %  06/19/21 0215 -- -- -- -- -- 98 %  06/19/21 0210 -- -- -- -- -- 97 %  06/19/21 0205 -- -- -- -- -- 99 %  06/19/21 0200 -- -- -- -- -- 98 %  06/19/21 0155 -- -- -- -- -- 97 %  06/19/21 0150 -- -- -- -- -- 99 %  06/19/21 0145 -- -- -- -- -- 97 %  06/19/21 0140 -- -- -- -- -- 96 %  06/19/21 0135 -- -- -- -- -- 97 %  06/19/21 0130 -- -- -- -- -- 97 %  06/19/21 0125 -- -- -- -- -- 97 %  06/19/21 0120 -- -- -- -- -- 98 %  06/19/21 0115 -- -- -- -- -- 96 %  06/19/21 0110 -- -- -- -- -- 96 %  06/19/21 0105 -- -- -- -- -- 96 %  06/19/21 0100 -- -- -- -- -- 97 %  06/19/21 0055 -- -- -- -- -- 96 %  06/19/21  0050 -- -- -- -- -- 98 %  06/19/21 0045 -- -- -- -- -- 97 %  06/19/21 0040 -- -- -- -- -- 97 %  06/19/21 0035 -- -- -- -- -- 97 %  06/19/21 0030 -- -- -- -- -- 98 %  06/19/21 0028 -- -- -- -- (!) 21 97 %  06/19/21 0025 -- -- -- -- -- 97 %  06/19/21 0020 -- -- -- -- -- 96 %  06/19/21 0015 -- -- -- -- -- 96 %  06/19/21 0010 -- -- -- -- -- 96 %  06/19/21 0005 -- -- -- -- -- 97 %  06/19/21 0000 -- -- -- -- -- 97 %  06/18/21 2359 (!) 148/69 98.5 F (36.9 C) Oral 100 20 97 %  06/18/21 2355 -- -- -- -- -- 95 %  06/18/21 2350 -- -- -- -- -- 96 %  06/18/21 2345 -- -- -- -- -- 95 %  06/18/21 2340 -- -- -- -- -- 95 %  06/18/21 2335 -- -- -- -- -- 95 %  06/18/21 2330 -- -- -- -- -- 95 %  06/18/21 2325 -- -- -- -- -- 95 %  06/18/21 2320 -- -- -- -- -- 94 %  06/18/21 2315 -- -- -- -- -- 95 %  06/18/21 2310 -- -- -- -- -- 95 %  06/18/21 2304 -- -- -- -- -- 94 %  06/18/21 2300 -- -- -- -- -- 95 %  06/18/21 2240 -- -- -- -- -- 98 %  06/18/21 2230 -- -- -- -- -- 98 %  06/18/21 2225 -- -- -- -- -- 98 %  06/18/21 2220 -- -- -- -- -- 98 %  06/18/21 2215 -- -- -- -- -- 97 %  06/18/21 2210 -- -- -- -- -- 96 %  06/18/21 2205 -- -- -- -- -- 96 %  06/18/21 2200 -- -- -- -- -- 96 %  06/18/21 2130 -- -- -- -- -- 93 %  06/18/21 2125 -- -- -- -- -- 95 %  06/18/21 2120 -- -- -- -- --  96 %  06/18/21 2115 -- -- -- -- -- 92 %  06/18/21 2110 -- -- -- -- -- 93 %  06/18/21 2105 -- -- -- -- -- 93 %  06/18/21 2100 -- -- -- -- -- 92 %  06/18/21 2055 -- -- -- -- -- 92 %  06/18/21 2050 -- -- -- -- -- 94 %  06/18/21 2025 -- -- -- -- -- 96 %  06/18/21 2020 -- -- -- -- -- 95 %  06/18/21 2015 -- -- -- -- -- 93 %  06/18/21 2010 -- -- -- -- -- 94 %  06/18/21 2005 -- -- -- -- -- 93 %  06/18/21 2001 -- -- -- -- 20 94 %  06/18/21 2000 -- -- -- -- -- 94 %  06/18/21 1952 (!) 135/96 98.4 F (36.9 C) Oral (!) 104 17 95 %     Patient Vitals for the past 24 hrs:  BP Temp Temp src Pulse Resp SpO2  06/19/21 0635 -- --  -- -- -- 96 %  06/19/21 0415 -- -- -- -- (!) 22 98 %  06/19/21 0320 134/79 98.3 F (36.8 C) Oral 90 20 96 %  06/19/21 0315 -- -- -- -- -- 92 %  06/19/21 0310 -- -- -- -- -- 92 %  06/19/21 0305 -- -- -- -- -- 92 %  06/19/21 0300 -- -- -- -- -- 95 %  06/19/21 0255 -- -- -- -- -- 95 %  06/19/21 0250 -- -- -- -- -- 94 %  06/19/21 0245 -- -- -- -- -- 97 %  06/19/21 0240 -- -- -- -- -- 97 %  06/19/21 0235 -- -- -- -- -- 97 %  06/19/21 0230 -- -- -- -- -- 97 %  06/19/21 0225 -- -- -- -- -- 98 %  06/19/21 0220 -- -- -- -- -- 98 %  06/19/21 0215 -- -- -- -- -- 98 %  06/19/21 0210 -- -- -- -- -- 97 %  06/19/21 0205 -- -- -- -- -- 99 %  06/19/21 0200 -- -- -- -- -- 98 %  06/19/21 0155 -- -- -- -- -- 97 %  06/19/21 0150 -- -- -- -- -- 99 %  06/19/21 0145 -- -- -- -- -- 97 %  06/19/21 0140 -- -- -- -- -- 96 %  06/19/21 0135 -- -- -- -- -- 97 %  06/19/21 0130 -- -- -- -- -- 97 %  06/19/21 0125 -- -- -- -- -- 97 %  06/19/21 0120 -- -- -- -- -- 98 %  06/19/21 0115 -- -- -- -- -- 96 %  06/19/21 0110 -- -- -- -- -- 96 %  06/19/21 0105 -- -- -- -- -- 96 %  06/19/21 0100 -- -- -- -- -- 97 %  06/19/21 0055 -- -- -- -- -- 96 %  06/19/21 0050 -- -- -- -- -- 98 %  06/19/21 0045 -- -- -- -- -- 97 %  06/19/21 0040 -- -- -- -- -- 97 %  06/19/21 0035 -- -- -- -- -- 97 %  06/19/21 0030 -- -- -- -- -- 98 %  06/19/21 0028 -- -- -- -- (!) 21 97 %  06/19/21 0025 -- -- -- -- -- 97 %  06/19/21 0020 -- -- -- -- -- 96 %  06/19/21 0015 -- -- -- -- -- 96 %  06/19/21 0010 -- -- -- -- -- 96 %  06/19/21 0005 -- -- -- -- --  97 %  06/19/21 0000 -- -- -- -- -- 97 %  06/18/21 2359 (!) 148/69 98.5 F (36.9 C) Oral 100 20 97 %  06/18/21 2355 -- -- -- -- -- 95 %  06/18/21 2350 -- -- -- -- -- 96 %  06/18/21 2345 -- -- -- -- -- 95 %  06/18/21 2340 -- -- -- -- -- 95 %  06/18/21 2335 -- -- -- -- -- 95 %  06/18/21 2330 -- -- -- -- -- 95 %  06/18/21 2325 -- -- -- -- -- 95 %  06/18/21 2320 -- -- -- -- -- 94 %   06/18/21 2315 -- -- -- -- -- 95 %  06/18/21 2310 -- -- -- -- -- 95 %  06/18/21 2304 -- -- -- -- -- 94 %  06/18/21 2300 -- -- -- -- -- 95 %  06/18/21 2240 -- -- -- -- -- 98 %  06/18/21 2230 -- -- -- -- -- 98 %  06/18/21 2225 -- -- -- -- -- 98 %  06/18/21 2220 -- -- -- -- -- 98 %  06/18/21 2215 -- -- -- -- -- 97 %  06/18/21 2210 -- -- -- -- -- 96 %  06/18/21 2205 -- -- -- -- -- 96 %  06/18/21 2200 -- -- -- -- -- 96 %  06/18/21 2130 -- -- -- -- -- 93 %  06/18/21 2125 -- -- -- -- -- 95 %  06/18/21 2120 -- -- -- -- -- 96 %  06/18/21 2115 -- -- -- -- -- 92 %  06/18/21 2110 -- -- -- -- -- 93 %  06/18/21 2105 -- -- -- -- -- 93 %  06/18/21 2100 -- -- -- -- -- 92 %  06/18/21 2055 -- -- -- -- -- 92 %  06/18/21 2050 -- -- -- -- -- 94 %  06/18/21 2025 -- -- -- -- -- 96 %  06/18/21 2020 -- -- -- -- -- 95 %  06/18/21 2015 -- -- -- -- -- 93 %  06/18/21 2010 -- -- -- -- -- 94 %  06/18/21 2005 -- -- -- -- -- 93 %  06/18/21 2001 -- -- -- -- 20 94 %  06/18/21 2000 -- -- -- -- -- 94 %  06/18/21 1952 (!) 135/96 98.4 F (36.9 C) Oral (!) 104 17 95 %  06/18/21 1600 (!) 141/73 98.4 F (36.9 C) Oral (!) 112 18 --  06/18/21 1400 (!) 146/86 98.3 F (36.8 C) Oral 100 18 98 %  06/18/21 1300 (!) 142/92 98.3 F (36.8 C) Oral (!) 105 18 97 %  06/18/21 1147 (!) 148/96 98.1 F (36.7 C) -- (!) 107 20 98 %  06/18/21 1125 (!) 147/89 98.4 F (36.9 C) -- 98 (!) 22 96 %  06/18/21 1110 (!) 155/83 -- -- 96 19 97 %  06/18/21 1055 (!) 153/90 -- -- 93 (!) 23 99 %  06/18/21 1040 (!) 149/87 -- -- 96 18 99 %  06/18/21 1025 (!) 142/68 -- -- (!) 102 (!) 23 94 %  06/18/21 1010 (!) 161/100 98.6 F (37 C) -- 99 19 99 %    Physical exam: General appearance: alert, cooperative, appears stated age, no distress, and morbidly obese Abdomen:  soft NT/ND positive bowel sounds, appropriately tender GU: No gross VB Lungs: clear to auscultation bilaterally Heart: regular rate and rhythm Extremities: no calf pain  bilaterally, negative Holman's sign Skin: WNL Psych: appropriate Neurologic: Alert and oriented X 3, normal strength and tone. Normal  symmetric reflexes. Normal coordination and gait Wounds:  honeycomb bandage intact, minimal blood spotting central area of bandage Clean/dry/intact  Medications Current Facility-Administered Medications  Medication Dose Route Frequency Provider Last Rate Last Admin   0.9 %  sodium chloride infusion  250 mL Intravenous PRN Griffin Basil, MD       acetaminophen (TYLENOL) tablet 650 mg  650 mg Oral Q4H PRN Griffin Basil, MD       alum & mag hydroxide-simeth (MAALOX/MYLANTA) 200-200-20 MG/5ML suspension 30 mL  30 mL Oral Q4H PRN Griffin Basil, MD       diphenhydrAMINE (BENADRYL) injection 12.5 mg  12.5 mg Intravenous Q6H PRN Griffin Basil, MD       Or   diphenhydrAMINE (BENADRYL) 12.5 MG/5ML elixir 12.5 mg  12.5 mg Oral Q6H PRN Griffin Basil, MD       docusate sodium (COLACE) capsule 100 mg  100 mg Oral BID Griffin Basil, MD   100 mg at 06/18/21 2224   enoxaparin (LOVENOX) injection 80 mg  80 mg Subcutaneous Q24H Griffin Basil, MD       ibuprofen (ADVIL) tablet 600 mg  600 mg Oral Q6H Griffin Basil, MD   600 mg at 06/19/21 0630   naloxone [facility]) injection 0.4 mg  0.4 mg Intravenous PRN Griffin Basil, MD       And   sodium chloride flush (NS) 0.9 % injection 9 mL  9 mL Intravenous PRN Griffin Basil, MD       ondansetron [facility]) tablet 4 mg  4 mg Oral Q6H PRN Griffin Basil, MD       Or   ondansetron [facility]) injection 4 mg  4 mg Intravenous Q6H PRN Griffin Basil, MD       oxyCODONE (Oxy IR/ROXICODONE) immediate release tablet 5-10 mg  5-10 mg Oral Q4H PRN Griffin Basil, MD   10 mg at 06/19/21 0431   simethicone (MYLICON) chewable tablet 80 mg  80 mg Oral QID PRN Griffin Basil, MD       sodium chloride flush (NS) 0.9 % injection 3 mL  3 mL Intravenous Q12H Griffin Basil, MD       sodium chloride flush (NS) 0.9 %  injection 3 mL  3 mL Intravenous PRN Griffin Basil, MD          Labs  Recent Labs  Lab 06/19/21 0523  WBC 20.0*  HGB 11.3*  HCT 32.4*  PLT 288    Recent Labs  Lab 06/19/21 0523  CREATININE 0.75    Radiology N/a  Assessment & Plan:  POD 1 s/p abdominal myomectomy   *Pain: D/C PCA, continue oral medications for pain *FEN/GI: continue regular diet, good bowel function noted Saline lock IV Encourage ambulation and sitting in chair Anticipate d/c on 06/20/21  Code Status: Full Code  Lynnda Shields, MD  Faculty Attending [facility])

## 2021-06-20 ENCOUNTER — Other Ambulatory Visit (HOSPITAL_COMMUNITY): Payer: Self-pay

## 2021-06-20 MED ORDER — DOCUSATE SODIUM 100 MG PO CAPS
100.0000 mg | ORAL_CAPSULE | Freq: Two times a day (BID) | ORAL | 0 refills | Status: DC
Start: 2021-06-20 — End: 2021-07-15
  Filled 2021-06-20: qty 20, 10d supply, fill #0

## 2021-06-20 MED ORDER — OXYCODONE HCL 5 MG PO TABS
5.0000 mg | ORAL_TABLET | Freq: Once | ORAL | Status: AC
  Administered 2021-06-20: 5 mg via ORAL
  Filled 2021-06-20: qty 1

## 2021-06-20 MED ORDER — ONDANSETRON HCL 4 MG PO TABS
4.0000 mg | ORAL_TABLET | Freq: Four times a day (QID) | ORAL | 0 refills | Status: DC | PRN
  Filled 2021-06-20: qty 20, 5d supply, fill #0

## 2021-06-20 MED ORDER — SIMETHICONE 80 MG PO CHEW
80.0000 mg | CHEWABLE_TABLET | Freq: Four times a day (QID) | ORAL | 0 refills | Status: DC | PRN
  Filled 2021-06-20: qty 30, 8d supply, fill #0

## 2021-06-20 MED ORDER — OXYCODONE HCL 5 MG PO TABS
5.0000 mg | ORAL_TABLET | ORAL | 0 refills | Status: DC | PRN
  Filled 2021-06-20: qty 30, 3d supply, fill #0

## 2021-06-20 MED ORDER — IBUPROFEN 600 MG PO TABS
600.0000 mg | ORAL_TABLET | Freq: Four times a day (QID) | ORAL | 0 refills | Status: DC
Start: 2021-06-20 — End: 2022-05-18
  Filled 2021-06-20: qty 30, 8d supply, fill #0

## 2021-06-20 NOTE — Progress Notes (Signed)
Dr Elgie Congo called to get additional order for 5 mg oxycodone per patient request.

## 2021-06-20 NOTE — Discharge Summary (Signed)
Physician Discharge Summary  Patient ID: Judy Hanna MRN: [id number] DOB/AGE: 07-22-1985 36 y.o.  Admit date: 06/18/2021 Discharge date: 06/20/2021  Admission Diagnoses: symptomatic fibroid uterus  Discharge Diagnoses:  Principal Problem:   Fibroid uterus Active Problems:   S/P myomectomy   Discharged Condition: good  Hospital Course: Pt was admitted on 06/18/21 for scheduled abdominal myomectomy.  Please see separate operative note. She did well the night of surgery with dilaudid PCA.  POD 1 pt did well, she was tolerating clear liquids and was ready for regular diet.  Positive flatus noted.  POD 2.  Foley previously removed with good voiding noted.  Slightly less flatus on day 2 but pt still tolerating fluids and some regular diet.  She was ambulating well.  Pt discharged home on 06/20/21.  Consults: None  Significant Diagnostic Studies: none  Treatments: surgery: abdominal myomectomy  Discharge Exam: Blood pressure 127/60, pulse (!) 107, temperature 99.1 F (37.3 C), temperature source Oral, resp. rate 20, height 5\' 9"  (1.753 m), weight (!) 163.3 kg, last menstrual period 06/01/2021, SpO2 94 %. General appearance: alert, cooperative, appears stated age, no distress, and morbidly obese Head: Normocephalic, without obvious abnormality, atraumatic Resp: clear to auscultation bilaterally Cardio: regular rate and rhythm GI: soft, obese, nontender, nondistended positive bowel sounds, no tympany Extremities: extremities normal, atraumatic, no cyanosis or edema Skin: Skin color, texture, turgor normal. No rashes or lesions Incision/Wound: honeycomb intact, no excessive drainage noted  Disposition: Discharge disposition: 01-Home or Self Care       Discharge Instructions     Call MD for:  difficulty breathing, headache or visual disturbances   Complete by: As directed    Call MD for:  persistant dizziness or light-headedness   Complete by: As directed    Call MD for:   persistant nausea and vomiting   Complete by: As directed    Call MD for:  redness, tenderness, or signs of infection (pain, swelling, redness, odor or green/yellow discharge around incision site)   Complete by: As directed    Call MD for:  severe uncontrolled pain   Complete by: As directed    Call MD for:  temperature >100.4   Complete by: As directed    Diet - low sodium heart healthy   Complete by: As directed    Discharge wound care:   Complete by: As directed    Remove honeycomb on Monday   Driving Restrictions   Complete by: As directed    No driving or heavy lifting for 1-2 weeks or while taking narcotic medication   Increase activity slowly   Complete by: As directed    Sexual Activity Restrictions   Complete by: As directed    Pelvic rest 6 weeks      Allergies as of 06/20/2021   No Known Allergies      Medication List     STOP taking these medications    azithromycin 250 MG tablet Commonly known as: Zithromax Z-Pak   benzonatate 100 MG capsule Commonly known as: TESSALON   cyclobenzaprine 10 MG tablet Commonly known as: FLEXERIL   naproxen 500 MG tablet Commonly known as: NAPROSYN   nicotine 21 mg/24hr patch Commonly known as: NICODERM CQ - dosed in mg/24 hours   nicotine polacrilex 2 MG gum Commonly known as: NICORETTE   predniSONE 20 MG tablet Commonly known as: DELTASONE       TAKE these medications    acetaminophen 500 MG tablet Commonly known as: TYLENOL Take 1,000 mg by  mouth every 6 (six) hours as needed for fever or headache.   albuterol 108 (90 Base) MCG/ACT inhaler Commonly known as: VENTOLIN HFA Inhale 2 puffs into the lungs every 6 (six) hours as needed for wheezing or shortness of breath.   cholecalciferol 25 MCG (1000 UNIT) tablet Commonly known as: VITAMIN D3 Take 1,000 Units by mouth daily.   docusate sodium 100 MG capsule Commonly known as: COLACE Take 1 capsule (100 mg total) by mouth 2 (two) times daily.    escitalopram 10 MG tablet Commonly known as: Lexapro Take 1 tablet (10 mg total) by mouth daily.   hydrOXYzine 10 MG tablet Commonly known as: ATARAX Take 1 tablet (10 mg total) by mouth 3 (three) times daily as needed (may take one tablet as needed twice daily for anxiety, and 3 tablets at bedtime for sleep as needed).   ibuprofen 600 MG tablet Commonly known as: ADVIL Take 1 tablet (600 mg total) by mouth every 6 (six) hours.   multivitamin with minerals tablet Take 1 tablet by mouth 3 (three) times a week.   omeprazole 20 MG capsule Commonly known as: PRILOSEC Take 1 capsule (20 mg total) by mouth 2 (two) times daily before a meal.   ondansetron 4 MG tablet Commonly known as: ZOFRAN Take 1 tablet (4 mg total) by mouth every 6 (six) hours as needed for nausea.   oxyCODONE 5 MG immediate release tablet Commonly known as: Oxy IR/ROXICODONE Take 1-2 tablets (5-10 mg total) by mouth every 4 (four) hours as needed for moderate pain.   simethicone 80 MG chewable tablet Commonly known as: MYLICON Chew 1 tablet (80 mg total) by mouth 4 (four) times daily as needed for flatulence.   SUMAtriptan 50 MG tablet Commonly known as: Imitrex Take 1 tablet at the start of the headache.  May repeat in 2 hours if headache does not resolve.  Max 2 hours / 24-hour.   vitamin C 500 MG tablet Commonly known as: ASCORBIC ACID Take 500 mg by mouth daily.   zinc gluconate 50 MG tablet Take 50 mg by mouth daily.               Discharge Care Instructions  (From admission, onward)           Start     Ordered   06/20/21 0000  Discharge wound care:       Comments: Remove honeycomb on Monday   06/20/21 [place] [facility]. Schedule an appointment as soon as possible for a visit in 1 week(s).   Specialty: Obstetrics and Gynecology Why: For wound re-check Contact information: [place] [id number] [phone]        Griffin Basil, MD Follow up in 5 week(s).   Specialty: Obstetrics and Gynecology Contact information: [place] [address] [phone]                 Signed: Griffin Basil 06/20/2021, 9:55 AM

## 2021-06-20 NOTE — Progress Notes (Signed)
Discharge instructions reviewed with patient, patient has her TOC medications, and instructed on use, f/u appointment reviewed, s/s infections and reasons to call MD. Patient verbalizes understanding.

## 2021-06-20 NOTE — Plan of Care (Signed)
Problem: Activity: Goal: Risk for activity intolerance will decrease Outcome: Completed/Met   Problem: Nutrition: Goal: Adequate nutrition will be maintained Outcome: Completed/Met   Problem: Coping: Goal: Level of anxiety will decrease Outcome: Completed/Met   Problem: Elimination: Goal: Will not experience complications related to urinary retention Outcome: Completed/Met   Problem: Pain Managment: Goal: General experience of comfort will improve Outcome: Completed/Met   Problem: Education: Goal: Knowledge of the prescribed therapeutic regimen will improve Outcome: Completed/Met

## 2021-06-21 LAB — TYPE AND SCREEN
ABO/RH(D): O POS
Antibody Screen: POSITIVE
Donor AG Type: NEGATIVE
Donor AG Type: NEGATIVE
Donor AG Type: NEGATIVE
Donor AG Type: NEGATIVE
PT AG Type: NEGATIVE
Unit division: 0
Unit division: 0
Unit division: 0
Unit division: 0

## 2021-06-21 LAB — BPAM RBC
Blood Product Expiration Date: 202303012359
Blood Product Expiration Date: 202303182359
Blood Product Expiration Date: 202303182359
Blood Product Expiration Date: 202303182359
ISSUE DATE / TIME: 202301292123
ISSUE DATE / TIME: 202302150444
ISSUE DATE / TIME: 202302150444
ISSUE DATE / TIME: 202302150444
Unit Type and Rh: 5100
Unit Type and Rh: 5100
Unit Type and Rh: 5100
Unit Type and Rh: 5100

## 2021-06-23 ENCOUNTER — Telehealth: Payer: Self-pay

## 2021-06-23 NOTE — Telephone Encounter (Signed)
Transition Care Management Follow-up Telephone Call Date of discharge and from where: 06/20/2021 [facility] ([facility])  How have you been since you were released from the hospital? She stated she is feeling okay, only occasional weakness, dizziness.  Any questions or concerns? No  Items Reviewed: Did the pt receive and understand the discharge instructions provided? Yes  Medications obtained and verified? Yes  - she said she has all of her medications and did not have any questions about her med regime.  Other? No  Any new allergies since your discharge? No  Dietary orders reviewed? Yes Do you have support at home? Yes   Home Care and Equipment/Supplies: Were home health services ordered? no If so, what is the name of the agency? N/a  Has the agency set up a time to come to the patient's home? not applicable Were any new equipment or medical supplies ordered?  No What is the name of the medical supply agency? N/a Were you able to get the supplies/equipment? not applicable Do you have any questions related to the use of the equipment or supplies? No  Functional Questionnaire: (I = Independent and D = Dependent) ADLs: independent.  Needs some assistance with bathing.    Follow up appointments reviewed:  PCP Hospital f/u appt confirmed?  Patient will call to schedule an appointment Specialist Hospital f/u appt confirmed?  She needs to call to schedule GYN follow up.    Are transportation arrangements needed? No  If their condition worsens, is the pt aware to call PCP or go to the Emergency Dept.? Yes Was the patient provided with contact information for the PCP's office or ED? Yes Was to pt encouraged to call back with questions or concerns? Yes

## 2021-06-25 ENCOUNTER — Other Ambulatory Visit: Payer: Self-pay

## 2021-06-25 ENCOUNTER — Other Ambulatory Visit (HOSPITAL_COMMUNITY)
Admission: RE | Admit: 2021-06-25 | Discharge: 2021-06-25 | Disposition: A | Discharge status: Home / Self Care | Payer: Self-pay | Source: Ambulatory Visit | Attending: Family Medicine | Admitting: Family Medicine

## 2021-06-25 ENCOUNTER — Ambulatory Visit (INDEPENDENT_AMBULATORY_CARE_PROVIDER_SITE_OTHER): Payer: Self-pay

## 2021-06-25 ENCOUNTER — Encounter: Payer: Self-pay | Admitting: Obstetrics and Gynecology

## 2021-06-25 ENCOUNTER — Ambulatory Visit (INDEPENDENT_AMBULATORY_CARE_PROVIDER_SITE_OTHER): Payer: Self-pay | Admitting: Obstetrics and Gynecology

## 2021-06-25 VITALS — BP 125/87 | HR 130 | Wt 384.4 lb

## 2021-06-25 DIAGNOSIS — Z5189 Encounter for other specified aftercare: Secondary | ICD-10-CM | POA: Insufficient documentation

## 2021-06-25 DIAGNOSIS — Z9889 Other specified postprocedural states: Secondary | ICD-10-CM

## 2021-06-25 DIAGNOSIS — N898 Other specified noninflammatory disorders of vagina: Secondary | ICD-10-CM | POA: Insufficient documentation

## 2021-06-25 NOTE — Progress Notes (Signed)
?  CC: wound check ?Subjective:  ? ? Patient ID: Judy Hanna, female    DOB: 1986-01-01, 36 y.o.   MRN: [id number] ? ?HPI ?Pt seen.  She appeared comfortable and in no acute distress. Pt notes good bowel function and no issues with her incision.  ? ? ?Review of Systems ? ?   ?Objective:  ? Physical Exam ?Vitals:  ? 06/25/21 1324  ?BP: 125/87  ?Pulse: (!) 130  ? ?Wound : clean dry and intact.  Steristrips removed, slightly moist ? ? ?   ?Assessment & Plan:  ? ?1. Vaginal discharge ?Will check swab for any discharge ?- Cervicovaginal ancillary only( [place]) ? ?2. S/P myomectomy ? ? ?3. Visit for wound check ?Wound healing well, per pt she continues to recover without major issues, post op in 5 weeks ? ?4. Vaginal odor ? ? ? ? ?Griffin Basil, MD ?Faculty Attending, [facility]  ?

## 2021-06-25 NOTE — Progress Notes (Signed)
Pt scheduled on nurse visit schedule s/p having an abdominal myomectomy on 06/18/21.  Pt advised to be seen by Dr. Elgie Congo as he did her surgery.  Pt agreed to come back at 1315 to be seen by Dr. Elgie Congo for wound check.  Pt stated thank you with no further questions/concerns.   ? ?Jesenia Spera,RN  ?06/25/21 ?

## 2021-06-26 ENCOUNTER — Other Ambulatory Visit: Payer: Self-pay

## 2021-06-26 ENCOUNTER — Ambulatory Visit (HOSPITAL_COMMUNITY): Payer: No Payment, Other | Admitting: Clinical

## 2021-06-26 ENCOUNTER — Encounter: Payer: Self-pay | Admitting: Obstetrics and Gynecology

## 2021-06-26 LAB — CERVICOVAGINAL ANCILLARY ONLY
Bacterial Vaginitis (gardnerella): POSITIVE — AB
Candida Glabrata: NEGATIVE
Candida Vaginitis: NEGATIVE
Chlamydia: NEGATIVE
Comment: NEGATIVE
Comment: NEGATIVE
Comment: NEGATIVE
Comment: NEGATIVE
Comment: NEGATIVE
Comment: NORMAL
Neisseria Gonorrhea: NEGATIVE
Trichomonas: NEGATIVE

## 2021-06-26 MED ORDER — METRONIDAZOLE 500 MG PO TABS
500.0000 mg | ORAL_TABLET | Freq: Two times a day (BID) | ORAL | 0 refills | Status: DC
  Filled 2021-06-26: qty 14, 7d supply, fill #0

## 2021-07-02 ENCOUNTER — Other Ambulatory Visit: Payer: Self-pay

## 2021-07-11 ENCOUNTER — Ambulatory Visit (INDEPENDENT_AMBULATORY_CARE_PROVIDER_SITE_OTHER): Payer: Self-pay

## 2021-07-11 ENCOUNTER — Other Ambulatory Visit: Payer: Self-pay

## 2021-07-11 VITALS — BP 136/83 | HR 93 | Wt 384.8 lb

## 2021-07-11 DIAGNOSIS — Z5189 Encounter for other specified aftercare: Secondary | ICD-10-CM

## 2021-07-11 NOTE — Progress Notes (Signed)
Pt here today wound check s/p approximately four weeks myomectomy.  Pt reports that she is having bleeding at the incision site.  Pt states that she placed cotton pads on the incision and would use hydrogen peroxide mixed with water to clean the area.  Observed incision- incision well approximated, no drainage, no redness, and edema.  Reviewed chart with Dr. Elgie Congo who applied silver nitrate to the areas.  Pt advised to continue to allow warm soapy water to run over incision and to pat dry. We will evaluate her incision on 07/25/21.  Pt encouraged to contact the office with any concerns.  Pt verbalized understanding with no further questions.  ? Judy Almond, RN  ?07/11/21 ?

## 2021-07-15 ENCOUNTER — Other Ambulatory Visit: Payer: Self-pay

## 2021-07-15 ENCOUNTER — Ambulatory Visit (INDEPENDENT_AMBULATORY_CARE_PROVIDER_SITE_OTHER): Payer: Self-pay | Admitting: General Practice

## 2021-07-15 VITALS — BP 130/89 | HR 86 | Ht 69.0 in | Wt 379.0 lb

## 2021-07-15 DIAGNOSIS — Z5189 Encounter for other specified aftercare: Secondary | ICD-10-CM

## 2021-07-15 NOTE — Progress Notes (Signed)
Patient presents to office today with concerns of incision coming open. She had a myomectomy on 2/22. Patient was in the office on Friday 3/17 for incision check and had silver nitrate applied to several areas. She states she noticed spots of blood when drying incision last night after shower. Patient states she had a friend look at her incision this morning and was told there were two open areas. Incision appears to be healing well. Two pin-head sized areas of incision show granulation tissue with serous fluid but incision is otherwise unremarkable. Reassurance provided to patient & wound care reviewed. Patient will follow up on 3/31 for post op check. ? ?Koren Bound RN BSN ?07/15/21 ? ?

## 2021-07-16 ENCOUNTER — Encounter: Payer: Self-pay | Admitting: *Deleted

## 2021-07-25 ENCOUNTER — Encounter: Payer: Self-pay | Admitting: Obstetrics and Gynecology

## 2021-07-25 ENCOUNTER — Ambulatory Visit (INDEPENDENT_AMBULATORY_CARE_PROVIDER_SITE_OTHER): Payer: Self-pay | Admitting: Obstetrics and Gynecology

## 2021-07-25 VITALS — BP 125/91 | HR 89 | Wt 378.9 lb

## 2021-07-25 DIAGNOSIS — Z4889 Encounter for other specified surgical aftercare: Secondary | ICD-10-CM

## 2021-07-25 NOTE — Progress Notes (Signed)
? ? ?  Subjective:  ?  ?Judy Hanna is a 36 y.o. female who presents to the clinic status post abdominal myomectomy on 06/18/21. The patient is not having any pain.  Eating a regular diet without difficulty. Bowel movements are normal. No other significant postoperative concerns. ? ?The following portions of the patient's history were reviewed and updated as appropriate: allergies, current medications, past family history, past medical history, past social history, past surgical history, and problem list..  Last pap smear was negative on 09/04/19. ? ?Review of Systems ?Pertinent items are noted in HPI. ?  ?Objective:  ? ?BP (!) 125/91   Pulse 89   Wt (!) 378 lb 14.4 oz (171.9 kg)   BMI 55.95 kg/m?  ?Constitutional:  Well-developed, well-nourished female in no acute distress.   ?Skin: Skin is warm and dry, no rash noted, not diaphoretic,no erythema, no pallor.  ?Cardiovascular: Normal heart rate noted  ?Respiratory: Effort and breath sounds normal, no problems with respiration noted  ?Abdomen: Soft, bowel sounds active, non-tender, no abnormal masses  ?Incision: Healing well, no drainage, no erythema, no hernia, no seroma, no swelling, no dehiscence, incision well approximated  ?Pelvic:   Deferred  ? ?Surgical pathology:  benign fibroids noted ?Assessment:  ? ?Doing well postoperatively.  Operative findings again reviewed. Pathology report discussed. ?  ?Plan:  ? ?1. Continue any current medications. ?2. Wound care discussed. ?3. Activity restrictions: none ?4. Anticipated return to work: now. ?5. Follow up as needed ?6.  Routine preventative health maintenance measures emphasized. ?7.  Pt advised to not attmept pregnancy for 12-18 months to allow for adequate healing. ?Please refer to After Visit Summary for other counseling recommendations.  ? ?F/u in 1 year or prn for annual exam/pap ?Lynnda Shields, MD, FACOG ?Attending [place] [facility] for [employer], [place]  ?

## 2021-08-05 ENCOUNTER — Telehealth (HOSPITAL_COMMUNITY): Payer: No Payment, Other | Admitting: Psychiatry

## 2021-08-05 ENCOUNTER — Encounter (HOSPITAL_COMMUNITY): Payer: Self-pay

## 2021-09-03 ENCOUNTER — Ambulatory Visit (HOSPITAL_COMMUNITY)
Admission: EM | Admit: 2021-09-03 | Discharge: 2021-09-03 | Disposition: A | Discharge status: Home / Self Care | Payer: Self-pay | Attending: Internal Medicine | Admitting: Internal Medicine

## 2021-09-03 ENCOUNTER — Encounter (HOSPITAL_COMMUNITY): Payer: Self-pay

## 2021-09-03 DIAGNOSIS — Z202 Contact with and (suspected) exposure to infections with a predominantly sexual mode of transmission: Secondary | ICD-10-CM

## 2021-09-03 DIAGNOSIS — Z113 Encounter for screening for infections with a predominantly sexual mode of transmission: Secondary | ICD-10-CM | POA: Insufficient documentation

## 2021-09-03 NOTE — Discharge Instructions (Signed)
Your routine STD testing is pending.  We will call if it is positive.  Please refrain from sexual activity until test results and treatment are complete. ?

## 2021-09-03 NOTE — ED Provider Notes (Signed)
?[facility] ? ? ? ?CSN: [id number] ?Arrival date & time: 09/03/21  Bosie Helper ? ? ?  ? ?History   ?Chief Complaint ?Chief Complaint  ?Patient presents with  ? S74.5  ? ? ?HPI ?Judy Hanna is a 36 y.o. female.  ? ?Patient presents for routine STD testing.  Patient would like blood work for HIV and syphilis as well.  Patient reports that she recently found a medication that her partner is taking.  When she confronted her sexual partner, partner reported that they were taking medications as "a preventative measure for HIV".  Patient has sexual intercourse with same-sex partner.  Has been having sexual relations for approximately 1 year.  Last sexual encounter was "a while ago" and was over 72 hours.  Patient reports that her sexual partner has not been confirmed for HIV that she knows of.  Patient denies any associated symptoms including flulike symptoms, vaginal discharge, abdominal pain, fever, abnormal vaginal bleeding, back pain. ? ? ? ?Past Medical History:  ?Diagnosis Date  ? BMI 50.0-59.9, adult ([place]) 06/26/2020  ? Fibroids   ? ? ?Patient Active Problem List  ? Diagnosis Date Noted  ? Visit for wound check 06/25/2021  ? Vaginal odor 06/25/2021  ? Fibroid uterus 06/18/2021  ? S/P myomectomy 06/18/2021  ? PTSD (post-traumatic stress disorder) 03/10/2021  ? Major depressive disorder, recurrent episode, moderate with anxious distress ([place]) 03/04/2021  ? BMI 50.0-59.9, adult ([place]) 06/26/2020  ? Tobacco abuse 06/26/2020  ? Fibroids 05/22/2020  ? Axillary abscess 05/22/2020  ? Essential hypertension 05/22/2020  ? Positive depression screening 06/05/2019  ? Morbid obesity ([place]) 06/05/2019  ? Tobacco dependence 06/05/2019  ? Menstrual migraine without status migrainosus, not intractable 06/05/2019  ? History of abnormal cervical Pap smear 06/05/2019  ? ? ?Past Surgical History:  ?Procedure Laterality Date  ? MYOMECTOMY N/A 06/18/2021  ? Procedure: ABDOMINAL MYOMECTOMY;  Surgeon: Griffin Basil, MD;  Location: [facility];  Service: Gynecology;  Laterality: N/A;  ? NO PAST SURGERIES    ? ? ?OB History   ? ? Gravida  ?3  ? Para  ?0  ? Term  ?0  ? Preterm  ?0  ? AB  ?3  ? Living  ?   ?  ? ? SAB  ?0  ? IAB  ?3  ? Ectopic  ?0  ? Multiple  ?   ? Live Births  ?   ?   ?  ?  ? ? ? ?Home Medications   ? ?Prior to Admission medications   ?Medication Sig Start Date End Date Taking? Authorizing Provider  ?acetaminophen (TYLENOL) 500 MG tablet Take 1,000 mg by mouth every 6 (six) hours as needed for fever or headache.    [provider]  ?albuterol (VENTOLIN HFA) 108 (90 Base) MCG/ACT inhaler Inhale 2 puffs into the lungs every 6 (six) hours as needed for wheezing or shortness of breath. 04/29/21   Ladell Pier, MD  ?cholecalciferol (VITAMIN D3) 25 MCG (1000 UNIT) tablet Take 1,000 Units by mouth daily.    [provider]  ?escitalopram (LEXAPRO) 10 MG tablet Take 1 tablet (10 mg total) by mouth daily. 05/13/21 05/13/22  Patrecia Pour, NP  ?hydrOXYzine (ATARAX) 10 MG tablet Take 1 tablet (10 mg total) by mouth 3 (three) times daily as needed (may take one tablet as needed twice daily for anxiety, and 3 tablets at bedtime for sleep as needed). 05/13/21   Patrecia Pour, NP  ?ibuprofen (ADVIL) 600  MG tablet Take 1 tablet (600 mg total) by mouth every 6 (six) hours. 06/20/21   Griffin Basil, MD  ?metroNIDAZOLE (FLAGYL) 500 MG tablet Take 1 tablet (500 mg total) by mouth 2 (two) times daily. 06/26/21   Griffin Basil, MD  ?Multiple Vitamins-Minerals (MULTIVITAMIN WITH MINERALS) tablet Take 1 tablet by mouth 3 (three) times a week.    [provider]  ?omeprazole (PRILOSEC) 20 MG capsule Take 1 capsule (20 mg total) by mouth 2 (two) times daily before a meal. 01/13/21   Varney Biles, MD  ?ondansetron (ZOFRAN) 4 MG tablet Take 1 tablet (4 mg total) by mouth every 6 (six) hours as needed for nausea. 06/20/21   Griffin Basil, MD  ?simethicone (MYLICON) 80 MG chewable tablet Chew 1 tablet (80 mg total) by mouth 4  (four) times daily as needed for flatulence. 06/20/21   Griffin Basil, MD  ?SUMAtriptan (IMITREX) 50 MG tablet Take 1 tablet at the start of the headache.  May repeat in 2 hours if headache does not resolve.  Max 2 hours / 24-hour. 02/10/21   Gildardo Pounds, NP  ?vitamin C (ASCORBIC ACID) 500 MG tablet Take 500 mg by mouth daily.    [provider]  ?zinc gluconate 50 MG tablet Take 50 mg by mouth daily.    [provider]  ? ? ?Family History ?Family History  ?Problem Relation Age of Onset  ? Hypertension Mother   ? Hypertension Sister   ? Hypertension Maternal Grandmother   ? ? ?Social History ?Social History  ? ?Tobacco Use  ? Smoking status: Every Day  ?  Packs/day: 1.00  ?  Years: 3.00  ?  Pack years: 3.00  ?  Types: Cigarettes  ? Smokeless tobacco: Never  ?Vaping Use  ? Vaping Use: Never used  ?Substance Use Topics  ? Alcohol use: Yes  ?  Comment: occisonally   ? Drug use: Not Currently  ?  Types: Marijuana  ? ? ? ?Allergies   ?Patient has no known allergies. ? ? ?Review of Systems ?Review of Systems ?Per HPI ? ?Physical Exam ?Triage Vital Signs ?ED Triage Vitals [09/03/21 1934]  ?Enc Vitals Group  ?   BP (!) 149/90  ?   Pulse Rate 88  ?   Resp 18  ?   Temp 98.2 ?F (36.8 ?C)  ?   Temp Source Oral  ?   SpO2 97 %  ?   Weight   ?   Height   ?   Head Circumference   ?   Peak Flow   ?   Pain Score 0  ?   Pain Loc   ?   Pain Edu?   ?   Excl. in [place]?   ? ?No data found. ? ?Updated Vital Signs ?BP (!) 149/90 (BP Location: Right Arm)   Pulse 88   Temp 98.2 ?F (36.8 ?C) (Oral)   Resp 18   SpO2 97%  ? ?Visual Acuity ?Right Eye Distance:   ?Left Eye Distance:   ?Bilateral Distance:   ? ?Right Eye Near:   ?Left Eye Near:    ?Bilateral Near:    ? ?Physical Exam ?Constitutional:   ?   General: She is not in acute distress. ?   Appearance: Normal appearance. She is not toxic-appearing or diaphoretic.  ?HENT:  ?   Head: Normocephalic and atraumatic.  ?Eyes:  ?   Extraocular Movements: Extraocular  movements intact.  ?  Conjunctiva/sclera: Conjunctivae normal.  ?Pulmonary:  ?   Effort: Pulmonary effort is normal.  ?Genitourinary: ?   Comments: Deferred with shared decision making.  Self swab performed. ?Neurological:  ?   General: No focal deficit present.  ?   Mental Status: She is alert and oriented to person, place, and time. Mental status is at baseline.  ?Psychiatric:     ?   Mood and Affect: Mood normal.     ?   Behavior: Behavior normal.     ?   Thought Content: Thought content normal.     ?   Judgment: Judgment normal.  ? ? ? ?UC Treatments / Results  ?Labs ?(all labs ordered are listed, but only abnormal results are displayed) ?Labs Reviewed  ?RPR  ?HIV ANTIBODY (ROUTINE TESTING W REFLEX)  ?CERVICOVAGINAL ANCILLARY ONLY  ? ? ?EKG ? ? ?Radiology ?No results found. ? ?Procedures ?Procedures (including critical care time) ? ?Medications Ordered in UC ?Medications - No data to display ? ?Initial Impression / Assessment and Plan / UC Course  ?I have reviewed the triage vital signs and the nursing notes. ? ?Pertinent labs & imaging results that were available during my care of the patient were reviewed by me and considered in my medical decision making (see chart for details). ? ?  ? ?Routine STD testing including HIV and syphilis pending.  No obvious exposure and no confirmed exposure to HIV as patient reported that her sexual partner was taking medications for prevention of HIV.  Will await results for any further treatment at this time as I do not think that postexposure prophylaxis is necessary given no confirmed HIV exposure at this time.  Patient was given infectious disease contact information if she has any questions or concerns and to follow-up if necessary.  Discussed return precautions.  Patient advised to refrain from sexual activity until test results and treatment are complete.  Patient verbalized understanding and was agreeable with plan. ?Final Clinical Impressions(s) / UC Diagnoses   ? ?Final diagnoses:  ?Screening examination for venereal disease  ? ? ? ?Discharge Instructions   ? ?  ?Your routine STD testing is pending.  We will call if it is positive.  Please refrain from sexual activity until te

## 2021-09-03 NOTE — ED Triage Notes (Signed)
Pt requesting full std panel including blood work noting that she found a med that her partner is taking. Pt reports that her partner told her the med was preventive. Denies any STD related sxs.  ?

## 2021-09-04 ENCOUNTER — Ambulatory Visit (INDEPENDENT_AMBULATORY_CARE_PROVIDER_SITE_OTHER): Payer: No Payment, Other | Admitting: Clinical

## 2021-09-04 DIAGNOSIS — F331 Major depressive disorder, recurrent, moderate: Secondary | ICD-10-CM | POA: Diagnosis not present

## 2021-09-04 LAB — CERVICOVAGINAL ANCILLARY ONLY
Bacterial Vaginitis (gardnerella): POSITIVE — AB
Candida Glabrata: NEGATIVE
Candida Vaginitis: NEGATIVE
Chlamydia: NEGATIVE
Comment: NEGATIVE
Comment: NEGATIVE
Comment: NEGATIVE
Comment: NEGATIVE
Comment: NEGATIVE
Comment: NORMAL
Neisseria Gonorrhea: NEGATIVE
Trichomonas: NEGATIVE

## 2021-09-04 LAB — RPR: RPR Ser Ql: NONREACTIVE

## 2021-09-04 LAB — HIV ANTIBODY (ROUTINE TESTING W REFLEX): HIV Screen 4th Generation wRfx: NONREACTIVE

## 2021-09-04 NOTE — Progress Notes (Signed)
? ?THERAPIST PROGRESS NOTE ?Virtual Visit via Video Note ? ?I connected with Judy Hanna on 09/04/21 at 10:00 AM EDT by a video enabled telemedicine application and verified that I am speaking with the correct person using two identifiers. ? ?Location: ?Patient: home ?Provider: office ?  ?I discussed the limitations of evaluation and management by telemedicine and the availability of in person appointments. The patient expressed understanding and agreed to proceed. ? ? ?Follow Up Instructions: ?I discussed the assessment and treatment plan with the patient. The patient was provided an opportunity to ask questions and all were answered. The patient agreed with the plan and demonstrated an understanding of the instructions. ?  ?The patient was advised to call back or seek an in-person evaluation if the symptoms worsen or if the condition fails to improve as anticipated. ? ? ?Session Time: 35 minutes ? ?Participation Level: Active ? ?Behavioral Response: CasualAlertAnxious ? ?Type of Therapy: Individual Therapy ? ?Treatment Goals addressed: will identify 3 cognitive patterns and beliefs that support depression ? ?ProgressTowards Goals: Progressing ? ?Interventions: CBT and Supportive ? ?Summary:  ?Judy Hanna is a 36 y.o. female who presents for the scheduled session oriented x5, appropriately dressed, and friendly.  Client denies hallucinations and delusions. ?Client reported on today things have been "rough" but she is working through it. Client reported being in a on and off relationship with an older woman for the past 2 years. Client reported over time she has had concern about a medication she found in her belongings which she looked up and is used for HIV. Client reported she has asked her partner about the medication but has been told stories that seem odd. Client reported her ex partner told her she will move out since she does not believe her. Client reported she presented to the ED yesterday to be  screened for HIV because she feels exposed. Client reported otherwise she had surgery earlier this year to have fibroids removed. Client reported the surgery went well and she is now back at work. Client reported she wants to have children one day. 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 ?Evidence of progress towards goal:  client reported she has not been compliant with her medications 7 days out of the week. Client reported she can tell a shift in her mood being easily triggered by things such as unexplained irritability.  ? ? ? ?Suicidal/Homicidal: Nowithout intent/plan ? ?Therapist Response:  ?Therapist began the appointment asking the client how she has been doing since she was last seen. ?Therapist used CBT to engage using active listening and positive support towards her thoughts and feelings. ?Therapist used CBT to engage and ask the client about the source of her onset of anxiety. ?Therapist used CBT to engage with the client to normalize emotional reaction and rationalize her thought process to problem solve the situation. ?Therapist used CBT to discuss positive improvement she has made. ?Therapist used CBT to ask the client about medication compliance. ?Therapist used CBT ask the client to identify her progress with frequency of use with coping skills with continued practice in her daily activity.    ?Therapist assigned the client homework tp  ? ? ? ? ? ?Plan: Return again in 5 weeks. ? ?Diagnosis: major depressive disorder, recurrent episode, moderate with anxious distress ? ?Collaboration of Care: Patient refused AEB client reported she would like for admin staff to contact her to reschedule a medication management appointment with her psychiatrist. ? ?Patient/Guardian was  advised Release of Information must be obtained prior to any record release in order to collaborate their care with an outside provider. Patient/Guardian was advised if they have not already done so to  contact the registration department to sign all necessary forms in order for [place] to release information regarding their care.  ? ?Consent: Patient/Guardian gives verbal consent for treatment and assignment of benefits for services provided during this visit. Patient/Guardian expressed understanding and agreed to proceed.  ? ?Birdena Jubilee Imad Shostak, LCSW ?09/04/2021 ? ?

## 2021-09-05 ENCOUNTER — Encounter (HOSPITAL_COMMUNITY): Payer: Self-pay

## 2021-09-05 ENCOUNTER — Telehealth (HOSPITAL_COMMUNITY): Payer: Self-pay | Admitting: Emergency Medicine

## 2021-09-05 ENCOUNTER — Other Ambulatory Visit: Payer: Self-pay

## 2021-09-05 MED ORDER — METRONIDAZOLE 500 MG PO TABS
500.0000 mg | ORAL_TABLET | Freq: Two times a day (BID) | ORAL | 0 refills | Status: DC
  Filled 2021-09-05: qty 14, 7d supply, fill #0

## 2021-09-05 NOTE — Plan of Care (Signed)
?  Problem: Depression CCP Problem  1  Goal: LTG: Adalay WILL SCORE LESS THAN 10 ON THE PATIENT HEALTH QUESTIONNAIRE (PHQ-9) Outcome: Progressing Goal: STG: Shayden WILL IDENTIFY 3 COGNITIVE PATTERNS AND BELIEFS THAT SUPPORT DEPRESSION Outcome: Progressing   

## 2021-09-09 ENCOUNTER — Ambulatory Visit: Payer: Self-pay

## 2021-09-11 ENCOUNTER — Other Ambulatory Visit: Payer: Self-pay

## 2021-09-24 ENCOUNTER — Ambulatory Visit: Payer: Self-pay | Admitting: Physician Assistant

## 2021-11-18 ENCOUNTER — Ambulatory Visit (INDEPENDENT_AMBULATORY_CARE_PROVIDER_SITE_OTHER): Payer: No Payment, Other | Admitting: Clinical

## 2021-11-18 DIAGNOSIS — F331 Major depressive disorder, recurrent, moderate: Secondary | ICD-10-CM | POA: Diagnosis not present

## 2021-11-18 NOTE — Progress Notes (Signed)
THERAPIST PROGRESS NOTE Virtual Visit via Video Note  I connected with Judy Hanna on 11/18/2021 at 11:00 AM EDT by a video enabled telemedicine application and verified that I am speaking with the correct person using two identifiers.  Location: Patient: home Provider: office   I discussed the limitations of evaluation and management by telemedicine and the availability of in person appointments. The patient expressed understanding and agreed to proceed.   Follow Up Instructions: I discussed the assessment and treatment plan with the patient. The patient was provided an opportunity to ask questions and all were answered. The patient agreed with the plan and demonstrated an understanding of the instructions.   The patient was advised to call back or seek an in-person evaluation if the symptoms worsen or if the condition fails to improve as anticipated.   Session Time: 35 minutes  Participation Level: Active  Behavioral Response: CasualAlertDepressed  Type of Therapy: Individual Therapy  Treatment Goals addressed: client will identify 3 cognitive patterns and beliefs that support depression  ProgressTowards Goals: Progressing  Interventions: CBT  Summary:  Judy Hanna is a 36 y.o. female who presents for the scheduled session oriented times five, appropriately dressed, and friendly. Client denied hallucinations and delusions. Client reported on today she has been maintaining fairly well but dealing with depression and grief. Client reported in July her brother was shot and killed by the police. Client reported her brother stayed with her for awhile before he moved back to [place] earlier this year. Client reported her brother has a history of schizophrenia and substance abuse. Client reported they have had press conferences because it made the news in [place]. Client reported her biological parents have appeared and have been rude to her. Client reported her parents have left out of  taking part in funeral arrangements. Client reported the situation has caused her anger but she is not going to lash out.  Evidence of progress towards goal:  client reported using 2 coping skills of staying engaged with enjoyed activities and using positive family support that she has. Client reported 1 negative pattern/ belief of abandonment stems from her childhood experience with her parents.    Suicidal/Homicidal: Nowithout intent/plan  Therapist Response:  Therapist began the appointment asking the client how she has been doing since last seen. Therapist used CBT to engage using active listening and positive emotional support. Therapist used CBT to engage and ask the client to describe the source of her depressed thoughts and feelings. Therapist used CBT to have the client identify how she is processing the traumatic event of family death. Therapist used CBT to engage and normalize the clients emotions. Therapist used CBT ask the client to identify her progress with frequency of use with coping skills with continued practice in her daily activity.    Therapist assigned the client homework to practice self care.    Plan: Return again in 5 weeks.  Diagnosis: major depressive disorder, recurrent episode, moderate with anxious distress  Collaboration of Care: Patient refused AEB none requested  Patient/Guardian was advised Release of Information must be obtained prior to any record release in order to collaborate their care with an outside provider. Patient/Guardian was advised if they have not already done so to contact the registration department to sign all necessary forms in order for [place] to release information regarding their care.   Consent: Patient/Guardian gives verbal consent for treatment and assignment of benefits for services provided during this visit. Patient/Guardian expressed understanding and agreed to  proceed.   [place], LCSW 11/18/2021

## 2021-11-22 NOTE — Plan of Care (Signed)
  Problem: Depression CCP Problem  1  Goal: STG: Judy Hanna WILL IDENTIFY 3 COGNITIVE PATTERNS AND BELIEFS THAT SUPPORT DEPRESSION Outcome: Progressing   Problem: Depression CCP Problem  1  Goal: LTG: Judy Hanna WILL SCORE LESS THAN 10 ON THE PATIENT HEALTH QUESTIONNAIRE (PHQ-9) Outcome: Not Progressing

## 2021-12-10 ENCOUNTER — Other Ambulatory Visit: Payer: Self-pay

## 2021-12-10 ENCOUNTER — Ambulatory Visit (INDEPENDENT_AMBULATORY_CARE_PROVIDER_SITE_OTHER): Payer: Self-pay

## 2021-12-10 ENCOUNTER — Other Ambulatory Visit (HOSPITAL_COMMUNITY)
Admission: RE | Admit: 2021-12-10 | Discharge: 2021-12-10 | Disposition: A | Discharge status: Home / Self Care | Payer: Self-pay | Source: Ambulatory Visit | Attending: Family Medicine | Admitting: Family Medicine

## 2021-12-10 ENCOUNTER — Ambulatory Visit: Payer: Self-pay | Admitting: *Deleted

## 2021-12-10 ENCOUNTER — Ambulatory Visit: Payer: Self-pay

## 2021-12-10 VITALS — BP 133/95 | HR 91 | Wt 383.1 lb

## 2021-12-10 DIAGNOSIS — N898 Other specified noninflammatory disorders of vagina: Secondary | ICD-10-CM

## 2021-12-10 DIAGNOSIS — Z113 Encounter for screening for infections with a predominantly sexual mode of transmission: Secondary | ICD-10-CM | POA: Insufficient documentation

## 2021-12-10 DIAGNOSIS — L732 Hidradenitis suppurativa: Secondary | ICD-10-CM

## 2021-12-10 MED ORDER — TRICLOSAN 0.3 % EX LIQD
1.0000 | Freq: Every day | CUTANEOUS | 3 refills | Status: DC
  Filled 2021-12-10 – 2022-03-23 (×3): qty 355, fill #0

## 2021-12-10 MED ORDER — CLINDAMYCIN PHOSPHATE 1 % EX GEL
Freq: Two times a day (BID) | CUTANEOUS | 0 refills | Status: DC
  Filled 2021-12-10: qty 30, 30d supply, fill #0

## 2021-12-10 NOTE — Telephone Encounter (Signed)
  Chief Complaint: Abdominal pain Symptoms: Occasional pain "Right above belly button or at panty line" Does not occur daily. States "Helps to rub it out." Duration of few seconds, one minute, 10/10 when occurs.States had myomectomy 2/22. "Had this pain before that." Frequency: 1 month ago Pertinent Negatives: Patient denies fever, nausea, vomiting, diarrhea, dysuria Disposition: '[]'$ ED /'[]'$ Urgent Care (no appt availability in office) / '[]'$ Appointment(In office/virtual)/ '[]'$  [facility]/ '[]'$ Home Care/ '[]'$ Refused Recommended Disposition /'[]'$ [facility]/ '[x]'$  Follow-up with PCP Additional Notes: No availability within protocol time frame. Please advise. Care advise provided, advised ED for worsening symptoms.  Reason for Disposition  Abdominal pain is a chronic symptom (recurrent or ongoing AND present > 4 weeks)  Answer Assessment - Initial Assessment Questions 1. LOCATION: "Where does it hurt?"      "By panty line or right above belly button." 2. RADIATION: "Does the pain shoot anywhere else?" (e.g., chest, back)     No 3. ONSET: "When did the pain begin?" (e.g., minutes, hours or days ago)      1 month ago 4. SUDDEN: "Gradual or sudden onset?"     gradual 5. PATTERN "Does the pain come and go, or is it constant?"    - If it comes and goes: "How long does it last?" "Do you have pain now?"     (Note: Comes and goes means the pain is intermittent. It goes away completely between bouts.)    - If constant: "Is it getting better, staying the same, or getting worse?"      (Note: Constant means the pain never goes away completely; most serious pain is constant and gets worse.)      Comes and goes, not daily 6. SEVERITY: "How bad is the pain?"  (e.g., Scale 1-10; mild, moderate, or severe)    - MILD (1-3): Doesn't interfere with normal activities, abdomen soft and not tender to touch.     - MODERATE (4-7): Interferes with normal activities or awakens from sleep, abdomen tender to  touch.     - SEVERE (8-10): Excruciating pain, doubled over, unable to do any normal activities.       10/10 lasts only a few second or a minute 7. RECURRENT SYMPTOM: "Have you ever had this type of stomach pain before?" If Yes, ask: "When was the last time?" and "What happened that time?"      yes 8. CAUSE: "What do you think is causing the stomach pain?"      9. RELIEVING/AGGRAVATING FACTORS: "What makes it better or worse?" (e.g., antacids, bending or twisting motion, bowel movement)     Helps to "Rub it out." 10. OTHER SYMPTOMS: "Do you have any other symptoms?" (e.g., back pain, diarrhea, fever, urination pain, vomiting)       No  Protocols used: Abdominal Pain - Female-A-AH

## 2021-12-10 NOTE — Addendum Note (Signed)
Addended by: Gaylan Gerold on: 12/10/2021 10:37 PM   Modules accepted: Orders

## 2021-12-10 NOTE — Progress Notes (Signed)
Patient here for STD screening. Pt reports it has been 3 months since sexual contact with female partner who is HIV positive. Pt desires full STD screening. Reports abnormal vaginal odor. Pt also reports complaint of bumps in groin, inner thigh, and labia. Reports it has been 1 month since shaving or other form of hair removal. Gaylan Gerold, CNM to bedside for exam. Provider states this appears to be Hidradenitis suppurativa. Recommends clindamycin wash and topical ointment.   Vaginal self swab collected and labs drawn prior to check out. Explained we will contact patient with any abnormal results.  Apolonio Schneiders RN 12/10/21

## 2021-12-11 ENCOUNTER — Other Ambulatory Visit: Payer: Self-pay | Admitting: Internal Medicine

## 2021-12-11 ENCOUNTER — Telehealth: Payer: Self-pay | Admitting: Internal Medicine

## 2021-12-11 ENCOUNTER — Other Ambulatory Visit: Payer: Self-pay | Admitting: Emergency Medicine

## 2021-12-11 ENCOUNTER — Emergency Department (HOSPITAL_COMMUNITY)
Admission: EM | Admit: 2021-12-11 | Discharge: 2021-12-11 | Disposition: A | Discharge status: Home / Self Care | Payer: Self-pay | Attending: Emergency Medicine | Admitting: Emergency Medicine

## 2021-12-11 ENCOUNTER — Encounter (HOSPITAL_COMMUNITY): Payer: Self-pay

## 2021-12-11 ENCOUNTER — Other Ambulatory Visit: Payer: Self-pay

## 2021-12-11 DIAGNOSIS — M79604 Pain in right leg: Secondary | ICD-10-CM | POA: Insufficient documentation

## 2021-12-11 DIAGNOSIS — M79651 Pain in right thigh: Secondary | ICD-10-CM | POA: Insufficient documentation

## 2021-12-11 DIAGNOSIS — M542 Cervicalgia: Secondary | ICD-10-CM | POA: Insufficient documentation

## 2021-12-11 DIAGNOSIS — R2241 Localized swelling, mass and lump, right lower limb: Secondary | ICD-10-CM | POA: Insufficient documentation

## 2021-12-11 LAB — HEPATITIS C ANTIBODY: Hep C Virus Ab: NONREACTIVE

## 2021-12-11 LAB — BASIC METABOLIC PANEL
Anion gap: 7 (ref 5–15)
BUN: 12 mg/dL (ref 6–20)
CO2: 25 mmol/L (ref 22–32)
Calcium: 8.8 mg/dL — ABNORMAL LOW (ref 8.9–10.3)
Chloride: 106 mmol/L (ref 98–111)
Creatinine, Ser: 1.01 mg/dL — ABNORMAL HIGH (ref 0.44–1.00)
GFR, Estimated: >60 mL/min (ref >60)
Glucose, Bld: 104 mg/dL — ABNORMAL HIGH (ref 70–99)
Potassium: 3.6 mmol/L (ref 3.5–5.1)
Sodium: 138 mmol/L (ref 135–145)

## 2021-12-11 LAB — CBC WITH DIFFERENTIAL/PLATELET
Abs Immature Granulocytes: 0.01 10*3/uL (ref 0.00–0.07)
Basophils Absolute: 0 10*3/uL (ref 0.0–0.1)
Basophils Relative: 0 %
Eosinophils Absolute: 0.5 10*3/uL (ref 0.0–0.5)
Eosinophils Relative: 5 %
HCT: 38.9 % (ref 36.0–46.0)
Hemoglobin: 13.5 g/dL (ref 12.0–15.0)
Immature Granulocytes: 0 %
Lymphocytes Relative: 29 %
Lymphs Abs: 3 10*3/uL (ref 0.7–4.0)
MCH: 26.8 pg (ref 26.0–34.0)
MCHC: 34.7 g/dL (ref 30.0–36.0)
MCV: 77.2 fL — ABNORMAL LOW (ref 80.0–100.0)
Monocytes Absolute: 0.6 10*3/uL (ref 0.1–1.0)
Monocytes Relative: 6 %
Neutro Abs: 6.3 10*3/uL (ref 1.7–7.7)
Neutrophils Relative %: 60 %
Platelets: 349 10*3/uL (ref 150–400)
RBC: 5.04 MIL/uL (ref 3.87–5.11)
RDW: 15.7 % — ABNORMAL HIGH (ref 11.5–15.5)
WBC: 10.4 10*3/uL (ref 4.0–10.5)
nRBC: 0 % (ref 0.0–0.2)

## 2021-12-11 LAB — RPR: RPR Ser Ql: NONREACTIVE

## 2021-12-11 LAB — CERVICOVAGINAL ANCILLARY ONLY
Chlamydia: NEGATIVE
Comment: NEGATIVE
Comment: NEGATIVE
Comment: NORMAL
Neisseria Gonorrhea: NEGATIVE
Trichomonas: NEGATIVE

## 2021-12-11 LAB — HEPATITIS B SURFACE ANTIGEN: Hepatitis B Surface Ag: NEGATIVE

## 2021-12-11 LAB — HIV ANTIBODY (ROUTINE TESTING W REFLEX): HIV Screen 4th Generation wRfx: NONREACTIVE

## 2021-12-11 MED ORDER — APIXABAN 5 MG PO TABS
10.0000 mg | ORAL_TABLET | Freq: Once | ORAL | Status: AC
  Administered 2021-12-11: 10 mg via ORAL
  Filled 2021-12-11: qty 2

## 2021-12-11 MED ORDER — VITAMIN C 500 MG PO TABS
500.0000 mg | ORAL_TABLET | Freq: Every day | ORAL | 2 refills | Status: AC
  Filled 2021-12-11 – 2022-03-23 (×2): qty 90, 90d supply, fill #0

## 2021-12-11 MED ORDER — CYCLOBENZAPRINE HCL 10 MG PO TABS
10.0000 mg | ORAL_TABLET | Freq: Once | ORAL | Status: AC
  Administered 2021-12-11: 10 mg via ORAL
  Filled 2021-12-11: qty 1

## 2021-12-11 MED ORDER — ACETAMINOPHEN 500 MG PO TABS
1000.0000 mg | ORAL_TABLET | Freq: Four times a day (QID) | ORAL | 1 refills | Status: DC | PRN
  Filled 2021-12-11: qty 100, 13d supply, fill #0

## 2021-12-11 MED ORDER — CYCLOBENZAPRINE HCL 10 MG PO TABS
10.0000 mg | ORAL_TABLET | Freq: Two times a day (BID) | ORAL | 0 refills | Status: DC | PRN
  Filled 2021-12-11: qty 20, 10d supply, fill #0

## 2021-12-11 NOTE — ED Triage Notes (Signed)
Pt c/o pain in right thigh since last night and she woke up this morning with some neck pain and swelling in bilateral ankles. Denies injury. Reports working a 14 hour shift last night.

## 2021-12-11 NOTE — ED Provider Triage Note (Signed)
Emergency Medicine Provider Triage Evaluation Note  Judy Hanna , a 36 y.o. female  was evaluated in triage.  Pt complains of right leg pain, on the back part of her thigh since yesterday.  Sudden onset, started at the end of work.  No direct injury.  Woke up this morning with some neck soreness and some mild pain and swelling in the ankles.  Often works 14-hour shifts.  Has never had pain like this before.  Denies fevers, chills, nausea, vomiting, or diarrhea.  Still able to walk, though states it is somewhat painful.  Review of Systems  Positive:  Negative: See above  Physical Exam  BP (!) 150/118 (BP Location: Left Arm)   Pulse 88   Temp 98.3 F (36.8 C) (Oral)   Resp 18   Ht '5\' 9"'$  (1.753 m)   Wt (!) 173.7 kg   SpO2 95%   BMI 56.56 kg/m  Gen:   Awake, no distress   Resp:  Normal effort  MSK:   Moves extremities without difficulty  Other:  Right posterior thigh mildly tender to palpation.  Without significant swelling or tenderness to the ankles.  Abdomen soft, nontender.  Medical Decision Making  Medically screening exam initiated at 5:02 PM.  Appropriate orders placed.  Briseidy Spark was informed that the remainder of the evaluation will be completed by another provider, this initial triage assessment does not replace that evaluation, and the importance of remaining in the ED until their evaluation is complete.     Prince [place], PA-C 22/63/33 [id number]

## 2021-12-11 NOTE — ED Notes (Signed)
Pt A&OX4 ambulatory at d/c with independent steady gait. NAD. Pt verbalized understanding of d/c instructions, prescriptions and follow up care.

## 2021-12-11 NOTE — Telephone Encounter (Signed)
Copied from [place] [id number]. Topic: General - Other >> Dec 11, 2021 10:58 AM Cyndi Bender wrote: Reason for CRM: Pt stated she spoke with a nurse earlier that offered an appt at [facility] at 2:50 but she declined but now she changed her mind. However the appt is no longer available and pt request return call from that nurse. Cb# [phone]

## 2021-12-11 NOTE — Discharge Instructions (Signed)
You have been seen here for right thigh pain I recommend taking over-the-counter pain medications like ibuprofen and/or Tylenol every 6 as needed.  Please follow dosage and on the back of bottle.  I also recommend applying heat to the area and stretching out the muscles as this will help decrease stiffness and pain.  I have given you information on exercises please follow.  I have given you Flexeril please take as prescribed  I have ordered a DVT study, please come back tomorrow for your scan.  I have given you a single dose of a blood thinner, this increases your chance of bleeding, if you have any sort of trauma you must come back in for reassessment, this will last for approximate 24 hours.  Come back to the emergency department if you develop chest pain, shortness of breath, severe abdominal pain, uncontrolled nausea, vomiting, diarrhea.

## 2021-12-11 NOTE — Telephone Encounter (Signed)
Patient states she forgot to mention he pain at the GYN office yesterday. She also refers to pain in the back of her legs. She stated she was going to go to the ED because she has additional pain in the back of her leg.   Advised patient of an opening today at [facility] at 2:50. Patient declined appt.

## 2021-12-11 NOTE — ED Provider Notes (Signed)
[facility] DEPT Provider Note   CSN: [id number] Arrival date & time: 12/11/21  1529     History  Chief Complaint  Patient presents with   Neck Pain   Leg Pain   Joint Swelling    Judy Hanna is a 36 y.o. female.  HPI   Patient without medical history presents with complaints of right posterior thigh pain, started this morning,, came on suddenly, pain is constant feels like a throbbing-like sensation, will radiate down to her leg, no exacerbating or alleviating factors, denies any recent trauma to the area, never had this in the past.  She denies any back pain or lower abdominal pain, she has had no recent surgeries, no long immobilizations, she has never had a PE or DVT denies any chest pain.  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    Home Medications Prior to Admission medications   Medication Sig Start Date End Date Taking? Authorizing Provider  cyclobenzaprine (FLEXERIL) 10 MG tablet Take 1 tablet (10 mg total) by mouth 2 (two) times daily as needed for muscle spasms. 12/11/21  Yes Marcello Fennel, PA-C  acetaminophen (TYLENOL) 500 MG tablet Take 2 tablets (1,000 mg total) by mouth every 6 (six) hours as needed for fever or headache. 12/11/21   Elsie Stain, MD  albuterol (VENTOLIN HFA) 108 (90 Base) MCG/ACT inhaler Inhale 2 puffs into the lungs every 6 (six) hours as needed for wheezing or shortness of breath. 04/29/21   Ladell Pier, MD  ascorbic acid (VITAMIN C) 500 MG tablet Take 1 tablet (500 mg total) by mouth daily. 12/11/21   Elsie Stain, MD  cholecalciferol (VITAMIN D3) 25 MCG (1000 UNIT) tablet Take 1,000 Units by mouth daily.    [provider]  clindamycin (CLINDAGEL) 1 % gel Apply to affected area 2 (two) times daily. 12/10/21   Gabriel Carina, CNM  escitalopram (LEXAPRO) 10 MG tablet Take 1 tablet (10 mg total) by mouth daily. 05/13/21 05/13/22  Patrecia Pour, NP  hydrOXYzine  (ATARAX) 10 MG tablet Take 1 tablet (10 mg total) by mouth 3 (three) times daily as needed (may take one tablet as needed twice daily for anxiety, and 3 tablets at bedtime for sleep as needed). 05/13/21   Patrecia Pour, NP  ibuprofen (ADVIL) 600 MG tablet Take 1 tablet (600 mg total) by mouth every 6 (six) hours. 06/20/21   Griffin Basil, MD  Multiple Vitamins-Minerals (MULTIVITAMIN WITH MINERALS) tablet Take 1 tablet by mouth 3 (three) times a week.    [provider]  omeprazole (PRILOSEC) 20 MG capsule Take 1 capsule (20 mg total) by mouth 2 (two) times daily before a meal. 01/13/21   Varney Biles, MD  ondansetron (ZOFRAN) 4 MG tablet Take 1 tablet (4 mg total) by mouth every 6 (six) hours as needed for nausea. 06/20/21   Griffin Basil, MD  simethicone (MYLICON) 80 MG chewable tablet Chew 1 tablet (80 mg total) by mouth 4 (four) times daily as needed for flatulence. 06/20/21   Griffin Basil, MD  SUMAtriptan (IMITREX) 50 MG tablet Take 1 tablet at the start of the headache.  May repeat in 2 hours if headache does not resolve.  Max 2 hours / 24-hour. 02/10/21   Gildardo Pounds, NP  Triclosan 0.3 % LIQD Apply liquid topically daily. Use in the shower, follow with clindamycin gel after showering before applying lotion 12/10/21  Gaylan Gerold R, CNM  zinc gluconate 50 MG tablet Take 50 mg by mouth daily.    [provider]      Allergies    Patient has no known allergies.    Review of Systems   Review of Systems  Constitutional:  Negative for chills and fever.  Respiratory:  Negative for shortness of breath.   Cardiovascular:  Negative for chest pain.  Gastrointestinal:  Negative for abdominal pain.  Neurological:  Negative for headaches.    Physical Exam Updated Vital Signs BP (!) 140/82   Pulse 79   Temp 97.9 F (36.6 C) (Oral)   Resp 17   Ht '5\' 9"'$  (1.753 m)   Wt (!) 173.7 kg   SpO2 100%   BMI 56.56 kg/m  Physical Exam Vitals and nursing note  reviewed.  Constitutional:      General: She is not in acute distress.    Appearance: She is not ill-appearing.  HENT:     Head: Normocephalic and atraumatic.     Nose: No congestion.  Eyes:     Conjunctiva/sclera: Conjunctivae normal.  Cardiovascular:     Rate and Rhythm: Normal rate and regular rhythm.     Pulses: Normal pulses.  Pulmonary:     Effort: Pulmonary effort is normal.  Abdominal:     Palpations: Abdomen is soft.     Tenderness: There is no abdominal tenderness. There is no right CVA tenderness or left CVA tenderness.  Musculoskeletal:     Comments: No leg edema present, no overlying skin changes, she had point tenderness on the distal one third of the right posterior thigh, pain is focalized reproducible, no palpable cords or mass, no calf tenderness, she has full range of motion 5-5 strength neurovascular tact in lower extremities 2+ dorsal pedal pulses.  Spine was palpated was nontender to palpation.  Skin:    General: Skin is warm and dry.  Neurological:     Mental Status: She is alert.  Psychiatric:        Mood and Affect: Mood normal.     ED Results / Procedures / Treatments   Labs (all labs ordered are listed, but only abnormal results are displayed) Labs Reviewed  BASIC METABOLIC PANEL - Abnormal; Notable for the following components:      Result Value   Glucose, Bld 104 (*)    Creatinine, Ser 1.01 (*)    Calcium 8.8 (*)    All other components within normal limits  CBC WITH DIFFERENTIAL/PLATELET - Abnormal; Notable for the following components:   MCV 77.2 (*)    RDW 15.7 (*)    All other components within normal limits    EKG None  Radiology No results found.  Procedures Procedures    Medications Ordered in ED Medications  apixaban (ELIQUIS) tablet 10 mg (has no administration in time range)  cyclobenzaprine (FLEXERIL) tablet 10 mg (has no administration in time range)    ED Course/ Medical Decision Making/ A&P                            Medical Decision Making Risk Prescription drug management.   This patient presents to the ED for concern of right leg pain, this involves an extensive number of treatment options, and is a complaint that carries with it a high risk of complications and morbidity.  The differential diagnosis includes VTE, compartment syndrome, fracture, dislocation    Additional history obtained:  Additional  history obtained from N/A External records from outside source obtained and reviewed including previous PCP notes   Co morbidities that complicate the patient evaluation  N/A  Social Determinants of Health:  N/A    Lab Tests:  I Ordered, and personally interpreted labs.  The pertinent results include: CBC unremarkable, BMP shows glucose of 104 creatinine 1.01 calcium 8.8   Imaging Studies ordered:  I ordered imaging studies including N/A I I independently visualized and interpreted imaging which showed N/A I agree with the radiologist interpretation   Cardiac Monitoring:  The patient was maintained on a cardiac monitor.  I personally viewed and interpreted the cardiac monitored which showed an underlying rhythm of: N/A   Medicines ordered and prescription drug management:  I ordered medication including apixaban, Flexeril I have reviewed the patients home medicines and have made adjustments as needed  Critical Interventions:  N/A   Reevaluation:  Presents with right thigh pain, triage obtain lab work which was unremarkable, she had a benign physical exam, she is agreement plan and discharge at this time.  Consultations Obtained:  N/A   Test Considered:  X-ray of right thigh-deferred as my suspicion for fracture dislocation low at this time no traumatic injuries, not increased risk for pathologic fracture.    Rule out I have low suspicion for septic arthritis as patient denies IV drug use, skin exam was performed no erythematous, edematous, warm joints noted  on exam, no new heart murmur heard on exam.  low suspicion for ligament or tendon damage as area was palpated no gross defects noted, they had full range of motion as well as 5/5 strength.  Low suspicion for compartment syndrome as area was palpated it was soft to the touch, neurovascular fully intact.     Dispostion and problem list  After consideration of the diagnostic results and the patients response to treatment, I feel that the patent would benefit from discharge.  Right leg pain-unclear etiology, I suspect this is likely muscular strain but she has some risk factors for DVT increased body habitus, went over risks and benefits of single dose of anticoag she would like to proceed with an anticoag, I find this reasonable, will have her come back tomorrow for a DVT study as we do not have this at this time.            Final Clinical Impression(s) / ED Diagnoses Final diagnoses:  Right leg pain    Rx / DC Orders ED Discharge Orders          Ordered    LE VENOUS        12/11/21 2309    cyclobenzaprine (FLEXERIL) 10 MG tablet  2 times daily PRN        12/11/21 2310              Marcello Fennel, PA-C 12/11/21 2312    Palumbo, April, MD 12/11/21 2317

## 2021-12-12 ENCOUNTER — Other Ambulatory Visit: Payer: Self-pay

## 2021-12-12 ENCOUNTER — Ambulatory Visit (HOSPITAL_COMMUNITY)
Admission: RE | Admit: 2021-12-12 | Discharge: 2021-12-12 | Disposition: A | Discharge status: Home / Self Care | Payer: Self-pay | Source: Ambulatory Visit | Attending: Student | Admitting: Student

## 2021-12-12 DIAGNOSIS — M79661 Pain in right lower leg: Secondary | ICD-10-CM | POA: Insufficient documentation

## 2021-12-12 DIAGNOSIS — R52 Pain, unspecified: Secondary | ICD-10-CM

## 2021-12-12 NOTE — Progress Notes (Signed)
Right LE venous duplex study completed. Please see CV Proc for preliminary results.  Amadea Keagy BS, RVT 12/12/2021 11:30 AM

## 2021-12-12 NOTE — Telephone Encounter (Signed)
Noted that patient had evaluation in ED.

## 2021-12-17 ENCOUNTER — Other Ambulatory Visit: Payer: Self-pay

## 2021-12-25 ENCOUNTER — Ambulatory Visit (INDEPENDENT_AMBULATORY_CARE_PROVIDER_SITE_OTHER): Payer: No Payment, Other | Admitting: Clinical

## 2021-12-25 DIAGNOSIS — F331 Major depressive disorder, recurrent, moderate: Secondary | ICD-10-CM | POA: Diagnosis not present

## 2021-12-25 NOTE — Progress Notes (Unsigned)
THERAPIST PROGRESS NOTE Virtual Visit via Video Note  I connected with Judy Hanna on 12/25/21 at 11:00 AM EDT by a video enabled telemedicine application and verified that I am speaking with the correct person using two identifiers.  Location: Patient: work Provider: office   I discussed the limitations of evaluation and management by telemedicine and the availability of in person appointments. The patient expressed understanding and agreed to proceed.   Follow Up Instructions: I discussed the assessment and treatment plan with the patient. The patient was provided an opportunity to ask questions and all were answered. The patient agreed with the plan and demonstrated an understanding of the instructions.   The patient was advised to call back or seek an in-person evaluation if the symptoms worsen or if the condition fails to improve as anticipated.   Session Time: 30 minutes  Participation Level: Active  Behavioral Response: CasualAlertDepressed  Type of Therapy: Individual Therapy  Treatment Goals addressed: Client will identify 3 cognitive patterns and beliefs that support depression  ProgressTowards Goals: Progressing  Interventions: CBT and Supportive  Summary:  Judy Hanna is a 36 y.o. female who presents for the scheduled appointment oriented times five, appropriately dressed, and friendly. Client denied hallucinations and delusions. Client reported she has been trying her best to maintain her mood. Client reported she stopped taking her psych medications because it was making her graugey and interfering with her work. Client reported she would like an appt scheduled to have her medications changed. Client reported otherwise she went back to [place] for brothers funeral services. Client reported her parents were there and the interaction did not go well. Client reported things did not go well with her father particularly and she does not know if that relationship can  be repaired. Client reported she has been having mood swings which she can feel coming on by body sensation first. Client reported she has episodes displayed by lack of empathy and saying things that she does not mean to say to other people. Client reported she has no sensitivity to other people. Evidence of progress towards goal:  client reported 1 symptoms that she is in the anger stage of the grieving process. Client reported 2 steps in her depression cycle that lead to negative behaviors.   Suicidal/Homicidal: Nowithout intent/plan  Therapist Response:  Therapist began the appointment asking the client how she has been doing since last seen. Therapist used CBT to engage using active listening and positive emotional support. Therapist used CBT to engage and ask the client about medication compliance and/ issues with medications. Therapist used CBT to ask the client about how she is coping with the grieving process. Therapist used CBT to engage and discuss her negative behaviors that have occurred in correlation to her family stress. Therapist used CBT to normalize the clients emotions within reason. Therapist used CBT ask the client to identify her progress with frequency of use with coping skills with continued practice in her daily activity.    Therapist assigned the client homework to write in her journal.    Plan: Return again in 4 weeks.  Diagnosis: MDD, recurrent episode, moderate with anxious distress  Collaboration of Care: Patient refused AEB none requested by the client.  Patient/Guardian was advised Release of Information must be obtained prior to any record release in order to collaborate their care with an outside provider. Patient/Guardian was advised if they have not already done so to contact the registration department to sign all necessary forms in  order for [place] to release information regarding their care.   Consent: Patient/Guardian gives verbal consent for treatment  and assignment of benefits for services provided during this visit. Patient/Guardian expressed understanding and agreed to proceed.   [place], LCSW 12/25/2021

## 2021-12-29 NOTE — Plan of Care (Signed)
  Problem: Depression CCP Problem  1  Goal: LTG: Saranya WILL SCORE LESS THAN 10 ON THE PATIENT HEALTH QUESTIONNAIRE (PHQ-9) Outcome: Progressing Goal: STG: Calvin WILL IDENTIFY 3 COGNITIVE PATTERNS AND BELIEFS THAT SUPPORT DEPRESSION Outcome: Progressing   

## 2021-12-30 ENCOUNTER — Telehealth (INDEPENDENT_AMBULATORY_CARE_PROVIDER_SITE_OTHER): Payer: No Payment, Other | Admitting: Psychiatry

## 2021-12-30 ENCOUNTER — Other Ambulatory Visit: Payer: Self-pay

## 2021-12-30 ENCOUNTER — Encounter (HOSPITAL_COMMUNITY): Payer: Self-pay | Admitting: Psychiatry

## 2021-12-30 DIAGNOSIS — F411 Generalized anxiety disorder: Secondary | ICD-10-CM | POA: Diagnosis not present

## 2021-12-30 DIAGNOSIS — G4700 Insomnia, unspecified: Secondary | ICD-10-CM | POA: Diagnosis not present

## 2021-12-30 DIAGNOSIS — F4310 Post-traumatic stress disorder, unspecified: Secondary | ICD-10-CM | POA: Diagnosis not present

## 2021-12-30 DIAGNOSIS — F331 Major depressive disorder, recurrent, moderate: Secondary | ICD-10-CM | POA: Diagnosis not present

## 2021-12-30 MED ORDER — HYDROXYZINE HCL 10 MG PO TABS
10.0000 mg | ORAL_TABLET | Freq: Three times a day (TID) | ORAL | 2 refills | Status: DC | PRN
  Filled 2021-12-30: qty 150, 50d supply, fill #0
  Filled 2022-02-26: qty 150, 30d supply, fill #0
  Filled 2022-03-23 – 2022-03-25 (×2): qty 150, 30d supply, fill #1

## 2021-12-30 MED ORDER — GABAPENTIN 100 MG PO CAPS
100.0000 mg | ORAL_CAPSULE | Freq: Three times a day (TID) | ORAL | 2 refills | Status: DC
  Filled 2021-12-30 – 2022-02-26 (×2): qty 90, 30d supply, fill #0
  Filled 2022-03-23 – 2022-03-25 (×2): qty 90, 30d supply, fill #1

## 2021-12-30 MED ORDER — OXCARBAZEPINE 150 MG PO TABS
150.0000 mg | ORAL_TABLET | Freq: Two times a day (BID) | ORAL | 2 refills | Status: DC
  Filled 2021-12-30 – 2022-02-26 (×2): qty 60, 30d supply, fill #0
  Filled 2022-03-23 – 2022-03-25 (×2): qty 60, 30d supply, fill #1

## 2021-12-30 NOTE — Progress Notes (Signed)
[facility] NP Progress Note  Patient Identification: Judy Hanna MRN:  [id number] Date of Evaluation:  12/30/2021 Referral Source: therapist Chief Complaint:   Chief Complaint   Anxiety; Depression; Follow-up    Visit Diagnosis:    ICD-10-CM   1. PTSD (post-traumatic stress disorder)  F43.10     2. Major depressive disorder, recurrent episode, moderate with anxious distress (HCC)  F33.1       History of Present Illness:   36 y/o female presents for follow up for depression and anxiety.  Depression fluctuates between high depression, no suicidal ideations. Her brother was killed in [place] in July which increased her symptoms.  High anxiety with anger feelings, occasional panic attacks.  The medications are "working", denies side effects.  Sleep is "good" with the hydroxyzine.  Trauma with nightmares occurring two times per week, flashbacks daily. Appetite fluctuates, weight gain recently.  Support system includes her sister. Denies any suicidal or homicidal ideation today and audio or visual hallucinations.   Family history: Brother: Bipolar, Schizophrenia, Depression, Anxiety Father: Mood issues "was going through therapy" "he was not diagnosed with anything"  Past Psychiatric History: PTSD, depression,anxiety  Past Medical History:  Past Medical History:  Diagnosis Date   BMI 50.0-59.9, adult ([place]) 06/26/2020   Fibroids     Past Surgical History:  Procedure Laterality Date   MYOMECTOMY N/A 06/18/2021   Procedure: ABDOMINAL MYOMECTOMY;  Surgeon: Griffin Basil, MD;  Location: [facility];  Service: Gynecology;  Laterality: N/A;   NO PAST SURGERIES      Family Psychiatric History: brother with schizoaffective disorder and father with mental health issues, depression and anxiety  Family History:  Family History  Problem Relation Age of Onset   Hypertension Mother    Hypertension Sister    Hypertension Maternal Grandmother     Social History:   Social History   Socioeconomic  History   Marital status: Single    Spouse name: Not on file   Number of children: 0   Years of education: Not on file   Highest education level: Some college, no degree  Occupational History   Occupation: home health care  Tobacco Use   Smoking status: Every Day    Packs/day: 1.00    Years: 3.00    Total pack years: 3.00    Types: Cigarettes   Smokeless tobacco: Never  Vaping Use   Vaping Use: Never used  Substance and Sexual Activity   Alcohol use: Yes    Comment: occisonally    Drug use: Not Currently    Types: Marijuana   Sexual activity: Yes    Partners: Female  Other Topics Concern   Not on file  Social History Narrative   ** Merged History Encounter **       Social Determinants of Health   Financial Resource Strain: Not on file  Food Insecurity: Not on file  Transportation Needs: Not on file  Physical Activity: Not on file  Stress: Not on file  Social Connections: Not on file    Additional Social History: moved from [place]:  No Known Allergies  Metabolic Disorder Labs: Lab Results  Component Value Date   HGBA1C 5.6 06/05/2019   No results found for: "PROLACTIN" Lab Results  Component Value Date   CHOL 185 06/05/2019   TRIG 63 06/05/2019   HDL 58 06/05/2019   CHOLHDL 3.2 06/05/2019   LDLCALC 115 (H) 06/05/2019   Lab Results  Component Value Date   TSH 2.317 11/10/2018  Therapeutic Level Labs: No results found for: "LITHIUM" No results found for: "CBMZ" No results found for: "VALPROATE"  Current Medications: Current Outpatient Medications  Medication Sig Dispense Refill   acetaminophen (TYLENOL) 500 MG tablet Take 2 tablets (1,000 mg total) by mouth every 6 (six) hours as needed for fever or headache. 100 tablet 1   albuterol (VENTOLIN HFA) 108 (90 Base) MCG/ACT inhaler Inhale 2 puffs into the lungs every 6 (six) hours as needed for wheezing or shortness of breath. 8.5 g 1   ascorbic acid (VITAMIN C) 500 MG tablet Take 1 tablet  (500 mg total) by mouth daily. 90 tablet 2   cholecalciferol (VITAMIN D3) 25 MCG (1000 UNIT) tablet Take 1,000 Units by mouth daily.     clindamycin (CLINDAGEL) 1 % gel Apply to affected area 2 (two) times daily. 30 g 0   cyclobenzaprine (FLEXERIL) 10 MG tablet Take 1 tablet (10 mg total) by mouth 2 (two) times daily as needed for muscle spasms. 20 tablet 0   escitalopram (LEXAPRO) 10 MG tablet Take 1 tablet (10 mg total) by mouth daily. 30 tablet 2   hydrOXYzine (ATARAX) 10 MG tablet Take 1 tablet (10 mg total) by mouth 3 (three) times daily as needed (may take one tablet as needed twice daily for anxiety, and 3 tablets at bedtime for sleep as needed). 150 tablet 2   ibuprofen (ADVIL) 600 MG tablet Take 1 tablet (600 mg total) by mouth every 6 (six) hours. 30 tablet 0   Multiple Vitamins-Minerals (MULTIVITAMIN WITH MINERALS) tablet Take 1 tablet by mouth 3 (three) times a week.     omeprazole (PRILOSEC) 20 MG capsule Take 1 capsule (20 mg total) by mouth 2 (two) times daily before a meal. 60 capsule 0   ondansetron (ZOFRAN) 4 MG tablet Take 1 tablet (4 mg total) by mouth every 6 (six) hours as needed for nausea. 20 tablet 0   simethicone (MYLICON) 80 MG chewable tablet Chew 1 tablet (80 mg total) by mouth 4 (four) times daily as needed for flatulence. 30 tablet 0   SUMAtriptan (IMITREX) 50 MG tablet Take 1 tablet at the start of the headache.  May repeat in 2 hours if headache does not resolve.  Max 2 hours / 24-hour. 10 tablet 1   Triclosan 0.3 % LIQD Apply liquid topically daily. Use in the shower, follow with clindamycin gel after showering before applying lotion 355 mL 3   zinc gluconate 50 MG tablet Take 50 mg by mouth daily.     No current facility-administered medications for this visit.    Musculoskeletal: Strength & Muscle Tone: WDL Gait & Station: WDL Patient leans: WDL  Psychiatric Specialty Exam: Review of Systems  Psychiatric/Behavioral:  Positive for dysphoric mood. The  patient is nervous/anxious.   All other systems reviewed and are negative.   There were no vitals taken for this visit.There is no height or weight on file to calculate BMI.  General Appearance: WDL  Eye Contact:  WDL  Speech:  Clear and Coherent  Volume:  Normal  Mood:  Anxious and Depressed  Affect:  WDL  Thought Process:  Coherent  Orientation:  Full (Time, Place, and Person)  Thought Content:  Rumination  Suicidal Thoughts:  No  Homicidal Thoughts:  No  Memory:  Immediate;   Good Recent;   Good Remote;   Good  Judgement:  Good  Insight:  Good  Psychomotor Activity:  Normal  Concentration:  Concentration: Good and Attention Span: Good  Recall:  Roel Cluck of Knowledge:Good  Language: Good  Akathisia:  No  Handed:  Right  AIMS (if indicated):  not done  Assets:  Housing Leisure Time Physical Health Resilience Social Support  ADL's:  Intact  Cognition: WNL  Sleep:  Fair   Screenings: GAD-7    Flowsheet Row Office Visit from 06/25/2021 in [place] [facility] for [employer] [facility] Office Visit from 05/19/2021 in [facility] Office Visit from 04/29/2021 in [facility] from 03/04/2021 in [facility] Office Visit from 02/10/2021 in [facility]  Total GAD-7 Score '7 4 14 13 7      '$ PHQ2-9    [facility] Office Visit from 06/25/2021 in [facility] Office Visit from 05/19/2021 in [facility] Office Visit from 04/29/2021 in [facility] Office Visit from 03/10/2021 in [facility] from 03/04/2021 in [facility]  PHQ-2 Total Score '2 1 4 2 6  '$ PHQ-9 Total Score '6 5 14 6 21      '$ [facility] ED from 12/11/2021 in  [facility] DEPT ED from 09/03/2021 in [facility] at [facility] Admission (Discharged) from 06/18/2021 in [facility]  C-SSRS RISK CATEGORY No Risk No Risk No Risk       Assessment and Plan:  Major depressive disorder, recurrent, moderate: -Discontinue Lexapro 10 mg daily Start Trileptal 150 mg BID  General anxiety disorder: -Continue hydroxyzine 10 mg TID PRN Or gabapentin 100 mg TID  Insomnia: -Continue 30 mg of hydroxyzine daily at bedtime as needed  Virtual Visit via Telephone Note  I connected with [name] on 12/30/21 at  3:00 PM EDT by telephone and verified that I am speaking with the correct person using two identifiers.  Location: Patient: home Provider: work office   I discussed the limitations, risks, security and privacy concerns of performing an evaluation and management service by telephone and the availability of in person appointments. I also discussed with the patient that there may be a patient responsible charge related to this service. The patient expressed understanding and agreed to proceed.  Follow Up Instructions: Follow up in 3 months   I discussed the assessment and treatment plan with the patient. The patient was provided an opportunity to ask questions and all were answered. The patient agreed with the plan and demonstrated an understanding of the instructions.   The patient was advised to call back or seek an in-person evaluation if the symptoms worsen or if the condition fails to improve as anticipated.  I provided 10 minutes of non-face-to-face time during this encounter.   Waylan Boga, NP    Waylan Boga, NP 9/5/20233:05 PM

## 2022-01-06 ENCOUNTER — Ambulatory Visit (HOSPITAL_COMMUNITY): Payer: No Payment, Other | Admitting: Clinical

## 2022-01-06 ENCOUNTER — Other Ambulatory Visit: Payer: Self-pay

## 2022-01-14 ENCOUNTER — Ambulatory Visit (INDEPENDENT_AMBULATORY_CARE_PROVIDER_SITE_OTHER): Payer: No Payment, Other | Admitting: Clinical

## 2022-01-14 DIAGNOSIS — F331 Major depressive disorder, recurrent, moderate: Secondary | ICD-10-CM | POA: Diagnosis not present

## 2022-01-14 NOTE — Progress Notes (Signed)
THERAPIST PROGRESS NOTE Virtual Visit via Video Note  I connected with Judy Hanna on 01/14/22 at  1:00 PM EDT by a video enabled telemedicine application and verified that I am speaking with the correct person using two identifiers.  Location: Patient: home Provider: office   I discussed the limitations of evaluation and management by telemedicine and the availability of in person appointments. The patient expressed understanding and agreed to proceed.   Follow Up Instructions: I discussed the assessment and treatment plan with the patient. The patient was provided an opportunity to ask questions and all were answered. The patient agreed with the plan and demonstrated an understanding of the instructions.   The patient was advised to call back or seek an in-person evaluation if the symptoms worsen or if the condition fails to improve as anticipated.   Session Time: 30 minutes  Participation Level: Active  Behavioral Response: CasualAlertEuthymic  Type of Therapy: Individual Therapy  Treatment Goals addressed: client will identify 3 cognitive patterns and beliefs that support depression  ProgressTowards Goals: Progressing  Interventions: CBT  Summary:  Judy Hanna is a 36 y.o. female who presents for the scheduled appointment oriented x5, appropriately dressed, and friendly.  Client denied hallucinations and delusions. Client reported on today she is doing well but she has been very busy.  Client reported since her brother's funeral she returned to [place] to spend her birthday with friends and family.  Client reported she had a very good time and she was made to be felt special.  Client reported upon returning home she was able to pick up more hours for work which has been keeping her busy.  Client reported she will take some time off in October to take a break from overworking herself.  Client reported she has spoken to be psychiatrist and her medications were adjusted to  help with her anxiety.  Client reported her medication regimen is going well so far.  Client reported her brother's passing and the circumstances of his death have been on her mind and she has been trying to cope.  Client reported she has been talking to herself in a way of understanding that it is a process and she is going to figure out how to go on with her life in the midst of getting answers from his passing. Evidence of progress towards goal: Client reported she is compliant with medications 7 days out of the week.  Client reported she will engage in at least 2 behavioral activation activities to help alleviate stress at least 4 days out of 7.  Suicidal/Homicidal: Nowithout intent/plan  Therapist Response:  Therapist began the appointment asking the client how she has been doing since last seen. Therapist used CBT to engage using active listening and positive emotional support towards her thoughts and feelings. Therapist used CBT to ask the client about medication regimen and compliance and how to helping with her symptoms. Therapist used CBT to ask the client open-ended questions about how she is processing grief ongoing and excepting her new norm. Therapist used CBT to discuss the grieving cycle, normalizing her emotions, and discussing self-care. Therapist used CBT ask the client to identify her progress with frequency of use with coping skills with continued practice in her daily activity.        Plan: Return again in 4 weeks.  Diagnosis: MDD, recurrent episode, moderate with anxious distress  Collaboration of Care: Patient refused AEB none requested by the client.  Patient/Guardian was advised Release of  Information must be obtained prior to any record release in order to collaborate their care with an outside provider. Patient/Guardian was advised if they have not already done so to contact the registration department to sign all necessary forms in order for [place] to release  information regarding their care.   Consent: Patient/Guardian gives verbal consent for treatment and assignment of benefits for services provided during this visit. Patient/Guardian expressed understanding and agreed to proceed.   [place], LCSW 01/14/2022

## 2022-01-17 NOTE — Plan of Care (Signed)
  Problem: Depression CCP Problem  1  Goal: LTG: Sakeena WILL SCORE LESS THAN 10 ON THE PATIENT HEALTH QUESTIONNAIRE (PHQ-9) Outcome: Progressing Goal: STG: Amyia WILL IDENTIFY 3 COGNITIVE PATTERNS AND BELIEFS THAT SUPPORT DEPRESSION Outcome: Progressing   

## 2022-01-28 ENCOUNTER — Ambulatory Visit: Payer: Self-pay | Admitting: *Deleted

## 2022-01-28 NOTE — Telephone Encounter (Signed)
  Chief Complaint: new onset dizziness Symptoms: dizziness when going from laying to standing Frequency: on/off- once today and last week Pertinent Negatives: Patient denies fever, chest pain, vomiting, diarrhea, bleeding Disposition: '[]'$ ED /'[]'$ Urgent Care (no appt availability in office) / '[]'$ Appointment(In office/virtual)/ '[]'$  [facility]/ '[]'$ Home Care/ '[]'$ Refused Recommended Disposition /'[x]'$ [facility]/ '[]'$  Follow-up with PCP Additional Notes: Patient advised needs to be seen 2-3 days- no open appointment- patient would like to come to office- will send note. Advised of other options- mobile unit/UC- not sure if patient will go- she wants to hear from provider.

## 2022-01-28 NOTE — Telephone Encounter (Signed)
Reason for Disposition  [1] MILD dizziness (e.g., walking normally) AND [2] has NOT been evaluated by doctor (or NP/PA) for this  (Exception: Dizziness caused by heat exposure, sudden standing, or poor fluid intake.)  Answer Assessment - Initial Assessment Questions 1. DESCRIPTION: "Describe your dizziness."     Woozy 2. LIGHTHEADED: "Do you feel lightheaded?" (e.g., somewhat faint, woozy, weak upon standing)     Woozy, off balance- lasted 20-30 minutes 3. VERTIGO: "Do you feel like either you or the room is spinning or tilting?" (i.e. vertigo)     no 4. SEVERITY: "How bad is it?"  "Do you feel like you are going to faint?" "Can you stand and walk?"   - MILD: Feels slightly dizzy, but walking normally.   - MODERATE: Feels unsteady when walking, but not falling; interferes with normal activities (e.g., school, work).   - SEVERE: Unable to walk without falling, or requires assistance to walk without falling; feels like passing out now.      mild 5. ONSET:  "When did the dizziness begin?"     Started today- has happened 3 different times- last week too 6. AGGRAVATING FACTORS: "Does anything make it worse?" (e.g., standing, change in head position)     Going from laying to sitting/standing 7. HEART RATE: "Can you tell me your heart rate?" "How many beats in 15 seconds?"  (Note: not all patients can do this)       No changes 8. CAUSE: "What do you think is causing the dizziness?"     unsure 9. RECURRENT SYMPTOM: "Have you had dizziness before?" If Yes, ask: "When was the last time?" "What happened that time?"     Long time ago- went away 10. OTHER SYMPTOMS: "Do you have any other symptoms?" (e.g., fever, chest pain, vomiting, diarrhea, bleeding)       Hx fibroids- heavy bleeding cycles 11. PREGNANCY: "Is there any chance you are pregnant?" "When was your last menstrual period?"        No- LMP- last week  Protocols used: Dizziness - Lightheadedness-A-AH

## 2022-01-29 NOTE — Telephone Encounter (Addendum)
Advised of hours of mobile unit to be seen today. Encouraged to follow up with UC or Mobile since no apt were available on the schedule today. Pt. Verbalized understanding.    Apt given for November 2nd to f/u with chronic conditions.

## 2022-01-31 ENCOUNTER — Encounter (HOSPITAL_COMMUNITY): Payer: Self-pay

## 2022-01-31 ENCOUNTER — Other Ambulatory Visit: Payer: Self-pay

## 2022-01-31 ENCOUNTER — Emergency Department (HOSPITAL_COMMUNITY): Payer: Self-pay

## 2022-01-31 ENCOUNTER — Emergency Department (HOSPITAL_COMMUNITY)
Admission: EM | Admit: 2022-01-31 | Discharge: 2022-01-31 | Disposition: A | Discharge status: Home / Self Care | Payer: Self-pay | Attending: Emergency Medicine | Admitting: Emergency Medicine

## 2022-01-31 DIAGNOSIS — M79601 Pain in right arm: Secondary | ICD-10-CM | POA: Insufficient documentation

## 2022-01-31 DIAGNOSIS — N9489 Other specified conditions associated with female genital organs and menstrual cycle: Secondary | ICD-10-CM | POA: Insufficient documentation

## 2022-01-31 DIAGNOSIS — F17210 Nicotine dependence, cigarettes, uncomplicated: Secondary | ICD-10-CM | POA: Insufficient documentation

## 2022-01-31 DIAGNOSIS — R42 Dizziness and giddiness: Secondary | ICD-10-CM | POA: Insufficient documentation

## 2022-01-31 LAB — CBC WITH DIFFERENTIAL/PLATELET
Abs Immature Granulocytes: 0.03 10*3/uL (ref 0.00–0.07)
Basophils Absolute: 0 10*3/uL (ref 0.0–0.1)
Basophils Relative: 1 %
Eosinophils Absolute: 0.3 10*3/uL (ref 0.0–0.5)
Eosinophils Relative: 4 %
HCT: 39.3 % (ref 36.0–46.0)
Hemoglobin: 13.4 g/dL (ref 12.0–15.0)
Immature Granulocytes: 0 %
Lymphocytes Relative: 25 %
Lymphs Abs: 2.2 10*3/uL (ref 0.7–4.0)
MCH: 26.5 pg (ref 26.0–34.0)
MCHC: 34.1 g/dL (ref 30.0–36.0)
MCV: 77.8 fL — ABNORMAL LOW (ref 80.0–100.0)
Monocytes Absolute: 0.6 10*3/uL (ref 0.1–1.0)
Monocytes Relative: 7 %
Neutro Abs: 5.6 10*3/uL (ref 1.7–7.7)
Neutrophils Relative %: 63 %
Platelets: 382 10*3/uL (ref 150–400)
RBC: 5.05 MIL/uL (ref 3.87–5.11)
RDW: 14.9 % (ref 11.5–15.5)
WBC: 8.8 10*3/uL (ref 4.0–10.5)
nRBC: 0 % (ref 0.0–0.2)

## 2022-01-31 LAB — CBG MONITORING, ED: Glucose-Capillary: 110 mg/dL — ABNORMAL HIGH (ref 70–99)

## 2022-01-31 LAB — BASIC METABOLIC PANEL
Anion gap: 5 (ref 5–15)
BUN: 6 mg/dL (ref 6–20)
CO2: 28 mmol/L (ref 22–32)
Calcium: 8.6 mg/dL — ABNORMAL LOW (ref 8.9–10.3)
Chloride: 105 mmol/L (ref 98–111)
Creatinine, Ser: 0.75 mg/dL (ref 0.44–1.00)
GFR, Estimated: >60 mL/min (ref >60)
Glucose, Bld: 97 mg/dL (ref 70–99)
Potassium: 3.8 mmol/L (ref 3.5–5.1)
Sodium: 138 mmol/L (ref 135–145)

## 2022-01-31 LAB — I-STAT BETA HCG BLOOD, ED (MC, WL, AP ONLY): I-stat hCG, quantitative: <5 m[IU]/mL (ref <5)

## 2022-01-31 MED ORDER — LACTATED RINGERS IV BOLUS
1000.0000 mL | Freq: Once | INTRAVENOUS | Status: AC
  Administered 2022-01-31: 1000 mL via INTRAVENOUS

## 2022-01-31 NOTE — ED Notes (Signed)
Pt used bedside commode but contaminated urine with tissue.  Waiting on Pt to urine again for urine sample.

## 2022-01-31 NOTE — ED Notes (Addendum)
Pt reports to this RN that she is having lightheadedness that was initially worse upon standing, but is now constant. Denies dizziness. Pt reports arm pain that started in the R and intermittent, but is now constant to R upper arm and is worse with movement. Pt reports she did have some pain in her L arm as well that went away. Denies any fever, ShOB, N/V, or urinary symptoms. Pt reports she has a boil to  groin that typically resolves on its own. Pt reports bilateral leg swelling that is worse when she is on her feet for long periods and better with elevation. 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

## 2022-01-31 NOTE — ED Triage Notes (Signed)
Patient c/o dizziness and right upper arm pain x 2 weeks, but worse today. Patient states at times the pain radiates into the right lateral neck.

## 2022-01-31 NOTE — ED Provider Notes (Signed)
[facility] DEPT Provider Note   CSN: [id number] Arrival date & time: 01/31/22  1018     History  Chief Complaint  Patient presents with   Dizziness   Arm Pain    Judy Hanna is a 36 y.o. female with history of morbid obesity who presents to the emergency department for evaluation of right arm pain with occasional dizziness that has been ongoing for about 2 weeks but worsened today.  Patient states that the pain in her arm comes and goes without obvious triggers or alleviating factors.  Pain is on the lateral aspect of her upper arm, and she describes the sensation as severe cramping.  Not relieved with ibuprofen.  Occasionally, she states that she feels pain further up closer to her neck.  Additionally, patient is complaining of dizziness described as lightheadedness.  Again this is without obvious triggers and she will sometimes feel her at rest as well as when she gets up and walks around.  She denies facial drooping, slurred speech, and ataxia.  She does state that when she sits somewhere like the toilet for too long, she does develop numbness and tingling in the bilateral lower extremities.  She also denies chest pain, shortness of breath, nausea, vomiting and diarrhea.  When asked, patient states that she does not drink very much water, usually only 1 water bottle per day.   Dizziness Arm Pain       Home Medications Prior to Admission medications   Medication Sig Start Date End Date Taking? Authorizing Provider  acetaminophen (TYLENOL) 500 MG tablet Take 2 tablets (1,000 mg total) by mouth every 6 (six) hours as needed for fever or headache. 12/11/21   Elsie Stain, MD  albuterol (VENTOLIN HFA) 108 (90 Base) MCG/ACT inhaler Inhale 2 puffs into the lungs every 6 (six) hours as needed for wheezing or shortness of breath. 04/29/21   Ladell Pier, MD  ascorbic acid (VITAMIN C) 500 MG tablet Take 1 tablet (500 mg total) by mouth daily. 12/11/21    Elsie Stain, MD  cholecalciferol (VITAMIN D3) 25 MCG (1000 UNIT) tablet Take 1,000 Units by mouth daily.    [provider]  clindamycin (CLINDAGEL) 1 % gel Apply to affected area 2 (two) times daily. 12/10/21   Gabriel Carina, CNM  cyclobenzaprine (FLEXERIL) 10 MG tablet Take 1 tablet (10 mg total) by mouth 2 (two) times daily as needed for muscle spasms. 12/11/21   Marcello Fennel, PA-C  gabapentin (NEURONTIN) 100 MG capsule Take 1 capsule (100 mg total) by mouth 3 (three) times daily. 12/30/21 12/30/22  Patrecia Pour, NP  hydrOXYzine (ATARAX) 10 MG tablet Take 1 tablet (10 mg total) by mouth 3 (three) times daily as needed (may take one tablet as needed twice daily for anxiety, and 3 tablets at bedtime for sleep as needed). 12/30/21   Patrecia Pour, NP  ibuprofen (ADVIL) 600 MG tablet Take 1 tablet (600 mg total) by mouth every 6 (six) hours. 06/20/21   Griffin Basil, MD  Multiple Vitamins-Minerals (MULTIVITAMIN WITH MINERALS) tablet Take 1 tablet by mouth 3 (three) times a week.    [provider]  omeprazole (PRILOSEC) 20 MG capsule Take 1 capsule (20 mg total) by mouth 2 (two) times daily before a meal. 01/13/21   Varney Biles, MD  ondansetron (ZOFRAN) 4 MG tablet Take 1 tablet (4 mg total) by mouth every 6 (six) hours as needed for nausea. 06/20/21   Lynnda Shields  A, MD  OXcarbazepine (TRILEPTAL) 150 MG tablet Take 1 tablet (150 mg total) by mouth 2 (two) times daily. 12/30/21 02/28/22  Patrecia Pour, NP  simethicone (MYLICON) 80 MG chewable tablet Chew 1 tablet (80 mg total) by mouth 4 (four) times daily as needed for flatulence. 06/20/21   Griffin Basil, MD  SUMAtriptan (IMITREX) 50 MG tablet Take 1 tablet at the start of the headache.  May repeat in 2 hours if headache does not resolve.  Max 2 hours / 24-hour. 02/10/21   Gildardo Pounds, NP  Triclosan 0.3 % LIQD Apply liquid topically daily. Use in the shower, follow with clindamycin gel after showering before  applying lotion 12/10/21   Gaylan Gerold R, CNM  zinc gluconate 50 MG tablet Take 50 mg by mouth daily.    [provider]      Allergies    Patient has no known allergies.    Review of Systems   Review of Systems  Neurological:  Positive for dizziness.    Physical Exam Updated Vital Signs BP 106/65   Pulse 64   Temp 98.6 F (37 C)   Resp 18   Ht '5\' 9"'$  (1.753 m)   Wt (!) 172.4 kg   LMP 01/26/2022   SpO2 100%   BMI 56.12 kg/m  Physical Exam Vitals and nursing note reviewed.  Constitutional:      General: She is not in acute distress.    Appearance: She is obese. She is not ill-appearing.  HENT:     Head: Atraumatic.  Eyes:     Conjunctiva/sclera: Conjunctivae normal.  Cardiovascular:     Rate and Rhythm: Normal rate and regular rhythm.     Pulses: Normal pulses.     Heart sounds: No murmur heard. Pulmonary:     Effort: Pulmonary effort is normal. No respiratory distress.     Breath sounds: Normal breath sounds.  Abdominal:     General: Abdomen is flat. There is no distension.     Palpations: Abdomen is soft.     Tenderness: There is no abdominal tenderness.  Musculoskeletal:        General: Normal range of motion.     Cervical back: Normal range of motion.     Comments: Right arm is without erythema, swelling, wounds or deformities.  Full active and passive range of motion.  No reproducible focal tenderness.  No Palpable cords.  Skin:    General: Skin is warm and dry.     Capillary Refill: Capillary refill takes less than 2 seconds.  Neurological:     General: No focal deficit present.     Mental Status: She is alert.     Comments: Speech is clear, able to follow commands CN III-XII intact Normal strength in upper and lower extremities bilaterally including dorsiflexion and plantar flexion, strong and equal grip strength Subjective sensation intact in upper and lower extremities Moves extremities without ataxia, coordination intact Normal finger to  nose and rapid alternating movements No pronator drift    Psychiatric:        Mood and Affect: Mood normal.     ED Results / Procedures / Treatments   Labs (all labs ordered are listed, but only abnormal results are displayed) Labs Reviewed  BASIC METABOLIC PANEL - Abnormal; Notable for the following components:      Result Value   Calcium 8.6 (*)    All other components within normal limits  CBC WITH DIFFERENTIAL/PLATELET - Abnormal; Notable for  the following components:   MCV 77.8 (*)    All other components within normal limits  CBG MONITORING, ED - Abnormal; Notable for the following components:   Glucose-Capillary 110 (*)    All other components within normal limits  I-STAT BETA HCG BLOOD, ED ([facility], [facility], [facility] ONLY)    EKG EKG Interpretation  Date/Time:  Saturday January 31 2022 10:37:13 EDT Ventricular Rate:  76 PR Interval:  173 QRS Duration: 79 QT Interval:  395 QTC Calculation: 445 R Axis:   35 Text Interpretation: Sinus rhythm Confirmed by Aletta Edouard [id number] on 01/31/2022 11:47:54 AM  Radiology CT Head Wo Contrast  Result Date: 01/31/2022 CLINICAL DATA:  Dizziness, headache. EXAM: CT HEAD WITHOUT CONTRAST TECHNIQUE: Contiguous axial images were obtained from the base of the skull through the vertex without intravenous contrast. RADIATION DOSE REDUCTION: This exam was performed according to the departmental dose-optimization program which includes automated exposure control, adjustment of the mA and/or kV according to patient size and/or use of iterative reconstruction technique. COMPARISON:  None Available. FINDINGS: Brain: No evidence of acute infarction, hemorrhage, hydrocephalus, extra-axial collection or mass lesion/mass effect. Vascular: No hyperdense vessel or unexpected calcification. Skull: Normal. Negative for fracture or focal lesion. Sinuses/Orbits: No acute finding. Other: None. IMPRESSION: No acute intracranial process. Electronically Signed   By: Zerita Boers M.D.   On: 01/31/2022 12:53    Procedures Procedures    Medications Ordered in ED Medications  lactated ringers bolus 1,000 mL (1,000 mLs Intravenous New Bag/Given 01/31/22 1313)    ED Course/ Medical Decision Making/ A&P                           Medical Decision Making Amount and/or Complexity of Data Reviewed Labs: ordered. Radiology: ordered.   Social determinants of health:  Social History   Socioeconomic History   Marital status: Single    Spouse name: Not on file   Number of children: 0   Years of education: Not on file   Highest education level: Some college, no degree  Occupational History   Occupation: home health care  Tobacco Use   Smoking status: Every Day    Packs/day: 1.00    Years: 3.00    Total pack years: 3.00    Types: Cigarettes   Smokeless tobacco: Never  Vaping Use   Vaping Use: Never used  Substance and Sexual Activity   Alcohol use: Yes    Comment: occisonally    Drug use: Not Currently    Types: Marijuana   Sexual activity: Yes    Partners: Female  Other Topics Concern   Not on file  Social History Narrative   ** Merged History Encounter **       Social Determinants of Health   Financial Resource Strain: Not on file  Food Insecurity: Not on file  Transportation Needs: Not on file  Physical Activity: Not on file  Stress: Not on file  Social Connections: Not on file  Intimate Partner Violence: Not on file     Initial impression:  This patient presents to the ED for concern of lightheadedness and right arm pain, this involves an extensive number of treatment options, and is a complaint that carries with it a high risk of complications and morbidity.   Differentials include stroke, dissection, muscle cramps, dislocation, fracture, overuse injury.   Comorbidities affecting care:  Obesity  Additional history obtained: Previous labs and imaging, negative DVT study last month  Lab Tests  I Ordered, reviewed, and  interpreted labs and EKG.  The pertinent results include:  BMP and CBC normal  Imaging Studies ordered:  I ordered imaging studies including  CT head without acute findings I independently visualized and interpreted imaging and I agree with the radiologist interpretation.   EKG: Normal sinus rhythm   Medicines ordered and prescription drug management:  I ordered medication including: 1 L LR bolus Reevaluation of the patient after these medicines showed that the patient improved I have reviewed the patients home medicines and have made adjustments as needed   ED Course/Re-evaluation: Patient presents in no acute distress and is nontoxic.  Vitals without significant abnormality.  She is satting well on room air.  On physical exam, right arm and shoulder are without acute findings.  Full range of motion, no evidence of cellulitis, thrombophlebitis, injury or dislocation.  No reproducible tenderness.  Symptoms were not consistent with a DVT of the right arm or with dissection.  Her neuro exam was normal.  I did order CT head out of abundance of caution which was negative for acute findings.  Labs and EKG overall reassuring.  Discussed with patient that she could likely be having symptoms due to dehydration given that she is only drinking 1 bottle of water per day.  She was given fluids here in the ED and advised to increase p.o. fluid intake at home to about 4 bottles of water.  Recommend follow-up with PCP if symptoms do not improve.  Patient agreeable to plan.  Disposition:  After consideration of the diagnostic results, physical exam, history and the patients response to treatment feel that the patent would benefit from discharge.   Lightheadedness Right arm pain: Plan and management as described above. Discharged home in good condition. Final Clinical Impression(s) / ED Diagnoses Final diagnoses:  Lightheadedness  Right arm pain    Rx / DC Orders ED Discharge Orders     None          Tonye Pearson, PA-C 01/31/22 1456    Hayden Rasmussen, MD 01/31/22 1711

## 2022-01-31 NOTE — ED Notes (Signed)
Pt provided discharge instructions and prescription information. Pt was given the opportunity to ask questions and questions were answered.   

## 2022-01-31 NOTE — Discharge Instructions (Signed)
Your work-up today was very reassuring.  Your labs and imaging were all normal.  I given you some fluids here today which should hopefully help with this is related to dehydration muscle cramps and lightheadedness.  I would recommend drinking at least 4 bottles of water a day at the bare minimum.  It is also possible that this could be an overuse injury which has caused some chronic inflammation which is why you feel occasional pain and discomfort.  If symptoms do not improve, please follow-up with your PCP.

## 2022-02-17 ENCOUNTER — Encounter (HOSPITAL_COMMUNITY): Payer: Self-pay

## 2022-02-17 ENCOUNTER — Telehealth (HOSPITAL_COMMUNITY): Payer: No Payment, Other | Admitting: Psychiatry

## 2022-02-20 ENCOUNTER — Ambulatory Visit (INDEPENDENT_AMBULATORY_CARE_PROVIDER_SITE_OTHER): Payer: No Payment, Other | Admitting: Clinical

## 2022-02-20 DIAGNOSIS — F331 Major depressive disorder, recurrent, moderate: Secondary | ICD-10-CM | POA: Diagnosis not present

## 2022-02-23 NOTE — Plan of Care (Signed)
  Problem: Depression CCP Problem  1  Goal: LTG: Kayline WILL SCORE LESS THAN 10 ON THE PATIENT HEALTH QUESTIONNAIRE (PHQ-9) Outcome: Progressing Goal: STG: Ysabela WILL IDENTIFY 3 COGNITIVE PATTERNS AND BELIEFS THAT SUPPORT DEPRESSION Outcome: Progressing

## 2022-02-23 NOTE — Progress Notes (Signed)
THERAPIST PROGRESS NOTE Virtual Visit via Video Note  I connected with Judy Hanna on 02/20/2022 at 11:00 AM EDT by a video enabled telemedicine application and verified that I am speaking with the correct person using two identifiers.  Location: Patient: home Provider: office   I discussed the limitations of evaluation and management by telemedicine and the availability of in person appointments. The patient expressed understanding and agreed to proceed.  Follow Up Instructions: I discussed the assessment and treatment plan with the patient. The patient was provided an opportunity to ask questions and all were answered. The patient agreed with the plan and demonstrated an understanding of the instructions.   The patient was advised to call back or seek an in-person evaluation if the symptoms worsen or if the condition fails to improve as anticipated.    Session Time: 30 minutes  Participation Level: Active  Behavioral Response: CasualAlertEuthymic  Type of Therapy: Individual Therapy  Treatment Goals addressed: Client will identify 3 cognitive patterns and believes that support depression  ProgressTowards Goals: Progressing  Interventions: CBT  Summary:  Judy Hanna is a 36 y.o. female who presents for scheduled appointment oriented x5, appropriately dressed, and friendly.  Client denied hallucinations and delusions. Client reported on today she is feeling Hanna well.  Client reported she recently came back from visiting her aunt and uncle in [place].  Client reported her uncle has been sick when she saw him he remembered who she was so it was a good visit.  Client reported she will try to make appoint to go see them periodically.  Client reported following trips that she takes that she tends to overwork herself to catch up on finances as well as keeping herself distracted.  Client reported every so walking she may have a movement where she cries while she is at work.   Client reported she thinks is because that she is not giving herself an opportunity to give herself a chance to think about what is bothering her.  Client reported she has not gotten around to journaling yet. Evidence of progress towards goal: Client reported she is taking her psychiatric medications 7 days/week.  Client reported 1 cognitive pattern that she avoids may be addressing her grief from the loss of her brother.  Suicidal/Homicidal: Nowithout intent/plan  Therapist Response:  Therapist began the appointment asking the client how she has been doing since last seen. Therapist used CBT to engage using active listening and positive emotional support. Therapist used CBT to ask the client about the current severity of her depressive symptoms and how it affects her daily functioning. Therapist used CBT to discuss avoidance and how it can continue to enable unwanted emotions. Therapist used CBT ask the client to identify her progress with frequency of use with coping skills with continued practice in her daily activity.    Therapist assigned client homework to practice self-care and to journal her thoughts.   Plan: Return again in 3 weeks.  Diagnosis: Major depressive disorder, recurrent episode, moderate with anxious distress  Collaboration of Care: Patient refused AEB none requested by the client at this time.  Patient/Guardian was advised Release of Information must be obtained prior to any record release in order to collaborate their care with an outside provider. Patient/Guardian was advised if they have not already done so to contact the registration department to sign all necessary forms in order for [place] to release information regarding their care.   Consent: Patient/Guardian gives verbal consent for treatment and assignment  of benefits for services provided during this visit. Patient/Guardian expressed understanding and agreed to proceed.   [place], LCSW 02/20/2022

## 2022-02-26 ENCOUNTER — Encounter: Payer: Self-pay | Admitting: Internal Medicine

## 2022-02-26 ENCOUNTER — Ambulatory Visit: Payer: Self-pay | Attending: Internal Medicine | Admitting: Internal Medicine

## 2022-02-26 ENCOUNTER — Other Ambulatory Visit: Payer: Self-pay

## 2022-02-26 ENCOUNTER — Other Ambulatory Visit (HOSPITAL_COMMUNITY)
Admission: RE | Admit: 2022-02-26 | Discharge: 2022-02-26 | Disposition: A | Discharge status: Home / Self Care | Payer: Self-pay | Source: Ambulatory Visit | Attending: Internal Medicine | Admitting: Internal Medicine

## 2022-02-26 VITALS — BP 129/88 | HR 96 | Temp 98.0°F | Ht 69.0 in | Wt 387.6 lb

## 2022-02-26 DIAGNOSIS — G5603 Carpal tunnel syndrome, bilateral upper limbs: Secondary | ICD-10-CM

## 2022-02-26 DIAGNOSIS — Z113 Encounter for screening for infections with a predominantly sexual mode of transmission: Secondary | ICD-10-CM

## 2022-02-26 DIAGNOSIS — F411 Generalized anxiety disorder: Secondary | ICD-10-CM

## 2022-02-26 DIAGNOSIS — Z23 Encounter for immunization: Secondary | ICD-10-CM

## 2022-02-26 DIAGNOSIS — F320 Major depressive disorder, single episode, mild: Secondary | ICD-10-CM

## 2022-02-26 DIAGNOSIS — M546 Pain in thoracic spine: Secondary | ICD-10-CM

## 2022-02-26 DIAGNOSIS — L0292 Furuncle, unspecified: Secondary | ICD-10-CM

## 2022-02-26 DIAGNOSIS — R030 Elevated blood-pressure reading, without diagnosis of hypertension: Secondary | ICD-10-CM

## 2022-02-26 MED ORDER — SULFAMETHOXAZOLE-TRIMETHOPRIM 800-160 MG PO TABS
1.0000 | ORAL_TABLET | Freq: Two times a day (BID) | ORAL | 0 refills | Status: DC
  Filled 2022-02-26: qty 14, 7d supply, fill #0

## 2022-02-26 NOTE — Progress Notes (Signed)
Patient ID: Judy Hanna, female    DOB: 1986-03-21  MRN: [id number]  CC: chronic ds management   Subjective: Judy Hanna is a 36 y.o. female who presents for chronic ds management.  Patient had her phone on.  She tells me that she was listening in on a conference call.  I requested that she turn it down while we do this visit. Her concerns today include:  Patient with history of tobacco dependence, obesity, fibroids, menstrual migraines   BP elev on last visit.  DASH discussed; she has not been adding salt to foods Checked BP at work 1 mth ago; it was 126/68.  BP 1 mth ago in ER was 106/65. Still smoking.  1 pk a day but now some days she can smoke 1.5 pk/day.  Anxiety and stress contributes.  Sees a psychiatrist and therapist.  On Trileptal and Hydroxyzine. Gives hx of manic depressive ds, anxiety and PTSD. Last saw therapist a few days ago.  Missed her last appointment with her psychiatrist but plans to reschedule Not mental ready to quit smoking  Request STI screening.  No symptoms at this time.  Pt is bisexual.  Found out 4 mths ago that one of her female partners was HIV pos but was undetectable on HART.  Partner did not tell her at the time but she found out later.  Pt has had HIV screening several times since then that were negative.  She is no longer with that partner.  Get boils intermittently on mons pubis, upper thigh and on buttock.  Can also come on with menses.  Complains of some pain in the mid thoracic spine midline x3 weeks.  She points to the area where the bra strap is located Pain is intermittent and occurs mainly at nights.  She does not feel it during the day.  No initiating factors.  She has been using some muscle relaxant which helps.  Last night she felt some pain in the left upper abdomen that went away.  Seen in the ER about a month ago for some pain in the right upper arm.  Over the past several weeks she has noticed intermittent numbness in the fingers that  can occur at night and during the day.  She works as an Engineer, production.  Sometimes she also noticed numbness in 1 arm or the other at nights if she is laying on that side.    Patient Active Problem List   Diagnosis Date Noted   Visit for wound check 06/25/2021   Fibroid uterus 06/18/2021   S/P myomectomy 06/18/2021   PTSD (post-traumatic stress disorder) 03/10/2021   Major depressive disorder, recurrent episode, moderate with anxious distress ([place]) 03/04/2021   BMI 50.0-59.9, adult ([place]) 06/26/2020   Tobacco abuse 06/26/2020   Fibroids 05/22/2020   Axillary abscess 05/22/2020   Essential hypertension 05/22/2020   Morbid obesity ([place]) 06/05/2019   Tobacco dependence 06/05/2019   Menstrual migraine without status migrainosus, not intractable 06/05/2019   History of abnormal cervical Pap smear 06/05/2019     Current Outpatient Medications on File Prior to Visit  Medication Sig Dispense Refill   acetaminophen (TYLENOL) 500 MG tablet Take 2 tablets (1,000 mg total) by mouth every 6 (six) hours as needed for fever or headache. 100 tablet 1   albuterol (VENTOLIN HFA) 108 (90 Base) MCG/ACT inhaler Inhale 2 puffs into the lungs every 6 (six) hours as needed for wheezing or shortness of breath. 8.5 g 1   ascorbic acid (VITAMIN C)  500 MG tablet Take 1 tablet (500 mg total) by mouth daily. 90 tablet 2   cholecalciferol (VITAMIN D3) 25 MCG (1000 UNIT) tablet Take 1,000 Units by mouth daily.     clindamycin (CLINDAGEL) 1 % gel Apply to affected area 2 (two) times daily. 30 g 0   cyclobenzaprine (FLEXERIL) 10 MG tablet Take 1 tablet (10 mg total) by mouth 2 (two) times daily as needed for muscle spasms. 20 tablet 0   gabapentin (NEURONTIN) 100 MG capsule Take 1 capsule (100 mg total) by mouth 3 (three) times daily. 90 capsule 2   hydrOXYzine (ATARAX) 10 MG tablet Take 1 tablet (10 mg total) by mouth 3 (three) times daily as needed (may take one tablet as needed twice daily for anxiety, and 3 tablets at bedtime  for sleep as needed). 150 tablet 2   ibuprofen (ADVIL) 600 MG tablet Take 1 tablet (600 mg total) by mouth every 6 (six) hours. 30 tablet 0   Multiple Vitamins-Minerals (MULTIVITAMIN WITH MINERALS) tablet Take 1 tablet by mouth 3 (three) times a week.     omeprazole (PRILOSEC) 20 MG capsule Take 1 capsule (20 mg total) by mouth 2 (two) times daily before a meal. 60 capsule 0   ondansetron (ZOFRAN) 4 MG tablet Take 1 tablet (4 mg total) by mouth every 6 (six) hours as needed for nausea. 20 tablet 0   OXcarbazepine (TRILEPTAL) 150 MG tablet Take 1 tablet (150 mg total) by mouth 2 (two) times daily. 60 tablet 2   simethicone (MYLICON) 80 MG chewable tablet Chew 1 tablet (80 mg total) by mouth 4 (four) times daily as needed for flatulence. 30 tablet 0   SUMAtriptan (IMITREX) 50 MG tablet Take 1 tablet at the start of the headache.  May repeat in 2 hours if headache does not resolve.  Max 2 hours / 24-hour. 10 tablet 1   Triclosan 0.3 % LIQD Apply liquid topically daily. Use in the shower, follow with clindamycin gel after showering before applying lotion 355 mL 3   zinc gluconate 50 MG tablet Take 50 mg by mouth daily.     No current facility-administered medications on file prior to visit.    No Known Allergies  Social History   Socioeconomic History   Marital status: Single    Spouse name: Not on file   Number of children: 0   Years of education: Not on file   Highest education level: Some college, no degree  Occupational History   Occupation: home health care  Tobacco Use   Smoking status: Every Day    Packs/day: 1.00    Years: 3.00    Total pack years: 3.00    Types: Cigarettes   Smokeless tobacco: Never  Vaping Use   Vaping Use: Never used  Substance and Sexual Activity   Alcohol use: Not Currently    Comment: occisonally    Drug use: Not Currently    Types: Marijuana   Sexual activity: Yes    Partners: Female  Other Topics Concern   Not on file  Social History Narrative    ** Merged History Encounter **       Social Determinants of Health   Financial Resource Strain: Not on file  Food Insecurity: Not on file  Transportation Needs: Not on file  Physical Activity: Not on file  Stress: Not on file  Social Connections: Not on file  Intimate Partner Violence: Not on file    Family History  Problem Relation Age of  Onset   Hypertension Mother    Hypertension Sister    Hypertension Maternal Grandmother     Past Surgical History:  Procedure Laterality Date   MYOMECTOMY N/A 06/18/2021   Procedure: ABDOMINAL MYOMECTOMY;  Surgeon: Griffin Basil, MD;  Location: [facility];  Service: Gynecology;  Laterality: N/A;   NO PAST SURGERIES      ROS: Review of Systems Negative except as stated above  PHYSICAL EXAM: BP 129/88   Pulse 96   Temp 98 F (36.7 C) (Temporal)   Ht '5\' 9"'$  (1.753 m)   Wt (!) 387 lb 9.6 oz (175.8 kg)   LMP 02/20/2022 (Approximate)   SpO2 98%   BMI 57.24 kg/m   Wt Readings from Last 3 Encounters:  02/26/22 (!) 387 lb 9.6 oz (175.8 kg)  01/31/22 (!) 380 lb (172.4 kg)  12/11/21 (!) 383 lb (173.7 kg)    Physical Exam  General appearance - alert, well appearing, morbidly obese African-American female and in no distress Mental status - normal mood, behavior, speech, dress, motor activity, and thought processes Neck - supple, no significant adenopathy Chest - clear to auscultation, no wheezes, rales or rhonchi, symmetric air entry Heart - normal rate, regular rhythm, normal S1, S2, no murmurs, rubs, clicks or gallops Neurological -power in both upper extremities including grip 5/5 bilaterally.  Tinel's sign positive on the right. Extremities -no lower extremity edema. MSK: No tenderness on palpation of the mid thoracic spine and surrounding paraspinal muscles Skin: She has a small pimple on the posterior left upper thigh just below the buttock.    07/01/2021    1:53 PM 05/19/2021    3:01 PM 04/29/2021    4:15 PM  Depression screen  PHQ 2/9  Decreased Interest 1 0 2  Down, Depressed, Hopeless '1 1 2  '$ PHQ - 2 Score '2 1 4  '$ Altered sleeping '1 1 2  '$ Tired, decreased energy '1 1 2  '$ Change in appetite '1 1 2  '$ Feeling bad or failure about yourself  0 0 2  Trouble concentrating '1 1 2  '$ Moving slowly or fidgety/restless 0 0 0  Suicidal thoughts 0 0 0  PHQ-9 Score '6 5 14      '$ 07/01/2021    1:53 PM 05/19/2021    3:02 PM 04/29/2021    4:15 PM 03/04/2021   11:22 AM  GAD 7 : Generalized Anxiety Score  Nervous, Anxious, on Edge '1 1 2   '$ Control/stop worrying '1 1 2   '$ Worry too much - different things '1 1 2   '$ Trouble relaxing 1 0 2   Restless 1 0 2   Easily annoyed or irritable '1 1 2   '$ Afraid - awful might happen 1 0 2   Total GAD 7 Score '7 4 14   '$ Anxiety Difficulty         Information is confidential and restricted. Go to Review Flowsheets to unlock data.         Latest Ref Rng & Units 01/31/2022   12:39 PM 12/11/2021    5:15 PM 06/19/2021    5:23 AM  CMP  Glucose 70 - 99 mg/dL 97  104    BUN 6 - 20 mg/dL 6  12    Creatinine 0.44 - 1.00 mg/dL 0.75  1.01  0.75   Sodium 135 - 145 mmol/L 138  138    Potassium 3.5 - 5.1 mmol/L 3.8  3.6    Chloride 98 - 111 mmol/L 105  106  CO2 22 - 32 mmol/L 28  25    Calcium 8.9 - 10.3 mg/dL 8.6  8.8     Lipid Panel     Component Value Date/Time   CHOL 185 06/05/2019 1131   TRIG 63 06/05/2019 1131   HDL 58 06/05/2019 1131   CHOLHDL 3.2 06/05/2019 1131   LDLCALC 115 (H) 06/05/2019 1131    CBC    Component Value Date/Time   WBC 8.8 01/31/2022 1239   RBC 5.05 01/31/2022 1239   HGB 13.4 01/31/2022 1239   HGB 13.5 06/05/2019 1131   HCT 39.3 01/31/2022 1239   HCT 39.5 06/05/2019 1131   PLT 382 01/31/2022 1239   PLT 374 06/05/2019 1131   MCV 77.8 (L) 01/31/2022 1239   MCV 79 06/05/2019 1131   MCH 26.5 01/31/2022 1239   MCHC 34.1 01/31/2022 1239   RDW 14.9 01/31/2022 1239   RDW 15.0 06/05/2019 1131   LYMPHSABS 2.2 01/31/2022 1239   MONOABS 0.6 01/31/2022 1239   EOSABS 0.3  01/31/2022 1239   BASOSABS 0.0 01/31/2022 1239    ASSESSMENT AND PLAN: 1. Routine screening for STI (sexually transmitted infection) - HIV antibody (with reflex) - Hepatitis C Antibody - RPR - Cervicovaginal ancillary only  2. Recurrent boils Most likely due to MRSA. Will give prescription for Bactrim to use in the event that the current small pimple gets larger. Discussed the importance of good handwashing and not sharing things like towels.  We will also give a trial of mupirocin nasal ointment for 5 days - sulfamethoxazole-trimethoprim (BACTRIM DS) 800-160 MG tablet; Take 1 tablet by mouth 2 (two) times daily.  Dispense: 14 tablet; Refill: 0  3. Midline thoracic back pain, unspecified chronicity Most likely musculoskeletal in nature.  She can continue to use the muscle relaxant that she has.  I also recommend using a heating pad at night.  If this does not resolve, she should let me know so that we can do an x-ray.  4. Carpal tunnel syndrome on both sides Discussed carpal tunnel syndrome diagnoses.  I asked my CMA to check to see if we had cock-up wrist splints that would fit her.  If we do not, advised that she can purchase it at any medical supply store. Advised to avoid laying on one arm too long at nights as this can cause nerve compression and probably causes the numbness in her arm  5. Mild major depression ([place]) 6. Generalized anxiety disorder Already plugged in with behavioral health services.  7. Elevated blood-pressure reading without diagnosis of hypertension Continue DASH diet.  Request that she try to check her blood pressure more often at least twice a week and record the readings.  Bring those readings with her in 6 weeks to see our clinical pharmacist.    8. Need for immunization against influenza - Flu Vaccine QUAD 10moIM (Fluarix, Fluzone & Alfiuria Quad PF)     Patient was given the opportunity to ask questions.  Patient verbalized understanding of the plan  and was able to repeat key elements of the plan.   This documentation was completed using DR[employer]io producer  Any transcriptional errors are unintentional.  Orders Placed This Encounter  Procedures   Flu Vaccine QUAD 6104moM (Fluarix, Fluzone & Alfiuria Quad PF)   HIV antibody (with reflex)   Hepatitis C Antibody   RPR     Requested Prescriptions   Signed Prescriptions Disp Refills   sulfamethoxazole-trimethoprim (BACTRIM DS) 800-160 MG tablet 14 tablet 0  Sig: Take 1 tablet by mouth 2 (two) times daily.    Return for Appt with Lurena Joiner in 6 wks for BP check.  Karle Plumber, MD, FACP

## 2022-02-26 NOTE — Patient Instructions (Addendum)
Use a heating pad for about 10 to 20 minutes at nights to your back before going to bed.  You can continue to use your muscle relaxant as needed as well.  If the back pain does not resolve, please let me know so that we can do imaging study.  Get a pair of wrist splint and wear them at nights.  Check your blood pressure at least twice a week.  Record the readings.  We will have you follow-up with our clinical pharmacist in 6 weeks for repeat blood pressure check.

## 2022-02-27 LAB — HIV ANTIBODY (ROUTINE TESTING W REFLEX): HIV Screen 4th Generation wRfx: NONREACTIVE

## 2022-02-27 LAB — HEPATITIS C ANTIBODY: Hep C Virus Ab: NONREACTIVE

## 2022-02-27 LAB — RPR: RPR Ser Ql: NONREACTIVE

## 2022-03-02 LAB — CERVICOVAGINAL ANCILLARY ONLY
Chlamydia: NEGATIVE
Comment: NEGATIVE
Comment: NEGATIVE
Comment: NORMAL
Neisseria Gonorrhea: NEGATIVE
Trichomonas: NEGATIVE

## 2022-03-11 ENCOUNTER — Encounter (HOSPITAL_COMMUNITY): Payer: Self-pay

## 2022-03-11 ENCOUNTER — Ambulatory Visit: Payer: Self-pay

## 2022-03-11 ENCOUNTER — Other Ambulatory Visit: Payer: Self-pay

## 2022-03-11 ENCOUNTER — Ambulatory Visit (HOSPITAL_COMMUNITY)
Admission: EM | Admit: 2022-03-11 | Discharge: 2022-03-11 | Disposition: A | Discharge status: Home / Self Care | Payer: Self-pay | Attending: Internal Medicine | Admitting: Internal Medicine

## 2022-03-11 DIAGNOSIS — Z1152 Encounter for screening for COVID-19: Secondary | ICD-10-CM | POA: Insufficient documentation

## 2022-03-11 DIAGNOSIS — J45909 Unspecified asthma, uncomplicated: Secondary | ICD-10-CM | POA: Insufficient documentation

## 2022-03-11 DIAGNOSIS — J029 Acute pharyngitis, unspecified: Secondary | ICD-10-CM | POA: Insufficient documentation

## 2022-03-11 DIAGNOSIS — J209 Acute bronchitis, unspecified: Secondary | ICD-10-CM

## 2022-03-11 DIAGNOSIS — R051 Acute cough: Secondary | ICD-10-CM

## 2022-03-11 DIAGNOSIS — F17210 Nicotine dependence, cigarettes, uncomplicated: Secondary | ICD-10-CM | POA: Insufficient documentation

## 2022-03-11 DIAGNOSIS — Z7951 Long term (current) use of inhaled steroids: Secondary | ICD-10-CM | POA: Insufficient documentation

## 2022-03-11 DIAGNOSIS — R042 Hemoptysis: Secondary | ICD-10-CM | POA: Insufficient documentation

## 2022-03-11 DIAGNOSIS — R0602 Shortness of breath: Secondary | ICD-10-CM

## 2022-03-11 DIAGNOSIS — Z7952 Long term (current) use of systemic steroids: Secondary | ICD-10-CM | POA: Insufficient documentation

## 2022-03-11 DIAGNOSIS — Z79899 Other long term (current) drug therapy: Secondary | ICD-10-CM | POA: Insufficient documentation

## 2022-03-11 DIAGNOSIS — I10 Essential (primary) hypertension: Secondary | ICD-10-CM | POA: Insufficient documentation

## 2022-03-11 DIAGNOSIS — J40 Bronchitis, not specified as acute or chronic: Secondary | ICD-10-CM

## 2022-03-11 DIAGNOSIS — R0981 Nasal congestion: Secondary | ICD-10-CM

## 2022-03-11 HISTORY — DX: Unspecified asthma, uncomplicated: J45.909

## 2022-03-11 HISTORY — DX: Bronchitis, not specified as acute or chronic: J40

## 2022-03-11 MED ORDER — METHYLPREDNISOLONE SODIUM SUCC 125 MG IJ SOLR
INTRAMUSCULAR | Status: AC
  Filled 2022-03-11: qty 2

## 2022-03-11 MED ORDER — BENZONATATE 100 MG PO CAPS
100.0000 mg | ORAL_CAPSULE | Freq: Three times a day (TID) | ORAL | 0 refills | Status: DC
Start: 2022-03-11 — End: 2022-05-18
  Filled 2022-03-11: qty 21, 7d supply, fill #0

## 2022-03-11 MED ORDER — METHYLPREDNISOLONE SODIUM SUCC 125 MG IJ SOLR
80.0000 mg | Freq: Once | INTRAMUSCULAR | Status: AC
Start: 2022-03-11 — End: 2022-03-11
  Administered 2022-03-11: 80 mg via INTRAMUSCULAR

## 2022-03-11 MED ORDER — IPRATROPIUM-ALBUTEROL 0.5-2.5 (3) MG/3ML IN SOLN
RESPIRATORY_TRACT | Status: AC
  Filled 2022-03-11: qty 3

## 2022-03-11 MED ORDER — METHYLPREDNISOLONE SODIUM SUCC 125 MG IJ SOLR: 80.0000 mg | Freq: Once | INTRAMUSCULAR | Status: DC

## 2022-03-11 MED ORDER — ALBUTEROL SULFATE HFA 108 (90 BASE) MCG/ACT IN AERS
1.0000 | INHALATION_SPRAY | Freq: Four times a day (QID) | RESPIRATORY_TRACT | 1 refills | Status: DC | PRN
  Filled 2022-03-11: qty 6.7, 25d supply, fill #0
  Filled 2022-06-22: qty 6.7, 25d supply, fill #1

## 2022-03-11 MED ORDER — PREDNISONE 20 MG PO TABS
40.0000 mg | ORAL_TABLET | Freq: Every day | ORAL | 0 refills | Status: AC
  Filled 2022-03-11: qty 10, 5d supply, fill #0

## 2022-03-11 MED ORDER — GUAIFENESIN ER 600 MG PO TB12
1200.0000 mg | ORAL_TABLET | Freq: Two times a day (BID) | ORAL | 0 refills | Status: DC
  Filled 2022-03-11 – 2022-03-23 (×2): qty 30, 8d supply, fill #0

## 2022-03-11 MED ORDER — IPRATROPIUM-ALBUTEROL 0.5-2.5 (3) MG/3ML IN SOLN
3.0000 mL | Freq: Once | RESPIRATORY_TRACT | Status: AC
  Administered 2022-03-11: 3 mL via RESPIRATORY_TRACT

## 2022-03-11 NOTE — ED Provider Notes (Signed)
[place]    CSN: [id number] Arrival date & time: 03/11/22  1027      History   Chief Complaint Chief Complaint  Patient presents with   Epistaxis   Hemoptysis   Shortness of Breath    HPI Judy Hanna is a 36 y.o. female.   Patient presents urgent care for evaluation of cough, nasal congestion, shortness of breath, and fatigue for the last 3 days.  She states her symptoms started 3 days ago with a dry cough and the cough has become more productive since onset with yellow/green sputum.  Nasal congestion is thick making it difficult for her to breathe out of her nose.  Experiencing slight sore throat due to coughing.  She has a history of asthma and had some prednisone at home so she took 40 mg yesterday.  She has not had any prednisone this morning and is currently short of breath.  She has been using her albuterol inhaler numerous times in the last 24 hours to help with cough and wheeze.  Also reports episodes of hemoptysis and intermittent blood in the nasal mucus starting this morning.  She believes that this is because of her air vent position next to her bed blowing dry air into her mouth and nose all night.  Denies history of hospitalization related to asthma in the past.  She is a current every day smoker but denies other drug use.  She is not vaccinated against COVID-19 but has received her influenza vaccine this year.  No fever or chills, nausea, vomiting, dizziness, abdominal pain, body aches, ear pain, vision changes, or generalized weakness.  Denies chest pain and heart palpitations.  She is not a diabetic but reports history of allergic rhinitis for which she takes cetirizine daily.  No known sick contacts with similar symptoms.  She has not attempted use of any over-the-counter medications prior to arrival urgent care for symptoms.  She took a COVID test at home prior to arrival urgent care and this test was negative.   Epistaxis Shortness of Breath   Past  Medical History:  Diagnosis Date   Asthma    BMI 50.0-59.9, adult ([place]) 06/26/2020   Bronchitis    Fibroids     Patient Active Problem List   Diagnosis Date Noted   Visit for wound check 06/25/2021   Fibroid uterus 06/18/2021   S/P myomectomy 06/18/2021   PTSD (post-traumatic stress disorder) 03/10/2021   Major depressive disorder, recurrent episode, moderate with anxious distress ([place]) 03/04/2021   BMI 50.0-59.9, adult ([place]) 06/26/2020   Tobacco abuse 06/26/2020   Fibroids 05/22/2020   Axillary abscess 05/22/2020   Essential hypertension 05/22/2020   Morbid obesity ([place]) 06/05/2019   Tobacco dependence 06/05/2019   Menstrual migraine without status migrainosus, not intractable 06/05/2019   History of abnormal cervical Pap smear 06/05/2019    Past Surgical History:  Procedure Laterality Date   MYOMECTOMY N/A 06/18/2021   Procedure: ABDOMINAL MYOMECTOMY;  Surgeon: Griffin Basil, MD;  Location: [facility];  Service: Gynecology;  Laterality: N/A;   NO PAST SURGERIES      OB History     Gravida  3   Para  0   Term  0   Preterm  0   AB  3   Living         SAB  0   IAB  3   Ectopic  0   Multiple      Live Births  Home Medications    Prior to Admission medications   Medication Sig Start Date End Date Taking? Authorizing Provider  acetaminophen (TYLENOL) 500 MG tablet Take 2 tablets (1,000 mg total) by mouth every 6 (six) hours as needed for fever or headache. 12/11/21  Yes Elsie Stain, MD  ascorbic acid (VITAMIN C) 500 MG tablet Take 1 tablet (500 mg total) by mouth daily. 12/11/21  Yes Elsie Stain, MD  benzonatate (TESSALON) 100 MG capsule Take 1 capsule (100 mg total) by mouth every 8 (eight) hours. 03/11/22  Yes Talbot Grumbling, FNP  cholecalciferol (VITAMIN D3) 25 MCG (1000 UNIT) tablet Take 1,000 Units by mouth daily.   Yes [provider]  clindamycin (CLINDAGEL) 1 % gel Apply to affected area 2 (two) times  daily. 12/10/21  Yes Gaylan Gerold R, CNM  cyclobenzaprine (FLEXERIL) 10 MG tablet Take 1 tablet (10 mg total) by mouth 2 (two) times daily as needed for muscle spasms. 12/11/21  Yes Marcello Fennel, PA-C  gabapentin (NEURONTIN) 100 MG capsule Take 1 capsule (100 mg total) by mouth 3 (three) times daily. 12/30/21 12/30/22 Yes Patrecia Pour, NP  guaiFENesin (MUCINEX) 600 MG 12 hr tablet Take 2 tablets (1,200 mg total) by mouth 2 (two) times daily. 03/11/22  Yes Talbot Grumbling, FNP  hydrOXYzine (ATARAX) 10 MG tablet Take 1 tablet (10 mg total) by mouth 3 (three) times daily as needed (may take one tablet as needed twice daily for anxiety, and 3 tablets at bedtime for sleep as needed). 12/30/21  Yes Patrecia Pour, NP  ibuprofen (ADVIL) 600 MG tablet Take 1 tablet (600 mg total) by mouth every 6 (six) hours. 06/20/21  Yes Griffin Basil, MD  Multiple Vitamins-Minerals (MULTIVITAMIN WITH MINERALS) tablet Take 1 tablet by mouth 3 (three) times a week.   Yes [provider]  omeprazole (PRILOSEC) 20 MG capsule Take 1 capsule (20 mg total) by mouth 2 (two) times daily before a meal. 01/13/21  Yes Nanavati, Ankit, MD  ondansetron (ZOFRAN) 4 MG tablet Take 1 tablet (4 mg total) by mouth every 6 (six) hours as needed for nausea. 06/20/21  Yes Griffin Basil, MD  OXcarbazepine (TRILEPTAL) 150 MG tablet Take 1 tablet (150 mg total) by mouth 2 (two) times daily. 12/30/21 03/28/22 Yes Lord, Asa Saunas, NP  predniSONE (DELTASONE) 20 MG tablet Take 2 tablets (40 mg total) by mouth daily for 5 days. 03/11/22 03/16/22 Yes Geanie Pacifico, Stasia Cavalier, FNP  simethicone (MYLICON) 80 MG chewable tablet Chew 1 tablet (80 mg total) by mouth 4 (four) times daily as needed for flatulence. 06/20/21  Yes Griffin Basil, MD  sulfamethoxazole-trimethoprim (BACTRIM DS) 800-160 MG tablet Take 1 tablet by mouth 2 (two) times daily. 02/26/22  Yes Ladell Pier, MD  SUMAtriptan (IMITREX) 50 MG tablet Take 1 tablet at the  start of the headache.  May repeat in 2 hours if headache does not resolve.  Max 2 hours / 24-hour. 02/10/21  Yes Gildardo Pounds, NP  Triclosan 0.3 % LIQD Apply liquid topically daily. Use in the shower, follow with clindamycin gel after showering before applying lotion 12/10/21  Yes Gaylan Gerold R, CNM  zinc gluconate 50 MG tablet Take 50 mg by mouth daily.   Yes [provider]  albuterol (VENTOLIN HFA) 108 (90 Base) MCG/ACT inhaler Inhale 1-2 puffs into the lungs every 6 (six) hours as needed for wheezing or shortness of breath. 03/11/22   Talbot Grumbling, FNP  Family History Family History  Problem Relation Age of Onset   Hypertension Mother    Hypertension Sister    Hypertension Maternal Grandmother     Social History Social History   Tobacco Use   Smoking status: Every Day    Packs/day: 1.00    Years: 3.00    Total pack years: 3.00    Types: Cigarettes   Smokeless tobacco: Never  Vaping Use   Vaping Use: Never used  Substance Use Topics   Alcohol use: Not Currently    Comment: occisonally    Drug use: Not Currently    Types: Marijuana     Allergies   Patient has no known allergies.   Review of Systems Review of Systems  HENT:  Positive for nosebleeds.   Respiratory:  Positive for shortness of breath.   Per HPI   Physical Exam Triage Vital Signs ED Triage Vitals [03/11/22 1049]  Enc Vitals Group     BP 138/88     Pulse Rate 93     Resp 20     Temp 98.4 F (36.9 C)     Temp Source Oral     SpO2 99 %     Weight      Height      Head Circumference      Peak Flow      Pain Score 0     Pain Loc      Pain Edu?      Excl. in [place]?    No data found.  Updated Vital Signs BP 138/88 (BP Location: Right Wrist)   Pulse 93   Temp 98.4 F (36.9 C) (Oral)   Resp 20   LMP 02/20/2022 (Approximate)   SpO2 99%   Visual Acuity Right Eye Distance:   Left Eye Distance:   Bilateral Distance:    Right Eye Near:   Left Eye Near:     Bilateral Near:     Physical Exam Vitals and nursing note reviewed.  Constitutional:      Appearance: She is obese. She is ill-appearing. She is not toxic-appearing.  HENT:     Head: Normocephalic and atraumatic.     Right Ear: Hearing, tympanic membrane, ear canal and external ear normal.     Left Ear: Hearing, tympanic membrane, ear canal and external ear normal.     Nose: Nose normal.     Right Nostril: No epistaxis.     Left Nostril: No epistaxis.     Right Turbinates: Swollen.     Left Turbinates: Swollen.     Mouth/Throat:     Lips: Pink.     Mouth: Mucous membranes are moist.     Pharynx: Oropharyngeal exudate present.  Eyes:     General: Lids are normal. Vision grossly intact. Gaze aligned appropriately.     Extraocular Movements: Extraocular movements intact.     Conjunctiva/sclera: Conjunctivae normal.     Pupils: Pupils are equal, round, and reactive to light.  Cardiovascular:     Rate and Rhythm: Normal rate and regular rhythm.     Heart sounds: Normal heart sounds, S1 normal and S2 normal.  Pulmonary:     Effort: Pulmonary effort is normal. No respiratory distress.     Breath sounds: Normal air entry. Wheezing present.     Comments: Diffuse inspiratory and expiratory wheezing heard to auscultation of lungs upon initial assessment.  No acute respiratory distress appreciated to exam.  She is without retractions, prolonged expiration, and tachypnea. Musculoskeletal:  Cervical back: Neck supple.  Lymphadenopathy:     Cervical: No cervical adenopathy.  Skin:    General: Skin is warm and dry.     Capillary Refill: Capillary refill takes less than 2 seconds.     Findings: No rash.  Neurological:     General: No focal deficit present.     Mental Status: She is alert and oriented to person, place, and time. Mental status is at baseline.     Cranial Nerves: No dysarthria or facial asymmetry.  Psychiatric:        Mood and Affect: Mood normal.        Speech: Speech  normal.        Behavior: Behavior normal.        Thought Content: Thought content normal.        Judgment: Judgment normal.      UC Treatments / Results  Labs (all labs ordered are listed, but only abnormal results are displayed) Labs Reviewed  SARS CORONAVIRUS 2 (TAT 6-24 HRS)    EKG   Radiology No results found.  Procedures Procedures (including critical care time)  Medications Ordered in UC Medications  ipratropium-albuterol (DUONEB) 0.5-2.5 (3) MG/3ML nebulizer solution 3 mL (3 mLs Nebulization Given 03/11/22 1115)  methylPREDNISolone sodium succinate (SOLU-MEDROL) 125 mg/2 mL injection 80 mg (80 mg Intramuscular Given 03/11/22 1114)    Initial Impression / Assessment and Plan / UC Course  I have reviewed the triage vital signs and the nursing notes.  Pertinent labs & imaging results that were available during my care of the patient were reviewed by me and considered in my medical decision making (see chart for details).   1.  Acute bronchitis Presentation is consistent with acute bronchitis etiology.  No indication for imaging today based on stable cardiopulmonary exam post DuoNeb, low suspicion for acute pneumonia or pneumothorax, and hemodynamically stable vital signs.  COVID-19 testing is pending.  We will call patient if this is positive.  Quarantine guidelines discussed. Currently on day 3 of symptoms and does qualify for antiviral therapy.   Patient given DuoNeb and 80 mg Solu-Medrol in clinic today for shortness of breath, wheeze, and inflammation of the lungs. Prednisone burst 40 mg for 5 days, guaifenesin, and Tessalon Perles sent to pharmacy for symptomatic relief to be taken as prescribed.  Breath sounds after DuoNeb and Solu-Medrol 80 mg IM completely cleared and patient reports significant subjective improvement in shortness of breath.  May use ibuprofen/tylenol over the counter for body aches, fever/chills, and overall discomfort associated with viral  illness. Nonpharmacologic interventions for symptom relief provided and after visit summary below.  No ibuprofen while taking prednisone due to increased risk of GI bleeding.  Strict ED/urgent care return precautions given.  Patient verbalizes understanding and agreement with plan.  Counseled patient regarding possible side effects and uses of all medications prescribed at today's visit.  Patient verbalizes understanding and agreement with plan.  All questions answered.  Patient discharged from urgent care in stable condition.        Final Clinical Impressions(s) / UC Diagnoses   Final diagnoses:  Acute bronchitis, unspecified organism  Acute cough  Nasal congestion  Shortness of breath     Discharge Instructions      You have a viral upper respiratory infection.  COVID-19 testing is pending. We will call you with results if positive. If your COVID test is positive, you must stay at home until day 6 of symptoms. On day 6, you may go  out into public and go back to work, but you must wear a mask until day 11 of symptoms to prevent spread to others.  Take guaifenesin '1200mg'$   every 12 hours to thin your mucous so that you can get it out of your body easier with coughing/blowing your nose. Drink plenty of water while taking this medication so that it works well in your body (at least 8 cups a day).   Take tessalon pearles every 8 hours as needed for cough.  Continue to use albuterol inhaler as needed every 4-6 hours 1 to 2 puffs for cough, shortness of breath, and wheeze.  Start prednisone burst tomorrow by taking 40 mg of prednisone once daily in the morning with breakfast.  Do not take any ibuprofen when taking prednisone.  Do not take any prednisone orally today since we gave you the steroid injection in the clinic.  You may take tylenol 1,'000mg'$  every 6 hours with food as needed for fever/chills, sore throat, aches/pains, and inflammation associated with viral illness. Take this with  food to avoid stomach upset.    You may do salt water and baking soda gargles every 4 hours as needed for your throat pain.  Please put 1 teaspoon of salt and 1/2 teaspoon of baking soda in 8 ounces of warm water then gargle and spit the water out. You may also put 1 tablespoon of honey in warm water and drink this to soothe your throat.  Place a humidifier in your room at night to help decrease dry air that can irritate your airway and cause you to have a sore throat and cough.  Please try to eat a well-balanced diet while you are sick so that your body gets proper nutrition to heal.  If you develop any new or worsening symptoms, please return.  If your symptoms are severe, please go to the emergency room.  Follow-up with your primary care provider for further evaluation and management of your symptoms as well as ongoing wellness visits.  I hope you feel better!      ED Prescriptions     Medication Sig Dispense Auth. Provider   albuterol (VENTOLIN HFA) 108 (90 Base) MCG/ACT inhaler Inhale 1-2 puffs into the lungs every 6 (six) hours as needed for wheezing or shortness of breath. 6.7 g Joella Prince M, FNP   guaiFENesin (MUCINEX) 600 MG 12 hr tablet Take 2 tablets (1,200 mg total) by mouth 2 (two) times daily. 30 tablet Joella Prince M, FNP   benzonatate (TESSALON) 100 MG capsule Take 1 capsule (100 mg total) by mouth every 8 (eight) hours. 21 capsule Joella Prince M, FNP   predniSONE (DELTASONE) 20 MG tablet Take 2 tablets (40 mg total) by mouth daily for 5 days. 10 tablet Talbot Grumbling, FNP      PDMP not reviewed this encounter.   Talbot Grumbling, [place] 03/11/22 1239

## 2022-03-11 NOTE — ED Triage Notes (Signed)
Onset 3 days ago with congestion. Today Patient has been coughing up blood and having nose bleeds. Having wheezing and SOB. Has h/o Asthma.   Patient states the last time she had chest congestion she had congestion. Patient has h/o cigarette smoking but has switched to [facility] 'n Milds (cigars).

## 2022-03-11 NOTE — Telephone Encounter (Signed)
Message from Roslynn Amble sent at 03/11/2022  8:51 AM EST  Summary: Cough, yellow mucus   The patient called in stating she has had upper respiratory symptoms including a cough, yellow mucus. She did have initial wheezing but she had some leftover prednisone which she took and that helped with that. She is still continuing to have a deep cough and coughing up thick yellow mucus. Please assist patient further         Chief Complaint: cough yellow mucus Symptoms: congestion, audible wheezing, nasal congestion, nosebleed Frequency: 2 days Pertinent Negatives: Patient denies SOB, fever, chest pain, itchy throat Disposition: '[]'$ ED /'[x]'$ Urgent Care (no appt availability in office) / '[]'$ Appointment(In office/virtual)/ '[]'$  [facility]/ '[]'$ Home Care/ '[]'$ Refused Recommended Disposition /'[]'$ [facility]/ '[]'$  Follow-up with PCP Additional Notes: self test negative for Covid  Reason for Disposition  Wheezing is present  Answer Assessment - Initial Assessment Questions 1. ONSET: "When did the cough begin?"      2 days 2. SEVERITY: "How bad is the cough today?"      Frequent cugh 3. SPUTUM: "Describe the color of your sputum" (none, dry cough; clear, white, yellow, green)     yellow 4. HEMOPTYSIS: "Are you coughing up any blood?" If so ask: "How much?" (flecks, streaks, tablespoons, etc.)     *No Answer* 5. DIFFICULTY BREATHING: "Are you having difficulty breathing?" If Yes, ask: "How bad is it?" (e.g., mild, moderate, severe)    - MILD: No SOB at rest, mild SOB with walking, speaks normally in sentences, can lie down, no retractions, pulse < 100.    - MODERATE: SOB at rest, SOB with minimal exertion and prefers to sit, cannot lie down flat, speaks in phrases, mild retractions, audible wheezing, pulse 100-120.    - SEVERE: Very SOB at rest, speaks in single words, struggling to breathe, sitting hunched forward, retractions, pulse > 120      moderate 6. FEVER: "Do you have a fever?"  If Yes, ask: "What is your temperature, how was it measured, and when did it start?"     no 7. CARDIAC HISTORY: "Do you have any history of heart disease?" (e.g., heart attack, congestive heart failure)      *No Answer* 8. LUNG HISTORY: "Do you have any history of lung disease?"  (e.g., pulmonary embolus, asthma, emphysema)     *No Answer* 9. PE RISK FACTORS: "Do you have a history of blood clots?" (or: recent major surgery, recent prolonged travel, bedridden)     *No Answer* 10. OTHER SYMPTOMS: "Do you have any other symptoms?" (e.g., runny nose, wheezing, chest pain)       Audible wheezing, nasal congestion 11. PREGNANCY: "Is there any chance you are pregnant?" "When was your last menstrual period?"       N/a 12. TRAVEL: "Have you traveled out of the country in the last month?" (e.g., travel history, exposures)       N/a  Protocols used: Cough - Acute Productive-A-AH

## 2022-03-11 NOTE — Discharge Instructions (Addendum)
You have a viral upper respiratory infection.  COVID-19 testing is pending. We will call you with results if positive. If your COVID test is positive, you must stay at home until day 6 of symptoms. On day 6, you may go out into public and go back to work, but you must wear a mask until day 11 of symptoms to prevent spread to others.  Take guaifenesin '1200mg'$   every 12 hours to thin your mucous so that you can get it out of your body easier with coughing/blowing your nose. Drink plenty of water while taking this medication so that it works well in your body (at least 8 cups a day).   Take tessalon pearles every 8 hours as needed for cough.  Continue to use albuterol inhaler as needed every 4-6 hours 1 to 2 puffs for cough, shortness of breath, and wheeze.  Start prednisone burst tomorrow by taking 40 mg of prednisone once daily in the morning with breakfast.  Do not take any ibuprofen when taking prednisone.  Do not take any prednisone orally today since we gave you the steroid injection in the clinic.  You may take tylenol 1,'000mg'$  every 6 hours with food as needed for fever/chills, sore throat, aches/pains, and inflammation associated with viral illness. Take this with food to avoid stomach upset.    You may do salt water and baking soda gargles every 4 hours as needed for your throat pain.  Please put 1 teaspoon of salt and 1/2 teaspoon of baking soda in 8 ounces of warm water then gargle and spit the water out. You may also put 1 tablespoon of honey in warm water and drink this to soothe your throat.  Place a humidifier in your room at night to help decrease dry air that can irritate your airway and cause you to have a sore throat and cough.  Please try to eat a well-balanced diet while you are sick so that your body gets proper nutrition to heal.  If you develop any new or worsening symptoms, please return.  If your symptoms are severe, please go to the emergency room.  Follow-up with your primary  care provider for further evaluation and management of your symptoms as well as ongoing wellness visits.  I hope you feel better!

## 2022-03-12 LAB — SARS CORONAVIRUS 2 (TAT 6-24 HRS): SARS Coronavirus 2: NEGATIVE

## 2022-03-23 ENCOUNTER — Other Ambulatory Visit (HOSPITAL_COMMUNITY): Payer: Self-pay

## 2022-03-24 ENCOUNTER — Other Ambulatory Visit (HOSPITAL_COMMUNITY): Payer: Self-pay

## 2022-03-25 ENCOUNTER — Other Ambulatory Visit (HOSPITAL_COMMUNITY): Payer: Self-pay

## 2022-03-25 ENCOUNTER — Other Ambulatory Visit: Payer: Self-pay

## 2022-04-07 ENCOUNTER — Other Ambulatory Visit: Payer: Self-pay

## 2022-04-09 ENCOUNTER — Ambulatory Visit: Payer: Self-pay | Admitting: Pharmacist

## 2022-05-13 ENCOUNTER — Other Ambulatory Visit: Payer: Self-pay

## 2022-05-18 ENCOUNTER — Other Ambulatory Visit: Payer: Self-pay

## 2022-05-18 ENCOUNTER — Ambulatory Visit (HOSPITAL_COMMUNITY)
Admission: EM | Admit: 2022-05-18 | Discharge: 2022-05-18 | Disposition: A | Discharge status: Home / Self Care | Payer: Self-pay | Attending: Family Medicine | Admitting: Family Medicine

## 2022-05-18 ENCOUNTER — Encounter (HOSPITAL_COMMUNITY): Payer: Self-pay

## 2022-05-18 DIAGNOSIS — L03314 Cellulitis of groin: Secondary | ICD-10-CM | POA: Insufficient documentation

## 2022-05-18 LAB — BASIC METABOLIC PANEL
Anion gap: 7 (ref 5–15)
BUN: 9 mg/dL (ref 6–20)
CO2: 28 mmol/L (ref 22–32)
Calcium: 8.9 mg/dL (ref 8.9–10.3)
Chloride: 104 mmol/L (ref 98–111)
Creatinine, Ser: 0.87 mg/dL (ref 0.44–1.00)
GFR, Estimated: >60 mL/min (ref >60)
Glucose, Bld: 88 mg/dL (ref 70–99)
Potassium: 4 mmol/L (ref 3.5–5.1)
Sodium: 139 mmol/L (ref 135–145)

## 2022-05-18 LAB — CBC
HCT: 40.6 % (ref 36.0–46.0)
Hemoglobin: 13.8 g/dL (ref 12.0–15.0)
MCH: 26.2 pg (ref 26.0–34.0)
MCHC: 34 g/dL (ref 30.0–36.0)
MCV: 77 fL — ABNORMAL LOW (ref 80.0–100.0)
Platelets: 388 10*3/uL (ref 150–400)
RBC: 5.27 MIL/uL — ABNORMAL HIGH (ref 3.87–5.11)
RDW: 14.4 % (ref 11.5–15.5)
WBC: 9.2 10*3/uL (ref 4.0–10.5)
nRBC: 0 % (ref 0.0–0.2)

## 2022-05-18 MED ORDER — AMOXICILLIN-POT CLAVULANATE 875-125 MG PO TABS
1.0000 | ORAL_TABLET | Freq: Two times a day (BID) | ORAL | 0 refills | Status: AC
  Filled 2022-05-18: qty 14, 7d supply, fill #0

## 2022-05-18 NOTE — ED Triage Notes (Signed)
Pt is here for feet swelling x a while , boil on the groin x 5days

## 2022-05-18 NOTE — ED Provider Notes (Signed)
[place]    CSN: [id number] Arrival date & time: 05/18/22  2229      History   Chief Complaint Chief Complaint  Patient presents with   Foot Pain    HPI Judy Hanna is a 37 y.o. female.    Foot Pain   Here for a spot on her left pinky toe that has changed colors in the last week.  She has had some swelling off and on in both ankles and feet for some time.  She is also had a swelling or knot in her left inguinal area in the last few days.  No fever or chills at home  She does not have a history of diabetes.  She does have some numbness on the bottom of her left foot sometimes.  There is none on her right foot   Last menstrual cycle was January 10. Past Medical History:  Diagnosis Date   Asthma    BMI 50.0-59.9, adult ([place]) 06/26/2020   Bronchitis    Fibroids     Patient Active Problem List   Diagnosis Date Noted   Visit for wound check 06/25/2021   Fibroid uterus 06/18/2021   S/P myomectomy 06/18/2021   PTSD (post-traumatic stress disorder) 03/10/2021   Major depressive disorder, recurrent episode, moderate with anxious distress ([place]) 03/04/2021   BMI 50.0-59.9, adult ([place]) 06/26/2020   Tobacco abuse 06/26/2020   Fibroids 05/22/2020   Axillary abscess 05/22/2020   Essential hypertension 05/22/2020   Morbid obesity ([place]) 06/05/2019   Tobacco dependence 06/05/2019   Menstrual migraine without status migrainosus, not intractable 06/05/2019   History of abnormal cervical Pap smear 06/05/2019    Past Surgical History:  Procedure Laterality Date   MYOMECTOMY N/A 06/18/2021   Procedure: ABDOMINAL MYOMECTOMY;  Surgeon: Griffin Basil, MD;  Location: [facility];  Service: Gynecology;  Laterality: N/A;   NO PAST SURGERIES      OB History     Gravida  3   Para  0   Term  0   Preterm  0   AB  3   Living         SAB  0   IAB  3   Ectopic  0   Multiple      Live Births               Home Medications    Prior to Admission  medications   Medication Sig Start Date End Date Taking? Authorizing Provider  acetaminophen (TYLENOL) 500 MG tablet Take 2 tablets (1,000 mg total) by mouth every 6 (six) hours as needed for fever or headache. 12/11/21  Yes Elsie Stain, MD  albuterol (VENTOLIN HFA) 108 (90 Base) MCG/ACT inhaler Inhale 1-2 puffs into the lungs every 6 (six) hours as needed for wheezing or shortness of breath. 03/11/22  Yes Talbot Grumbling, FNP  ascorbic acid (VITAMIN C) 500 MG tablet Take 1 tablet (500 mg total) by mouth daily. 12/11/21  Yes Elsie Stain, MD  cyclobenzaprine (FLEXERIL) 10 MG tablet Take 1 tablet (10 mg total) by mouth 2 (two) times daily as needed for muscle spasms. 12/11/21  Yes Marcello Fennel, PA-C  gabapentin (NEURONTIN) 100 MG capsule Take 1 capsule (100 mg total) by mouth 3 (three) times daily. 12/30/21 12/30/22 Yes Patrecia Pour, NP  Multiple Vitamins-Minerals (MULTIVITAMIN WITH MINERALS) tablet Take 1 tablet by mouth 3 (three) times a week.   Yes [provider]  omeprazole (PRILOSEC) 20 MG capsule Take  1 capsule (20 mg total) by mouth 2 (two) times daily before a meal. 01/13/21  Yes Nanavati, Ankit, MD  SUMAtriptan (IMITREX) 50 MG tablet Take 1 tablet at the start of the headache.  May repeat in 2 hours if headache does not resolve.  Max 2 hours / 24-hour. 02/10/21  Yes Gildardo Pounds, NP  zinc gluconate 50 MG tablet Take 50 mg by mouth daily.   Yes [provider]  amoxicillin-clavulanate (AUGMENTIN) 875-125 MG tablet Take 1 tablet by mouth 2 (two) times daily for 7 days. 05/18/22 05/25/22 Yes Melenda Bielak, Gwenlyn Perking, MD  clindamycin (CLINDAGEL) 1 % gel Apply to affected area 2 (two) times daily. 12/10/21   Gabriel Carina, CNM  hydrOXYzine (ATARAX) 10 MG tablet Take 1 tablet (10 mg total) by mouth 3 (three) times daily as needed (may take one tablet as needed twice daily for anxiety, and 3 tablets at bedtime for sleep as needed). 12/30/21   Patrecia Pour, NP   OXcarbazepine (TRILEPTAL) 150 MG tablet Take 1 tablet (150 mg total) by mouth 2 (two) times daily. 12/30/21 03/28/22  Patrecia Pour, NP  simethicone (MYLICON) 80 MG chewable tablet Chew 1 tablet (80 mg total) by mouth 4 (four) times daily as needed for flatulence. 06/20/21   Griffin Basil, MD  Triclosan 0.3 % LIQD Apply liquid topically daily. Use in the shower, follow with clindamycin gel after showering before applying lotion 12/10/21   Gabriel Carina, CNM    Family History Family History  Problem Relation Age of Onset   Hypertension Mother    Hypertension Sister    Hypertension Maternal Grandmother     Social History Social History   Tobacco Use   Smoking status: Every Day    Packs/day: 1.00    Years: 3.00    Total pack years: 3.00    Types: Cigarettes   Smokeless tobacco: Never  Vaping Use   Vaping Use: Never used  Substance Use Topics   Alcohol use: Not Currently    Comment: occisonally    Drug use: Not Currently    Types: Marijuana     Allergies   Patient has no known allergies.   Review of Systems Review of Systems   Physical Exam Triage Vital Signs ED Triage Vitals  Enc Vitals Group     BP 05/18/22 1121 (!) 144/95     Pulse Rate 05/18/22 1121 82     Resp 05/18/22 1121 16     Temp 05/18/22 1121 98.2 F (36.8 C)     Temp Source 05/18/22 1121 Oral     SpO2 05/18/22 1121 98 %     Weight --      Height --      Head Circumference --      Peak Flow --      Pain Score 05/18/22 1120 7     Pain Loc --      Pain Edu? --      Excl. in [place]? --    No data found.  Updated Vital Signs BP (!) 144/95 (BP Location: Right Arm)   Pulse 82   Temp 98.2 F (36.8 C) (Oral)   Resp 16   LMP 05/06/2022   SpO2 98%   Visual Acuity Right Eye Distance:   Left Eye Distance:   Bilateral Distance:    Right Eye Near:   Left Eye Near:    Bilateral Near:     Physical Exam Vitals reviewed.  Constitutional:  General: She is not in acute distress.     Appearance: She is not ill-appearing, toxic-appearing or diaphoretic.  Pulmonary:     Effort: Pulmonary effort is normal.  Skin:    Coloration: Skin is not pale.     Comments: Induration on the medial left inguinal crease proximal and distal to it.  The entire area of induration is about 3 x 2 cm.  There is no fluctuance.  Also there is black discoloration about 1.5 cm in diameter on the lateral aspect of the left pinky toe.  There is no fluctuance there and no drainage  Neurological:     Mental Status: She is alert and oriented to person, place, and time.  Psychiatric:        Behavior: Behavior normal.      UC Treatments / Results  Labs (all labs ordered are listed, but only abnormal results are displayed) Labs Reviewed  BASIC METABOLIC PANEL  CBC    EKG   Radiology No results found.  Procedures Procedures (including critical care time)  Medications Ordered in UC Medications - No data to display  Initial Impression / Assessment and Plan / UC Course  I have reviewed the triage vital signs and the nursing notes.  Pertinent labs & imaging results that were available during my care of the patient were reviewed by me and considered in my medical decision making (see chart for details).        I do not think that the abnormality in the inguinal area is a lymph node but instead is cellulitis.  I do think she has at least a pressure sore on her left pinky toe, and possibly cellulitis there.  Augmentin is sent to treat that, and lab work is done to check her kidney function and her sugar, CBC is done to make sure she does not have an extreme leukocytosis.  We will notify her of any significant abnormalities.  I have also asked her to follow-up with her primary care Final Clinical Impressions(s) / UC Diagnoses   Final diagnoses:  Cellulitis of groin     Discharge Instructions      Take amoxicillin-clavulanate 875 mg--1 tab twice daily with food for 7 days  Use warm  compresses on the sore areas 2-3 times daily     ED Prescriptions     Medication Sig Dispense Auth. Provider   amoxicillin-clavulanate (AUGMENTIN) 875-125 MG tablet Take 1 tablet by mouth 2 (two) times daily for 7 days. 14 tablet Jamani Eley, Gwenlyn Perking, MD      PDMP not reviewed this encounter.   Barrett Henle, MD 05/18/22 1140

## 2022-05-18 NOTE — Discharge Instructions (Addendum)
Take amoxicillin-clavulanate 875 mg--1 tab twice daily with food for 7 days  Use warm compresses on the sore areas 2-3 times daily

## 2022-05-21 ENCOUNTER — Ambulatory Visit (INDEPENDENT_AMBULATORY_CARE_PROVIDER_SITE_OTHER): Payer: No Payment, Other | Admitting: Clinical

## 2022-05-21 DIAGNOSIS — F331 Major depressive disorder, recurrent, moderate: Secondary | ICD-10-CM
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
## 2022-05-26 ENCOUNTER — Other Ambulatory Visit: Payer: Self-pay

## 2022-05-26 ENCOUNTER — Encounter (HOSPITAL_COMMUNITY): Payer: Self-pay | Admitting: Psychiatry

## 2022-05-26 ENCOUNTER — Telehealth (INDEPENDENT_AMBULATORY_CARE_PROVIDER_SITE_OTHER): Payer: No Payment, Other | Admitting: Psychiatry

## 2022-05-26 DIAGNOSIS — F4310 Post-traumatic stress disorder, unspecified: Secondary | ICD-10-CM | POA: Diagnosis not present

## 2022-05-26 DIAGNOSIS — F331 Major depressive disorder, recurrent, moderate: Secondary | ICD-10-CM | POA: Insufficient documentation

## 2022-05-26 DIAGNOSIS — F332 Major depressive disorder, recurrent severe without psychotic features: Secondary | ICD-10-CM

## 2022-05-26 MED ORDER — ESCITALOPRAM OXALATE 10 MG PO TABS
10.0000 mg | ORAL_TABLET | Freq: Every day | ORAL | 0 refills | Status: DC
  Filled 2022-05-26: qty 30, 30d supply, fill #0

## 2022-05-26 MED ORDER — GABAPENTIN 100 MG PO CAPS
100.0000 mg | ORAL_CAPSULE | Freq: Three times a day (TID) | ORAL | 0 refills | Status: DC
  Filled 2022-05-26: qty 90, 30d supply, fill #0

## 2022-05-26 MED ORDER — HYDROXYZINE HCL 10 MG PO TABS
10.0000 mg | ORAL_TABLET | Freq: Three times a day (TID) | ORAL | 0 refills | Status: DC | PRN
  Filled 2022-05-26: qty 270, 54d supply, fill #0

## 2022-05-26 MED ORDER — OXCARBAZEPINE 150 MG PO TABS
150.0000 mg | ORAL_TABLET | Freq: Two times a day (BID) | ORAL | 0 refills | Status: DC
  Filled 2022-05-26: qty 60, 30d supply, fill #0

## 2022-05-26 NOTE — Progress Notes (Signed)
[facility] NP Progress Note  Patient Identification: Judy Hanna MRN:  [id number] Date of Evaluation:  05/26/2022 Referral Source: therapist Chief Complaint:   Chief Complaint   Anxiety; Depression; Follow-up    Visit Diagnosis:    ICD-10-CM   1. PTSD (post-traumatic stress disorder)  F43.10     2. Major depressive disorder, recurrent severe without psychotic features ([place])  F33.2       History of Present Illness:   37 y/o female presents for follow up for depression and anxiety.  Depression is high, no suicidal ideations.  She feels her medications would work if she took them regularly, work interferes at times.  Discussed taking a pill box or leaving them in the [place].  Anxiety fluctuates with stressors.  Her sleep is fair along with her appetite.   Denies any suicidal or homicidal ideation today and audio or visual hallucinations. Her sister continues to be a good support system and plans to visit her soon.  Provided 3 months of medications and let her know I was resigning, thanked her for allowing me to be part of her recovery and she would be assigned to another provider.  Family history: Brother: Bipolar, Schizophrenia, Depression, Anxiety Father: Mood issues "was going through therapy" "he was not diagnosed with anything"  Past Psychiatric History: PTSD, depression,anxiety  Past Medical History:  Past Medical History:  Diagnosis Date   Asthma    BMI 50.0-59.9, adult ([place]) 06/26/2020   Bronchitis    Fibroids     Past Surgical History:  Procedure Laterality Date   MYOMECTOMY N/A 06/18/2021   Procedure: ABDOMINAL MYOMECTOMY;  Surgeon: Griffin Basil, MD;  Location: [facility];  Service: Gynecology;  Laterality: N/A;   NO PAST SURGERIES      Family Psychiatric History: brother with schizoaffective disorder and father with mental health issues, depression and anxiety  Family History:  Family History  Problem Relation Age of Onset   Hypertension Mother    Hypertension  Sister    Hypertension Maternal Grandmother     Social History:   Social History   Socioeconomic History   Marital status: Single    Spouse name: Not on file   Number of children: 0   Years of education: Not on file   Highest education level: Some college, no degree  Occupational History   Occupation: home health care  Tobacco Use   Smoking status: Every Day    Packs/day: 1.00    Years: 3.00    Total pack years: 3.00    Types: Cigarettes   Smokeless tobacco: Never  Vaping Use   Vaping Use: Never used  Substance and Sexual Activity   Alcohol use: Not Currently    Comment: occisonally    Drug use: Not Currently    Types: Marijuana   Sexual activity: Yes    Partners: Female  Other Topics Concern   Not on file  Social History Narrative   ** Merged History Encounter **       Social Determinants of Health   Financial Resource Strain: Not on file  Food Insecurity: Not on file  Transportation Needs: Not on file  Physical Activity: Not on file  Stress: Not on file  Social Connections: Not on file    Additional Social History: moved from [place]:  No Known Allergies  Metabolic Disorder Labs: Lab Results  Component Value Date   HGBA1C 5.6 06/05/2019   No results found for: "PROLACTIN" Lab Results  Component Value Date  CHOL 185 06/05/2019   TRIG 63 06/05/2019   HDL 58 06/05/2019   CHOLHDL 3.2 06/05/2019   LDLCALC 115 (H) 06/05/2019   Lab Results  Component Value Date   TSH 2.317 11/10/2018    Therapeutic Level Labs: No results found for: "LITHIUM" No results found for: "CBMZ" No results found for: "VALPROATE"  Current Medications: Current Outpatient Medications  Medication Sig Dispense Refill   acetaminophen (TYLENOL) 500 MG tablet Take 2 tablets (1,000 mg total) by mouth every 6 (six) hours as needed for fever or headache. 100 tablet 1   albuterol (VENTOLIN HFA) 108 (90 Base) MCG/ACT inhaler Inhale 1-2 puffs into the lungs every 6 (six)  hours as needed for wheezing or shortness of breath. 6.7 g 1   ascorbic acid (VITAMIN C) 500 MG tablet Take 1 tablet (500 mg total) by mouth daily. 90 tablet 2   clindamycin (CLINDAGEL) 1 % gel Apply to affected area 2 (two) times daily. 30 g 0   cyclobenzaprine (FLEXERIL) 10 MG tablet Take 1 tablet (10 mg total) by mouth 2 (two) times daily as needed for muscle spasms. 20 tablet 0   gabapentin (NEURONTIN) 100 MG capsule Take 1 capsule (100 mg total) by mouth 3 (three) times daily. 90 capsule 2   hydrOXYzine (ATARAX) 10 MG tablet Take 1 tablet (10 mg total) by mouth 3 (three) times daily as needed (may take one tablet as needed twice daily for anxiety, and 3 tablets at bedtime for sleep as needed). 150 tablet 2   Multiple Vitamins-Minerals (MULTIVITAMIN WITH MINERALS) tablet Take 1 tablet by mouth 3 (three) times a week.     omeprazole (PRILOSEC) 20 MG capsule Take 1 capsule (20 mg total) by mouth 2 (two) times daily before a meal. 60 capsule 0   OXcarbazepine (TRILEPTAL) 150 MG tablet Take 1 tablet (150 mg total) by mouth 2 (two) times daily. 60 tablet 2   simethicone (MYLICON) 80 MG chewable tablet Chew 1 tablet (80 mg total) by mouth 4 (four) times daily as needed for flatulence. 30 tablet 0   SUMAtriptan (IMITREX) 50 MG tablet Take 1 tablet at the start of the headache.  May repeat in 2 hours if headache does not resolve.  Max 2 hours / 24-hour. 10 tablet 1   Triclosan 0.3 % LIQD Apply liquid topically daily. Use in the shower, follow with clindamycin gel after showering before applying lotion 355 mL 3   zinc gluconate 50 MG tablet Take 50 mg by mouth daily.     No current facility-administered medications for this visit.    Musculoskeletal: Strength & Muscle Tone: WDL Gait & Station: WDL Patient leans: WDL  Psychiatric Specialty Exam: Review of Systems  Psychiatric/Behavioral:  Positive for dysphoric mood. The patient is nervous/anxious.   All other systems reviewed and are negative.    Last menstrual period 05/06/2022.There is no height or weight on file to calculate BMI.  General Appearance: WDL  Eye Contact:  WDL  Speech:  Clear and Coherent  Volume:  Normal  Mood:  Anxious and Depressed  Affect:  WDL  Thought Process:  Coherent  Orientation:  Full (Time, Place, and Person)  Thought Content:  Rumination  Suicidal Thoughts:  No  Homicidal Thoughts:  No  Memory:  Immediate;   Good Recent;   Good Remote;   Good  Judgement:  Good  Insight:  Good  Psychomotor Activity:  Normal  Concentration:  Concentration: Good and Attention Span: Good  Recall:  Good  Fund  of Knowledge:Good  Language: Good  Akathisia:  No  Handed:  Right  AIMS (if indicated):  not done  Assets:  Housing Leisure Time Physical Health Resilience Social Support  ADL's:  Intact  Cognition: WNL  Sleep:  Fair   Screenings: GAD-7    Flowsheet Row Office Visit from 06/25/2021 in [place] for [facility] at [facility] Office Visit from 05/19/2021 in [facility] Office Visit from 04/29/2021 in [facility] from 03/04/2021 in [facility] Office Visit from 02/10/2021 in [facility]  Total GAD-7 Score '7 4 14 13 7      '$ PHQ2-9    [place] Office Visit from 06/25/2021 in [place] [facility] [place] [facility] Office Visit from 05/19/2021 in [facility] [place] [facility] Office Visit from 04/29/2021 in [place] Office Visit from 03/10/2021 in [facility] from 03/04/2021 in [place]  PHQ-2 Total Score '2 1 4 2 6  '$ PHQ-9 Total Score '6 5 14 6 21      '$ [facility] ED from 05/18/2022 in [facility] at [facility] ED from 03/11/2022 in [place]  [facility] at [facility] ED from 01/31/2022 in [facility] Emergency Department at [place] No Risk No Risk No Risk       Assessment and Plan:  Major depressive disorder, recurrent, moderate: -Lexapro 10 mg daily -Trileptal 150 mg BID  General anxiety disorder: -Continue hydroxyzine 10 mg TID PRN Or gabapentin 100 mg TID  Insomnia: -Continue 30 mg of hydroxyzine daily at bedtime as needed  Virtual Visit via Telephone Note  I connected with [name] on 05/26/22 at 11:30 AM EST by telephone and verified that I am speaking with the correct person using two identifiers.  Location: Patient: home Provider: work office   I discussed the limitations, risks, security and privacy concerns of performing an evaluation and management service by telephone and the availability of in person appointments. I also discussed with the patient that there may be a patient responsible charge related to this service. The patient expressed understanding and agreed to proceed.  Follow Up Instructions: Follow up in 3 months   I discussed the assessment and treatment plan with the patient. The patient was provided an opportunity to ask questions and all were answered. The patient agreed with the plan and demonstrated an understanding of the instructions.   The patient was advised to call back or seek an in-person evaluation if the symptoms worsen or if the condition fails to improve as anticipated.  I provided 10 minutes of non-face-to-face time during this encounter.   Waylan Boga, NP    Waylan Boga, NP 1/30/202411:39 AM

## 2022-06-02 ENCOUNTER — Other Ambulatory Visit: Payer: Self-pay

## 2022-06-10 ENCOUNTER — Ambulatory Visit (INDEPENDENT_AMBULATORY_CARE_PROVIDER_SITE_OTHER): Payer: No Payment, Other | Admitting: Clinical

## 2022-06-10 DIAGNOSIS — F331 Major depressive disorder, recurrent, moderate: Secondary | ICD-10-CM

## 2022-06-11 ENCOUNTER — Encounter: Payer: Self-pay | Admitting: Physician Assistant

## 2022-06-11 ENCOUNTER — Ambulatory Visit: Payer: Self-pay | Attending: Physician Assistant | Admitting: Physician Assistant

## 2022-06-11 ENCOUNTER — Other Ambulatory Visit (HOSPITAL_COMMUNITY)
Admission: RE | Admit: 2022-06-11 | Discharge: 2022-06-11 | Disposition: A | Discharge status: Home / Self Care | Payer: Self-pay | Source: Ambulatory Visit | Attending: Physician Assistant | Admitting: Physician Assistant

## 2022-06-11 ENCOUNTER — Other Ambulatory Visit: Payer: Self-pay

## 2022-06-11 VITALS — BP 136/86 | HR 109 | Ht 69.0 in | Wt 395.4 lb

## 2022-06-11 DIAGNOSIS — L84 Corns and callosities: Secondary | ICD-10-CM

## 2022-06-11 DIAGNOSIS — Z113 Encounter for screening for infections with a predominantly sexual mode of transmission: Secondary | ICD-10-CM

## 2022-06-11 DIAGNOSIS — N750 Cyst of Bartholin's gland: Secondary | ICD-10-CM

## 2022-06-11 DIAGNOSIS — L0292 Furuncle, unspecified: Secondary | ICD-10-CM

## 2022-06-11 DIAGNOSIS — R609 Edema, unspecified: Secondary | ICD-10-CM

## 2022-06-11 DIAGNOSIS — J358 Other chronic diseases of tonsils and adenoids: Secondary | ICD-10-CM

## 2022-06-11 DIAGNOSIS — R030 Elevated blood-pressure reading, without diagnosis of hypertension: Secondary | ICD-10-CM

## 2022-06-11 MED ORDER — MUPIROCIN 2 % EX OINT
1.0000 | TOPICAL_OINTMENT | Freq: Two times a day (BID) | CUTANEOUS | 1 refills | Status: DC
  Filled 2022-06-11: qty 22, 11d supply, fill #0

## 2022-06-11 MED ORDER — FUROSEMIDE 20 MG PO TABS
20.0000 mg | ORAL_TABLET | Freq: Every day | ORAL | 1 refills | Status: DC
  Filled 2022-06-11: qty 30, 30d supply, fill #0

## 2022-06-11 NOTE — Patient Instructions (Addendum)
Bartholin's Cyst  A Bartholin's cyst is a fluid-filled sac that forms on a Bartholin's gland. Bartholin's glands are small glands in the folds of skin near the opening of the vagina (labia). This type of cyst causes a bulge or lump near the opening of the vagina. If you have a cyst that is small and not infected, you may be able to take care of it at home. If your cyst gets infected, it may cause pain and your doctor may need to drain it. What are the causes? This condition may be caused by a blocked Bartholin's gland. Germs (bacteria) inside of the cyst can cause an infection. What are the signs or symptoms? A bulge or lump near the opening of the vagina. Discomfort or pain. Redness, swelling, or fluid draining from the area. How is this treated? You may not need treatment if your cyst is not causing symptoms. The cyst can go away on its own with home care. Home care includes hot baths or heat therapy. Large cysts or cysts that are infected may be treated with: Antibiotic medicine. A procedure to drain the fluid. Cysts that keep coming back will need to be drained many times. Your doctor may talk to you about surgery to remove the cyst. Follow these instructions at home: Medicines Take over-the-counter and prescription medicines only as told by your doctor. If you were prescribed an antibiotic medicine, take it as told by your doctor. Do not stop taking it even if you start to feel better. Managing pain and swelling Try sitz baths to help with pain and swelling. A sitz bath is a warm water bath in which the water only comes up to your hips and should cover your buttocks. You may take sitz baths a few times a day. If told, put heat on the affected area as often as needed. Use the heat source that your doctor recommends, such as a moist heat pack or a heating pad. Place a towel between your skin and the heat source. Leave the heat on for 20-30 minutes. Take off the heat if your skin turns  bright red. This is very important. If you cannot feel pain, heat, or cold, you have a greater risk of getting burned. General instructions If your cyst was drained: Follow instructions from your doctor about how to take care of your wound. Use feminine pads to absorb any fluid. Do not push on or squeeze your cyst. Do not have sex until the cyst has gone away or your wound from drainage has healed. Take these steps to help prevent a cyst from returning, and to prevent other cysts from forming: Take a bath or shower once a day. Clean the area around your vagina with mild soap and water when you bathe. Practice safe sex to prevent STIs. Talk with your doctor about how to prevent STIs and which forms of birth control to use. Keep all follow-up visits. Contact a doctor if: You have a fever. You get more redness, swelling, or pain around your cyst. You have fluid, blood, pus, or a bad smell coming from your cyst. You have a cyst that gets larger or a cyst that comes back. Summary A Bartholin's cyst is a fluid-filled sac that forms on a Bartholin's gland. These small glands are found in the folds of skin near the opening of the vagina (labia). This type of cyst causes a bulge or lump near the opening of the vagina. Try sitz baths a few times a day to  help with pain and swelling. Do not push on or squeeze your cyst. This information is not intended to replace advice given to you by your health care provider. Make sure you discuss any questions you have with your health care provider. Document Revised: 09/11/2019 Document Reviewed: 09/11/2019 Elsevier Patient Education  [place] are small areas of thickened skin that form on the top, sides, or tip of a toe. Corns have a cone-shaped core with a point that can press on a nerve below. This causes pain. Calluses are areas of thickened skin that can form anywhere on the body, including the hands, fingers, palms,  soles of the feet, and heels. Calluses are usually larger than corns. What are the causes? Corns and calluses are caused by rubbing (friction) or pressure, such as from shoes that are too tight or do not fit properly. What increases the risk? Corns are more likely to develop in people who have misshapen toes (toe deformities), such as hammer toes. Calluses can form with friction to any area of the skin. They are more likely to develop in people who: Work with their hands. Wear shoes that fit poorly, are too tight, or are high-heeled. Have toe deformities. What are the signs or symptoms? Symptoms of a corn or callus include: A hard growth on the skin. Pain or tenderness under the skin. Redness and swelling. Increased discomfort while wearing tight-fitting shoes, if your feet are affected. If a corn or callus becomes infected, symptoms may include: Redness and swelling that gets worse. Pain. Fluid, blood, or pus draining from the corn or callus. How is this diagnosed? Corns and calluses may be diagnosed based on your symptoms, your medical history, and a physical exam. How is this treated? Treatment for corns and calluses may include: Removing the cause of the friction or pressure. This may involve: Changing your shoes. Wearing shoe inserts (orthotics) or other protective layers in your shoes, such as a corn pad. Wearing gloves. Applying medicine to the skin (topical medicine) to help soften skin in the hardened, thickened areas. Removing layers of dead skin with a file to reduce the size of the corn or callus. Removing the corn or callus with a scalpel or laser. Taking antibiotic medicines, if your corn or callus is infected. Having surgery, if a toe deformity is the cause. Follow these instructions at home:  Take over-the-counter and prescription medicines only as told by your health care provider. If you were prescribed an antibiotic medicine, take it as told by your health care  provider. Do not stop taking it even if your condition improves. Wear shoes that fit well. Avoid wearing high-heeled shoes and shoes that are too tight or too loose. Wear any padding, protective layers, gloves, or orthotics as told by your health care provider. Soak your hands or feet. Then use a file or pumice stone to soften your corn or callus. Do this as told by your health care provider. Check your corn or callus every day for signs of infection. Contact a health care provider if: Your symptoms do not improve with treatment. You have redness or swelling that gets worse. Your corn or callus becomes painful. You have fluid, blood, or pus coming from your corn or callus. You have new symptoms. Get help right away if: You develop severe pain with redness. Summary Corns are small areas of thickened skin that form on the top, sides, or tip of a toe. These can be painful. Calluses  are areas of thickened skin that can form anywhere on the body, including the hands, fingers, palms, and soles of the feet. Calluses are usually larger than corns. Corns and calluses are caused by rubbing (friction) or pressure, such as from shoes that are too tight or do not fit properly. Treatment may include wearing padding, protective layers, gloves, or orthotics as told by your health care provider. This information is not intended to replace advice given to you by your health care provider. Make sure you discuss any questions you have with your health care provider. Document Revised: 08/10/2019 Document Reviewed: 08/10/2019 Elsevier Patient Education  [place]. Edema  Edema is when you have too much fluid in your body or under your skin. Edema may make your legs, feet, and ankles swell. Swelling often happens in looser tissues, such as around your eyes. This is a common condition. It gets more common as you get older. There are many possible causes of edema. These include: Eating too much salt  (sodium). Being on your feet or sitting for a long time. Certain medical conditions, such as: Pregnancy. Heart failure. Liver disease. Kidney disease. Cancer. Hot weather may make edema worse. Edema is usually painless. Your skin may look swollen or shiny. Follow these instructions at home: Medicines Take over-the-counter and prescription medicines only as told by your doctor. Your doctor may prescribe a medicine to help your body get rid of extra water (diuretic). Take this medicine if you are told to take it. Eating and drinking Eat a low-salt (low-sodium) diet as told by your doctor. Sometimes, eating less salt may reduce swelling. Depending on the cause of your swelling, you may need to limit how much fluid you drink (fluid restriction). General instructions Raise the injured area above the level of your heart while you are sitting or lying down. Do not sit still or stand for a long time. Do not wear tight clothes. Do not wear garters on your upper legs. Exercise your legs. This can help the swelling go down. Wear compression stockings as told by your doctor. It is important that these are the right size. These should be prescribed by your doctor to prevent possible injuries. If elastic bandages or wraps are recommended, use them as told by your doctor. Contact a doctor if: Treatment is not working. You have heart, liver, or kidney disease and have symptoms of edema. You have sudden and unexplained weight gain. Get help right away if: You have shortness of breath or chest pain. You cannot breathe when you lie down. You have pain, redness, or warmth in the swollen areas. You have heart, liver, or kidney disease and get edema all of a sudden. You have a fever and your symptoms get worse all of a sudden. These symptoms may be an emergency. Get help right away. Call 911. Do not wait to see if the symptoms will go away. Do not drive yourself to the hospital. Summary Edema is when  you have too much fluid in your body or under your skin. Edema may make your legs, feet, and ankles swell. Swelling often happens in looser tissues, such as around your eyes. Raise the injured area above the level of your heart while you are sitting or lying down. Follow your doctor's instructions about diet and how much fluid you can drink. This information is not intended to replace advice given to you by your health care provider. Make sure you discuss any questions you have with your health care  provider. Document Revised: 12/16/2020 Document Reviewed: 12/16/2020 Elsevier Patient Education  [place].

## 2022-06-11 NOTE — Progress Notes (Signed)
Patient ID: Judy Hanna, female   DOB: October 27, 1985, 37 y.o.   MRN: [id number]     Judy Hanna, is a 37 y.o. female  [id number]  [id number]  DOB - [birth year]  Chief Complaint  Patient presents with   Joint Swelling       Subjective:   Judy Hanna is a 37 y.o. female here today for a follow up visit After ED visit with toe problem and cellulitis in the inguinal region.  BMP and CBC reviewed and essentially normal.  Today she c/o lump in vagina that is not tender on the R introitus.  She also has some small boils in the pubic region that went down after taking augmentin.    The area on her L toe has improved some and is less painful.    She is c/o edema.  She is a smoker.  She is currently trying to lose weight and has started increasing her activity.  No SOB or CP.    From ED note 05/18/2022: I do not think that the abnormality in the inguinal area is a lymph node but instead is cellulitis.  I do think she has at least a pressure sore on her left pinky toe, and possibly cellulitis there.  Augmentin is sent to treat that, and lab work is done to check her kidney function and her sugar, CBC is done to make sure she does not have an extreme leukocytosis.    No problems updated.  ALLERGIES: No Known Allergies  PAST MEDICAL HISTORY: Past Medical History:  Diagnosis Date   Asthma    BMI 50.0-59.9, adult ([facility]) 06/26/2020   Bronchitis    Fibroids     MEDICATIONS AT HOME: Prior to Admission medications   Medication Sig Start Date End Date Taking? Authorizing Provider  acetaminophen (TYLENOL) 500 MG tablet Take 2 tablets (1,000 mg total) by mouth every 6 (six) hours as needed for fever or headache. 12/11/21  Yes Elsie Stain, MD  albuterol (VENTOLIN HFA) 108 (90 Base) MCG/ACT inhaler Inhale 1-2 puffs into the lungs every 6 (six) hours as needed for wheezing or shortness of breath. 03/11/22  Yes Talbot Grumbling, FNP  ascorbic acid (VITAMIN C) 500 MG tablet Take 1  tablet (500 mg total) by mouth daily. 12/11/21  Yes Elsie Stain, MD  clindamycin (CLINDAGEL) 1 % gel Apply to affected area 2 (two) times daily. 12/10/21  Yes Gaylan Gerold R, CNM  cyclobenzaprine (FLEXERIL) 10 MG tablet Take 1 tablet (10 mg total) by mouth 2 (two) times daily as needed for muscle spasms. 12/11/21  Yes Marcello Fennel, PA-C  escitalopram (LEXAPRO) 10 MG tablet Take 1 tablet (10 mg total) by mouth daily. 05/26/22 08/24/22 Yes Patrecia Pour, NP  furosemide (LASIX) 20 MG tablet Take 1 tablet (20 mg total) by mouth daily. X 5 days then prn swelling. 06/11/22  Yes Argentina Donovan, PA-C  gabapentin (NEURONTIN) 100 MG capsule Take 1 capsule (100 mg total) by mouth 3 (three) times daily. 05/26/22 08/24/22 Yes Patrecia Pour, NP  hydrOXYzine (ATARAX) 10 MG tablet Take 1 tablet (10 mg total) by mouth 3 (three) times daily as needed for anxiety (may take one tablet as needed twice daily for anxiety, and 3 tablets at bedtime for sleep as needed). 05/26/22 08/24/22 Yes Patrecia Pour, NP  Multiple Vitamins-Minerals (MULTIVITAMIN WITH MINERALS) tablet Take 1 tablet by mouth 3 (three) times a week.   Yes [provider]  mupirocin ointment (BACTROBAN) 2 %  Apply 1 Application topically 2 (two) times daily. X 1 week prn 06/11/22  Yes Collier Monica M, PA-C  omeprazole (PRILOSEC) 20 MG capsule Take 1 capsule (20 mg total) by mouth 2 (two) times daily before a meal. 01/13/21  Yes Nanavati, Ankit, MD  OXcarbazepine (TRILEPTAL) 150 MG tablet Take 1 tablet (150 mg total) by mouth 2 (two) times daily. 05/26/22 08/24/22 Yes Patrecia Pour, NP  simethicone (MYLICON) 80 MG chewable tablet Chew 1 tablet (80 mg total) by mouth 4 (four) times daily as needed for flatulence. 06/20/21  Yes Griffin Basil, MD  SUMAtriptan (IMITREX) 50 MG tablet Take 1 tablet at the start of the headache.  May repeat in 2 hours if headache does not resolve.  Max 2 hours / 24-hour. 02/10/21  Yes Gildardo Pounds, NP   Triclosan 0.3 % LIQD Apply liquid topically daily. Use in the shower, follow with clindamycin gel after showering before applying lotion 12/10/21  Yes Gaylan Gerold R, CNM  zinc gluconate 50 MG tablet Take 50 mg by mouth daily.   Yes [provider]    ROS: Neg HEENT Neg resp Neg cardiac Neg GI Neg MS Neg psych Neg neuro  Objective:   Vitals:   06/11/22 1439  BP: 136/86  Pulse: (!) 109  SpO2: 97%  Weight: (!) 395 lb 6.4 oz (179.4 kg)  Height: 5' 9"$  (1.753 m)   Exam General appearance : Awake, alert, not in any distress. Speech Clear. Not toxic looking HEENT: Atraumatic and Normocephalic.  Mallampati 3-no current tonsil stones visible and no erythema of throat Neck: Supple, no JVD. No cervical lymphadenopathy.  Chest: Good air entry bilaterally, CTAB.  No rales/rhonchi/wheezing CVS: S1 S2 regular, no murmurs.  GU-R introitus there is a 2cm non tender bartholin's cyst.  Small healing folliculitis X3 in pubic region Extremities: B/L Lower Ext shows 1-2+ edema, both legs are warm to touch. L lateral pinky toe has a small 33m kernel/typical corn.  No ulceration Neurology: Awake alert, and oriented X 3, CN II-XII intact, Non focal Skin: No Rash  Data Review Lab Results  Component Value Date   HGBA1C 5.6 06/05/2019    Assessment & Plan   1. Edema, unspecified type TED stockings, increase activity.  Weight loss and smoking cessation  - furosemide (LASIX) 20 MG tablet; Take 1 tablet (20 mg total) by mouth daily. X 5 days then prn swelling.  Dispense: 30 tablet; Refill: 1  2. Elevated blood-pressure reading without diagnosis of hypertension Check BP OOO and record and bring to next week  3. Tonsil stone Salt water gargles may reduce frequency  4. Corn Showed various corn products and pads on the internet  5. Recurrent boils - mupirocin ointment (BACTROBAN) 2 %; Apply 1 Application topically 2 (two) times daily. X 1 week prn  Dispense: 22 g; Refill: 1  6.  Bartholin cyst Not infected.  Unlikely/not typical for a chancre - [facility]  7. Screening for STDs (sexually transmitted diseases) - Cervicovaginal ancillary only - RPR    Return for appt with Dr JWynetta Emeryat end of month.  The patient was given clear instructions to go to ER or return to medical center if symptoms don't improve, worsen or new problems develop. The patient verbalized understanding. The patient was told to call to get lab results if they haven't heard anything in the next week.      AFreeman Caldron PA-C [facility] [place]  06/11/2022,  3:02 PM

## 2022-06-12 LAB — CERVICOVAGINAL ANCILLARY ONLY
Bacterial Vaginitis (gardnerella): POSITIVE — AB
Candida Glabrata: POSITIVE — AB
Candida Vaginitis: NEGATIVE
Chlamydia: NEGATIVE
Comment: NEGATIVE
Comment: NEGATIVE
Comment: NEGATIVE
Comment: NEGATIVE
Comment: NEGATIVE
Comment: NORMAL
Neisseria Gonorrhea: NEGATIVE
Trichomonas: NEGATIVE

## 2022-06-12 LAB — RPR: RPR Ser Ql: NONREACTIVE

## 2022-06-15 ENCOUNTER — Ambulatory Visit: Payer: Self-pay

## 2022-06-15 ENCOUNTER — Other Ambulatory Visit: Payer: Self-pay

## 2022-06-15 ENCOUNTER — Other Ambulatory Visit: Payer: Self-pay | Admitting: Pharmacist

## 2022-06-15 ENCOUNTER — Other Ambulatory Visit: Payer: Self-pay | Admitting: Physician Assistant

## 2022-06-15 MED ORDER — METRONIDAZOLE 500 MG PO TABS
500.0000 mg | ORAL_TABLET | Freq: Two times a day (BID) | ORAL | 0 refills | Status: DC
  Filled 2022-06-15: qty 14, 7d supply, fill #0

## 2022-06-15 MED ORDER — METRONIDAZOLE 500 MG PO TABS: 500.0000 mg | ORAL_TABLET | Freq: Two times a day (BID) | ORAL | 0 refills | Status: DC

## 2022-06-15 MED ORDER — FLUCONAZOLE 150 MG PO TABS
150.0000 mg | ORAL_TABLET | Freq: Once | ORAL | 0 refills | Status: AC
  Filled 2022-06-15: qty 1, 1d supply, fill #0

## 2022-06-15 NOTE — Telephone Encounter (Signed)
Chief Complaint: Vaginal discharge Symptoms: White, milky discharge w/odor, mild abdominal pain Frequency: Onset discharge 2 days ago Pertinent Negatives: Patient denies other symptoms Disposition: []$ ED /[]$ Urgent Care (no appt availability in office) / []$ Appointment(In office/virtual)/ []$  [facility]/ []$ Home Care/ []$ Refused Recommended Disposition /[]$ [facility]/ [x]$  Follow-up with PCP Additional Notes: Patient had OV on 06/10/22 with Freeman Caldron, PA and had a urine swab done. Patient says the results shows bacterial vaginitis and candida glabrata, which she asked about. Advised it's a type of yeast and that it would require antibiotic to clear up, so I will send this to Dr. Wynetta Emery to review and someone will call back with the recommendation, she verbalized understanding. She says her pharmacy is the [facility] [place] Phone: [phone]  Fax: [phone]      Summary: Lab results   Patient had some labs done recently and the labs showed she had a bacteria infection. Patient wants a nurse to follow up with her to give advice and clarification. Patient always wants to know If there is any medications she should be looking out for. Please follow up with patient.     Reason for Disposition  Bad smelling vaginal discharge  Answer Assessment - Initial Assessment Questions 1. DISCHARGE: "Describe the discharge." (e.g., white, yellow, green, gray, foamy, cottage cheese-like)     White and creamy 2. ODOR: "Is there a bad odor?"     Yes 3. ONSET: "When did the discharge begin?"     2 days ago 4. RASH: "Is there a rash in the genital area?" If Yes, ask: "Describe it." (e.g., redness, blisters, sores, bumps)     Boil that was seen by provider on 06/10/22 5. ABDOMEN PAIN: "Are you having any abdomen pain?" If Yes, ask: "What does it feel like? " (e.g., crampy, dull, intermittent, constant)      A little  crampy 6. ABDOMEN PAIN SEVERITY: If present, ask: "How bad is it?" (e.g., Scale 1-10; mild, moderate, or severe)   - MILD (1-3): Doesn't interfere with normal activities, abdomen soft and not tender to touch.    - MODERATE (4-7): Interferes with normal activities or awakens from sleep, abdomen tender to touch.    - SEVERE (8-10): Excruciating pain, doubled over, unable to do any normal activities. (R/O peritonitis)      3 when it comes, but no abd pain now 7. CAUSE: "What do you think is causing the discharge?" "Have you had the same problem before? What happened then?"     Labs showed bacterial infection 8. OTHER SYMPTOMS: "Do you have any other symptoms?" (e.g., fever, itching, vaginal bleeding, pain with urination, injury to genital area, vaginal foreign body)     No 9. PREGNANCY: "Is there any chance you are pregnant?" "When was your last menstrual period?"     No, LMP around 06/04/22  Protocols used: Vaginal Discharge-A-AH

## 2022-06-15 NOTE — Telephone Encounter (Signed)
Patient made aware that Flagyl  and fluconazole  has been sent to [facility] [address], [place], [place] [address] .

## 2022-06-15 NOTE — Progress Notes (Signed)
   THERAPIST PROGRESS NOTE Virtual Visit via Video Note  I connected with Judy Hanna on 06/10/2022 at  1:00 PM EST by a video enabled telemedicine application and verified that I am speaking with the correct person using two identifiers.  Location: Patient: home Provider: office   I discussed the limitations of evaluation and management by telemedicine and the availability of in person appointments. The patient expressed understanding and agreed to proceed.   Follow Up Instructions: I discussed the assessment and treatment plan with the patient. The patient was provided an opportunity to ask questions and all were answered. The patient agreed with the plan and demonstrated an understanding of the instructions.   The patient was advised to call back or seek an in-person evaluation if the symptoms worsen or if the condition fails to improve as anticipated.    Session Time: 20 minutes  Participation Level: Active  Behavioral Response: CasualAlertEuthymic  Type of Therapy: Individual Therapy  Treatment Goals addressed: client will identify 3 cognitive patterns that beliefs that support depression  ProgressTowards Goals: Progressing  Interventions: CBT  Summary:  Judy Hanna is a 37 y.o. female who presents for the scheduled appointment oriented times five, appropriately dressed, and friendly. Client denied hallucinations and delusions. Client reported she is doing well. Client reported not much has changed. Client reported she has been working and keeping busy. Client reported things at home have been going a little better but not as she would like with her friend/ ex- girlfriend. Client reported she senses that her friend is not giving her as much of a hard time because she is not putting up with her attitude anymore. Client reported she needs to get another psychiatrist because hers has resigned. Client reported she has to work on previous assigned homework to journal. Client  reported no other complaints. Evidence of progress towards goal:  client reported medication compliance 7 days per week.   Suicidal/Homicidal: Nowithout intent/plan  Therapist Response:  Therapist began the appointment asking the client how she has been doing. Therapist used CBT to engage using active listening and positive emotional support. Therapist used CBT to engage and ask the client about medication compliance. Therapist used CBT to engage ask the client abut use of previous discussed coping skills. Therapist used CBT ask the client to identify her progress with frequency of use with coping skills with continued practice in her daily activity.    Therapist assigned the client homework to practice journaling.    Plan: Return again in 3 weeks.  Diagnosis: major depressive disorder, moderate with anxious distress  Collaboration of Care: Patient refused AEB none requested  Patient/Guardian was advised Release of Information must be obtained prior to any record release in order to collaborate their care with an outside provider. Patient/Guardian was advised if they have not already done so to contact the registration department to sign all necessary forms in order for [place] to release information regarding their care.   Consent: Patient/Guardian gives verbal consent for treatment and assignment of benefits for services provided during this visit. Patient/Guardian expressed understanding and agreed to proceed.   [place], LCSW 06/10/2022

## 2022-06-22 ENCOUNTER — Ambulatory Visit: Payer: Self-pay | Admitting: Internal Medicine

## 2022-06-22 ENCOUNTER — Other Ambulatory Visit: Payer: Self-pay

## 2022-06-22 ENCOUNTER — Encounter (HOSPITAL_COMMUNITY): Payer: Self-pay | Admitting: Emergency Medicine

## 2022-06-22 ENCOUNTER — Emergency Department (HOSPITAL_COMMUNITY): Payer: Self-pay

## 2022-06-22 ENCOUNTER — Emergency Department (HOSPITAL_COMMUNITY)
Admission: EM | Admit: 2022-06-22 | Discharge: 2022-06-22 | Disposition: A | Discharge status: Home / Self Care | Payer: Self-pay | Attending: Emergency Medicine | Admitting: Emergency Medicine

## 2022-06-22 DIAGNOSIS — Z79899 Other long term (current) drug therapy: Secondary | ICD-10-CM | POA: Insufficient documentation

## 2022-06-22 DIAGNOSIS — Z7951 Long term (current) use of inhaled steroids: Secondary | ICD-10-CM | POA: Insufficient documentation

## 2022-06-22 DIAGNOSIS — F17210 Nicotine dependence, cigarettes, uncomplicated: Secondary | ICD-10-CM | POA: Insufficient documentation

## 2022-06-22 DIAGNOSIS — R000 Tachycardia, unspecified: Secondary | ICD-10-CM | POA: Insufficient documentation

## 2022-06-22 DIAGNOSIS — U071 COVID-19: Secondary | ICD-10-CM | POA: Insufficient documentation

## 2022-06-22 DIAGNOSIS — J45909 Unspecified asthma, uncomplicated: Secondary | ICD-10-CM | POA: Insufficient documentation

## 2022-06-22 DIAGNOSIS — E860 Dehydration: Secondary | ICD-10-CM | POA: Insufficient documentation

## 2022-06-22 DIAGNOSIS — I10 Essential (primary) hypertension: Secondary | ICD-10-CM | POA: Insufficient documentation

## 2022-06-22 LAB — COMPREHENSIVE METABOLIC PANEL
ALT: 16 U/L (ref 0–44)
AST: 16 U/L (ref 15–41)
Albumin: 3.4 g/dL — ABNORMAL LOW (ref 3.5–5.0)
Alkaline Phosphatase: 51 U/L (ref 38–126)
Anion gap: 5 (ref 5–15)
BUN: 9 mg/dL (ref 6–20)
CO2: 27 mmol/L (ref 22–32)
Calcium: 8.2 mg/dL — ABNORMAL LOW (ref 8.9–10.3)
Chloride: 106 mmol/L (ref 98–111)
Creatinine, Ser: 0.8 mg/dL (ref 0.44–1.00)
GFR, Estimated: >60 mL/min (ref >60)
Glucose, Bld: 103 mg/dL — ABNORMAL HIGH (ref 70–99)
Potassium: 4 mmol/L (ref 3.5–5.1)
Sodium: 138 mmol/L (ref 135–145)
Total Bilirubin: 0.5 mg/dL (ref 0.3–1.2)
Total Protein: 6.7 g/dL (ref 6.5–8.1)

## 2022-06-22 LAB — CBC WITH DIFFERENTIAL/PLATELET
Abs Immature Granulocytes: 0.01 10*3/uL (ref 0.00–0.07)
Basophils Absolute: 0 10*3/uL (ref 0.0–0.1)
Basophils Relative: 1 %
Eosinophils Absolute: 0.3 10*3/uL (ref 0.0–0.5)
Eosinophils Relative: 5 %
HCT: 41.8 % (ref 36.0–46.0)
Hemoglobin: 14.1 g/dL (ref 12.0–15.0)
Immature Granulocytes: 0 %
Lymphocytes Relative: 29 %
Lymphs Abs: 1.8 10*3/uL (ref 0.7–4.0)
MCH: 26.1 pg (ref 26.0–34.0)
MCHC: 33.7 g/dL (ref 30.0–36.0)
MCV: 77.3 fL — ABNORMAL LOW (ref 80.0–100.0)
Monocytes Absolute: 1 10*3/uL (ref 0.1–1.0)
Monocytes Relative: 16 %
Neutro Abs: 3 10*3/uL (ref 1.7–7.7)
Neutrophils Relative %: 49 %
Platelets: 317 10*3/uL (ref 150–400)
RBC: 5.41 MIL/uL — ABNORMAL HIGH (ref 3.87–5.11)
RDW: 15 % (ref 11.5–15.5)
WBC: 6.1 10*3/uL (ref 4.0–10.5)
nRBC: 0 % (ref 0.0–0.2)

## 2022-06-22 LAB — I-STAT BETA HCG BLOOD, ED (MC, WL, AP ONLY): I-stat hCG, quantitative: <5 m[IU]/mL (ref <5)

## 2022-06-22 LAB — RESP PANEL BY RT-PCR (RSV, FLU A&B, COVID)  RVPGX2
Influenza A by PCR: NEGATIVE
Influenza B by PCR: NEGATIVE
Resp Syncytial Virus by PCR: NEGATIVE
SARS Coronavirus 2 by RT PCR: POSITIVE — AB

## 2022-06-22 MED ORDER — PAXLOVID (300/100) 20 X 150 MG & 10 X 100MG PO TBPK
3.0000 | ORAL_TABLET | Freq: Two times a day (BID) | ORAL | 0 refills | Status: AC
  Filled 2022-06-22: qty 30, 5d supply, fill #0

## 2022-06-22 MED ORDER — SODIUM CHLORIDE 0.9 % IV BOLUS
1000.0000 mL | Freq: Once | INTRAVENOUS | Status: AC
  Administered 2022-06-22: 1000 mL via INTRAVENOUS

## 2022-06-22 NOTE — ED Notes (Signed)
Dc instructions and scripts reviewed with pt no questions or concerns at this time. Will follow up with pcp if needed.

## 2022-06-22 NOTE — ED Triage Notes (Signed)
Patient arrives ambulatory by POV reports taking home covid test yesterday and it was positive. Starting Saturday having symptoms. C/o cough, sweats, chest wall pain, back pain. Has been taking tylenol.

## 2022-06-22 NOTE — Discharge Instructions (Addendum)
Your COVID test was positive. Your lab tests and X-ray were reassuring. We have prescribed you an antiviral medication called Paxlovid. Please take this as prescribed. Return if your symptoms worsen or you develop difficulty breathing.

## 2022-06-22 NOTE — ED Provider Notes (Signed)
[facility] Provider Note  CSN: [id number] Arrival date & time: 06/22/22 [id number]  Chief Complaint(s) Covid +  HPI Judy Hanna is a 37 y.o. female history of asthma presenting to the emergency department with cough.  Patient reports associated productive cough with yellow sputum.  She reports fevers, chills, congestion, runny nose.  She reports some chest pain with coughing.  Reports taking Tylenol which improves her symptoms.  Has had sick contacts.  Took home COVID test which was positive.  No nausea, vomiting, diarrhea.  No painful urination.   Past Medical History Past Medical History:  Diagnosis Date   Asthma    BMI 50.0-59.9, adult ([place]) 06/26/2020   Bronchitis    Fibroids    Patient Active Problem List   Diagnosis Date Noted   Major depressive disorder, recurrent severe without psychotic features ([place]) 05/26/2022   Visit for wound check 06/25/2021   Fibroid uterus 06/18/2021   S/P myomectomy 06/18/2021   PTSD (post-traumatic stress disorder) 03/10/2021   BMI 50.0-59.9, adult ([place]) 06/26/2020   Tobacco abuse 06/26/2020   Fibroids 05/22/2020   Axillary abscess 05/22/2020   Essential hypertension 05/22/2020   Morbid obesity ([place]) 06/05/2019   Tobacco dependence 06/05/2019   Menstrual migraine without status migrainosus, not intractable 06/05/2019   History of abnormal cervical Pap smear 06/05/2019   Home Medication(s) Prior to Admission medications   Medication Sig Start Date End Date Taking? Authorizing Provider  nirmatrelvir & ritonavir (PAXLOVID, 300/100,) 20 x 150 MG & 10 x '100MG'$  TBPK Take 3 tablets by mouth 2 (two) times daily for 5 days. 06/22/22 06/27/22 Yes Cristie Hem, MD  acetaminophen (TYLENOL) 500 MG tablet Take 2 tablets (1,000 mg total) by mouth every 6 (six) hours as needed for fever or headache. 12/11/21   Elsie Stain, MD  albuterol (VENTOLIN HFA) 108 (90 Base) MCG/ACT inhaler Inhale 1-2 puffs into the  lungs every 6 (six) hours as needed for wheezing or shortness of breath. 03/11/22   Talbot Grumbling, FNP  ascorbic acid (VITAMIN C) 500 MG tablet Take 1 tablet (500 mg total) by mouth daily. 12/11/21   Elsie Stain, MD  clindamycin (CLINDAGEL) 1 % gel Apply to affected area 2 (two) times daily. 12/10/21   Gabriel Carina, CNM  cyclobenzaprine (FLEXERIL) 10 MG tablet Take 1 tablet (10 mg total) by mouth 2 (two) times daily as needed for muscle spasms. 12/11/21   Marcello Fennel, PA-C  escitalopram (LEXAPRO) 10 MG tablet Take 1 tablet (10 mg total) by mouth daily. 05/26/22 08/24/22  Patrecia Pour, NP  furosemide (LASIX) 20 MG tablet Take 1 tablet (20 mg total) by mouth daily. X 5 days then as needed for swelling. 06/11/22   Argentina Donovan, PA-C  gabapentin (NEURONTIN) 100 MG capsule Take 1 capsule (100 mg total) by mouth 3 (three) times daily. 05/26/22 08/24/22  Patrecia Pour, NP  hydrOXYzine (ATARAX) 10 MG tablet Take 1 tablet (10 mg total) by mouth 3 (three) times daily as needed for anxiety (may take one tablet as needed twice daily for anxiety, and 3 tablets at bedtime for sleep as needed). 05/26/22 08/24/22  Patrecia Pour, NP  metroNIDAZOLE (FLAGYL) 500 MG tablet Take 1 tablet (500 mg total) by mouth 2 (two) times daily. 06/15/22   Ladell Pier, MD  Multiple Vitamins-Minerals (MULTIVITAMIN WITH MINERALS) tablet Take 1 tablet by mouth 3 (three) times a week.    [provider]  mupirocin  ointment (BACTROBAN) 2 % Apply 1 Application topically 2 (two) times daily for 1 week as needed. 06/11/22   Argentina Donovan, PA-C  omeprazole (PRILOSEC) 20 MG capsule Take 1 capsule (20 mg total) by mouth 2 (two) times daily before a meal. 01/13/21   Varney Biles, MD  simethicone (MYLICON) 80 MG chewable tablet Chew 1 tablet (80 mg total) by mouth 4 (four) times daily as needed for flatulence. 06/20/21   Griffin Basil, MD  SUMAtriptan (IMITREX) 50 MG tablet Take 1 tablet at the start  of the headache.  May repeat in 2 hours if headache does not resolve.  Max 2 hours / 24-hour. 02/10/21   Gildardo Pounds, NP  Triclosan 0.3 % LIQD Apply liquid topically daily. Use in the shower, follow with clindamycin gel after showering before applying lotion 12/10/21   Gaylan Gerold R, CNM  zinc gluconate 50 MG tablet Take 50 mg by mouth daily.    [provider]                                                                                                                                    Past Surgical History Past Surgical History:  Procedure Laterality Date   MYOMECTOMY N/A 06/18/2021   Procedure: ABDOMINAL MYOMECTOMY;  Surgeon: Griffin Basil, MD;  Location: [facility];  Service: Gynecology;  Laterality: N/A;   NO PAST SURGERIES     Family History Family History  Problem Relation Age of Onset   Hypertension Mother    Hypertension Sister    Hypertension Maternal Grandmother     Social History Social History   Tobacco Use   Smoking status: Every Day    Packs/day: 1.00    Years: 3.00    Total pack years: 3.00    Types: Cigarettes   Smokeless tobacco: Never  Vaping Use   Vaping Use: Never used  Substance Use Topics   Alcohol use: Not Currently    Comment: occisonally    Drug use: Not Currently    Types: Marijuana   Allergies Patient has no known allergies.  Review of Systems Review of Systems  All other systems reviewed and are negative.   Physical Exam Vital Signs  I have reviewed the triage vital signs BP 115/82   Pulse 81   Temp 98.4 F (36.9 C) (Oral)   Resp 14   Ht '5\' 9"'$  (1.753 m)   Wt (!) 172.4 kg   LMP 06/04/2022   SpO2 98%   BMI 56.12 kg/m  Physical Exam  ED Results and Treatments Labs (all labs ordered are listed, but only abnormal results are displayed) Labs Reviewed  RESP PANEL BY RT-PCR (RSV, FLU A&B, COVID)  RVPGX2 - Abnormal; Notable for the following components:      Result Value   SARS Coronavirus 2 by RT PCR POSITIVE (*)     All other components within normal limits  CBC  WITH DIFFERENTIAL/PLATELET - Abnormal; Notable for the following components:   RBC 5.41 (*)    MCV 77.3 (*)    All other components within normal limits  COMPREHENSIVE METABOLIC PANEL - Abnormal; Notable for the following components:   Glucose, Bld 103 (*)    Calcium 8.2 (*)    Albumin 3.4 (*)    All other components within normal limits  I-STAT BETA HCG BLOOD, ED ([facility], [facility], [facility] ONLY)                                                                                                                          Radiology DG Chest Port 1 View  Result Date: 06/22/2022 CLINICAL DATA:  Cough.  Coronavirus infection. EXAM: PORTABLE CHEST 1 VIEW COMPARISON:  01/31/2021 FINDINGS: The heart size and mediastinal contours are within normal limits. Both lungs are clear. The visualized skeletal structures are unremarkable. IMPRESSION: No active disease. Electronically Signed   By: Nelson Chimes M.D.   On: 06/22/2022 10:07    Pertinent labs & imaging results that were available during my care of the patient were reviewed by me and considered in my medical decision making (see MDM for details).  Medications Ordered in ED Medications  sodium chloride 0.9 % bolus 1,000 mL (0 mLs Intravenous Stopped 06/22/22 1122)                                                                                                                                     Procedures Procedures  (including critical care time)  Medical Decision Making / ED Course   MDM:  37 year old female presenting to the emergency department with flulike symptoms.  Patient well-appearing, vital signs notable for mild tachycardia.  Physical exam overall reassuring.  Suspect symptoms explained by COVID-19 infection.  Patient does appear mildly dehydrated, will give small amount of IV fluids.  Patient has Tegretol on her medication list but reports she is not taking this medication.  Since she has not  taken this medication, for lab tests are reassuring will discharge on Paxlovid.  Patient having chest pain, productive cough, will check chest x-ray, EKG.  Extremely low concern for ACS, chest pain is worse with coughing and seems musculoskeletal in nature.  Also very low concern for pulmonary embolism given constellation of symptoms most consistent with COVID-19 or upper respiratory tract infection.    Clinical Course as of 06/22/22  [place] Jun 22, 2022  1010 CXR negative.  [WS]  1123 COVID-19 positive. Will prescribe paxlovid. Will discharge patient to home. All questions answered. Patient comfortable with plan of discharge. Return precautions discussed with patient and specified on the after visit summary.  [WS]    Clinical Course User Index [WS] Cristie Hem, MD     Additional history obtained: -External records from outside source obtained and reviewed including: Chart review including previous notes, labs, imaging, consultation notes including old psychiatrist records   Lab Tests: -I ordered, reviewed, and interpreted labs.   The pertinent results include:   Labs Reviewed  RESP PANEL BY RT-PCR (RSV, FLU A&B, COVID)  RVPGX2 - Abnormal; Notable for the following components:      Result Value   SARS Coronavirus 2 by RT PCR POSITIVE (*)    All other components within normal limits  CBC WITH DIFFERENTIAL/PLATELET - Abnormal; Notable for the following components:   RBC 5.41 (*)    MCV 77.3 (*)    All other components within normal limits  COMPREHENSIVE METABOLIC PANEL - Abnormal; Notable for the following components:   Glucose, Bld 103 (*)    Calcium 8.2 (*)    Albumin 3.4 (*)    All other components within normal limits  I-STAT BETA HCG BLOOD, ED ([facility], [facility], [facility] ONLY)    Notable for positive COVID  EKG   EKG Interpretation  Date/Time:  Monday June 22 2022 10:19:45 EST Ventricular Rate:  83 PR Interval:  182 QRS Duration: 66 QT Interval:  359 QTC  Calculation: 422 R Axis:   18 Text Interpretation: Sinus rhythm Confirmed by Garnette Gunner [phone]) on 06/22/2022 10:42:07 AM         Imaging Studies ordered: I ordered imaging studies including CXR On my interpretation imaging demonstrates clear lungs I independently visualized and interpreted imaging. I agree with the radiologist interpretation   Medicines ordered and prescription drug management: Meds ordered this encounter  Medications   sodium chloride 0.9 % bolus 1,000 mL   nirmatrelvir & ritonavir (PAXLOVID, 300/100,) 20 x 150 MG & 10 x '100MG'$  TBPK    Sig: Take 3 tablets by mouth 2 (two) times daily for 5 days.    Dispense:  30 tablet    Refill:  0    -I have reviewed the patients home medicines and have made adjustments as needed    Cardiac Monitoring: The patient was maintained on a cardiac monitor.  I personally viewed and interpreted the cardiac monitored which showed an underlying rhythm of: NSR  Social Determinants of Health:  Diagnosis or treatment significantly limited by social determinants of health: obesity   Reevaluation: After the interventions noted above, I reevaluated the patient and found that they have improved  Co morbidities that complicate the patient evaluation  Past Medical History:  Diagnosis Date   Asthma    BMI 50.0-59.9, adult ([place]) 06/26/2020   Bronchitis    Fibroids       Dispostion: Disposition decision including need for hospitalization was considered, and patient discharged from emergency department.    Final Clinical Impression(s) / ED Diagnoses Final diagnoses:  COVID-19     This chart was dictated using voice recognition software.  Despite best efforts to proofread,  errors can occur which can change the documentation meaning.    Cristie Hem, MD 06/22/22 [phone]

## 2022-07-07 ENCOUNTER — Telehealth: Payer: Self-pay | Admitting: Obstetrics and Gynecology

## 2022-07-07 NOTE — Telephone Encounter (Signed)
Patient called to make an appointment with Dr Elgie Congo,  I offered her his first open slot that was on May 1,  patient didn't want to take the appt said she would call back.

## 2022-07-13 ENCOUNTER — Ambulatory Visit: Payer: Self-pay | Admitting: Internal Medicine
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
## 2022-07-27 ENCOUNTER — Other Ambulatory Visit (HOSPITAL_COMMUNITY)
Admission: RE | Admit: 2022-07-27 | Discharge: 2022-07-27 | Disposition: A | Discharge status: Home / Self Care | Payer: Medicaid Other | Source: Ambulatory Visit | Attending: Obstetrics and Gynecology | Admitting: Obstetrics and Gynecology

## 2022-07-27 ENCOUNTER — Ambulatory Visit: Payer: Self-pay | Admitting: *Deleted

## 2022-07-27 ENCOUNTER — Ambulatory Visit (INDEPENDENT_AMBULATORY_CARE_PROVIDER_SITE_OTHER): Payer: No Payment, Other | Admitting: Psychiatry

## 2022-07-27 ENCOUNTER — Other Ambulatory Visit: Payer: Self-pay

## 2022-07-27 ENCOUNTER — Encounter (HOSPITAL_COMMUNITY): Payer: Self-pay | Admitting: Psychiatry

## 2022-07-27 ENCOUNTER — Ambulatory Visit (INDEPENDENT_AMBULATORY_CARE_PROVIDER_SITE_OTHER): Payer: Self-pay | Admitting: Obstetrics and Gynecology

## 2022-07-27 VITALS — BP 161/112 | HR 87 | Wt 398.1 lb

## 2022-07-27 DIAGNOSIS — F419 Anxiety disorder, unspecified: Secondary | ICD-10-CM | POA: Diagnosis not present

## 2022-07-27 DIAGNOSIS — F4310 Post-traumatic stress disorder, unspecified: Secondary | ICD-10-CM | POA: Diagnosis not present

## 2022-07-27 DIAGNOSIS — Z113 Encounter for screening for infections with a predominantly sexual mode of transmission: Secondary | ICD-10-CM | POA: Diagnosis not present

## 2022-07-27 DIAGNOSIS — F332 Major depressive disorder, recurrent severe without psychotic features: Secondary | ICD-10-CM | POA: Diagnosis not present

## 2022-07-27 DIAGNOSIS — N764 Abscess of vulva: Secondary | ICD-10-CM

## 2022-07-27 MED ORDER — ESCITALOPRAM OXALATE 10 MG PO TABS
10.0000 mg | ORAL_TABLET | Freq: Every day | ORAL | 2 refills | Status: DC
  Filled 2022-07-27: qty 30, 30d supply, fill #0

## 2022-07-27 MED ORDER — SULFAMETHOXAZOLE-TRIMETHOPRIM 800-160 MG PO TABS
1.0000 | ORAL_TABLET | Freq: Two times a day (BID) | ORAL | 1 refills | Status: DC
  Filled 2022-07-27: qty 14, 7d supply, fill #0

## 2022-07-27 NOTE — Patient Instructions (Signed)
Thank you for attending your appointment today.  -- START Lexapro 10 mg daily; make sure to take this medication every day to assess effectiveness -- Continue other medications as prescribed.  Please do not make any changes to medications without first discussing with your provider. If you are experiencing a psychiatric emergency, please call 911 or present to your nearest emergency department. Additional crisis, medication management, and therapy resources are included below.  [facility]  [address] [place], [place] [address] [phone] WALK-IN URGENT CARE 24/7 FOR ANYONE [address] [place], [place] Fax: [phone] [email or web address] *Interpreters available *Accepts all insurance and uninsured for Urgent Care needs *Accepts Medicaid and uninsured for outpatient treatment (below)      ONLY FOR [facility]  Below:    Outpatient New Patient Assessment/Therapy Walk-ins:        Monday -Thursday 8am until slots are full.        Every Friday 1pm-4pm  (first come, first served)                   New Patient Psychiatry/Medication Management        Monday-Friday 8am-11am (first come, first served)               For all walk-ins we ask that you arrive by 7:15am, because patients will be seen in the order of arrival.

## 2022-07-27 NOTE — Progress Notes (Unsigned)
GYNECOLOGY VISIT  Patient name: Judy Hanna MRN [id number]  Date of birth: February 05, 1986 Chief Complaint:   Gynecologic Exam   History:  Jarin Holes is a 37 y.o. G3P0030 being seen today for labial pain. Noted to have had 'boils' on her upper mons before but now has area on lower right labia that hurts. Initially "came to a head" and then ruptured and now seems to have reformed. No fever or chills. Does nair for hair removal typically.    Would also like STI testing.   Past Medical History:  Diagnosis Date   Asthma    BMI 50.0-59.9, adult 06/26/2020   Bronchitis    Depression    Fibroids    PTSD (post-traumatic stress disorder)     Past Surgical History:  Procedure Laterality Date   MYOMECTOMY N/A 06/18/2021   Procedure: ABDOMINAL MYOMECTOMY;  Surgeon: Griffin Basil, MD;  Location: [facility];  Service: Gynecology;  Laterality: N/A;   NO PAST SURGERIES      The following portions of the patient's history were reviewed and updated as appropriate: allergies, current medications, past family history, past medical history, past social history, past surgical history and problem list.   Health Maintenance:   Last pap     Component Value Date/Time   DIAGPAP  09/04/2019 1106    - Negative for Intraepithelial Lesions or Malignancy (NILM)   DIAGPAP - Benign reactive/reparative changes 09/04/2019 1106   [place] Negative 09/04/2019 1106   ADEQPAP  09/04/2019 1106    Satisfactory for evaluation; transformation zone component PRESENT.    Last mammogram: n//a   Review of Systems:  Pertinent items are noted in HPI. Comprehensive review of systems was otherwise negative.   Objective:  Physical Exam BP (!) 161/112   Pulse 87   Wt (!) 398 lb 1.6 oz (180.6 kg)   BMI 58.79 kg/m    Physical Exam Vitals and nursing note reviewed. Exam conducted with a chaperone present.  Constitutional:      Appearance: Normal appearance.  HENT:     Head: Normocephalic and atraumatic.   Pulmonary:     Effort: Pulmonary effort is normal.     Breath sounds: Normal breath sounds.  Genitourinary:    General: Normal vulva.     Exam position: Lithotomy position.     Labia:        Right: Tenderness present.      Vagina: Normal.     Cervix: Normal.     Comments: Right lower inner labia majora with erythematous and indurated region with small opening, tender to palpation with spontaneous drainage  Right lower outer labia majora with small erythematous pustule without spontaneous drainage Healed scars on mons  Skin:    General: Skin is warm and dry.  Neurological:     General: No focal deficit present.     Mental Status: She is alert.  Psychiatric:        Mood and Affect: Mood normal.        Behavior: Behavior normal.        Thought Content: Thought content normal.        Judgment: Judgment normal.         Assessment & Plan:   1. Vulvar abscess Bactrim x 7 day for vulvar abscess. Avoid hair removal at this time. Discussed general vulvar hygiene practices. Will return in 1-2 weeks for reassessment, sooner if worsens or systemic signs of infection arise.  - sulfamethoxazole-trimethoprim (BACTRIM DS) 800-160 MG tablet; Take 1  tablet by mouth 2 (two) times daily.  Dispense: 14 tablet; Refill: 1  2. Screening examination for STD (sexually transmitted disease) Vaginitis swab and serum testing obtained - Hepatitis B Surface AntiGEN - Hepatitis C Antibody - RPR - HIV antibody (with reflex) - Cervicovaginal ancillary only   Routine preventative health maintenance measures emphasized.  Darliss Cheney, MD [facility] [place]

## 2022-07-27 NOTE — Telephone Encounter (Signed)
rash and vaginal swelling, no appt til may   Rash on face and chest since 3/30, spoke with doctor before but still experiencing  vaginal area swelling/pain/ next available appointment until Aug 26, 2022     Chief Complaint: Rash Symptoms: Red raised "Tiny bumps" on face, chest,  "Maybe some under  breasts  and back, not sure." Frequency: Saturday Pertinent Negatives: Patient denies fever, pain, no new meds, cosmetics, detergents. No swelling Disposition: [] ED /[] Urgent Care (no appt availability in office) / [] Appointment(In office/virtual)/ []  [facility]/ [] Home Care/ [] Refused Recommended Disposition /[x] [place] [facility]/ []  Follow-up with PCP Additional Notes:  No availability, advised [facility], states will follow disposition. Also reports she has appt with OB/GYN this afternoon who may be able to advise her on this issue. Care advise provided. Pt verbalizes understanding.  Reason for Disposition  [1] Pimples (localized) AND [place].Ok ] no improvement after using Care Advice  Answer Assessment - Initial Assessment Questions 1. APPEARANCE of RASH: "Describe the rash."      Fine red raised rash 2. LOCATION: "Where is the rash located?"      Face and chest 3. NUMBER: "How many spots are there?"      Many 4. SIZE: "How big are the spots?" (Inches, centimeters or compare to size of a coin)      Tiny 5. ONSET: "When did the rash start?"      Saturday 6. ITCHING: "Does the rash itch?" If Yes, ask: "How bad is the itch?"  (Scale 0-10; or none, mild, moderate, severe)     Mild at times 7. PAIN: "Does the rash hurt?" If Yes, ask: "How bad is the pain?"  (Scale 0-10; or none, mild, moderate, severe)    - NONE (0): no pain    - MILD (1-3): doesn't interfere with normal activities     - MODERATE (4-7): interferes with normal activities or awakens from sleep     - SEVERE (8-10): excruciating pain, unable to do any normal activities     No 8. OTHER SYMPTOMS: "Do you have any  other symptoms?" (e.g., fever)     No  Protocols used: Rash or Redness - Localized-A-AH

## 2022-07-28 LAB — RPR: RPR Ser Ql: NONREACTIVE

## 2022-07-28 LAB — HEPATITIS C ANTIBODY: Hep C Virus Ab: NONREACTIVE

## 2022-07-28 LAB — HIV ANTIBODY (ROUTINE TESTING W REFLEX): HIV Screen 4th Generation wRfx: NONREACTIVE

## 2022-07-28 LAB — HEPATITIS B SURFACE ANTIGEN: Hepatitis B Surface Ag: NEGATIVE

## 2022-07-29 ENCOUNTER — Encounter: Payer: Self-pay | Admitting: Physician Assistant

## 2022-07-29 LAB — CERVICOVAGINAL ANCILLARY ONLY
Bacterial Vaginitis (gardnerella): POSITIVE — AB
Candida Glabrata: NEGATIVE
Candida Vaginitis: NEGATIVE
Chlamydia: NEGATIVE
Comment: NEGATIVE
Comment: NEGATIVE
Comment: NEGATIVE
Comment: NEGATIVE
Comment: NEGATIVE
Comment: NORMAL
Neisseria Gonorrhea: NEGATIVE
Trichomonas: NEGATIVE

## 2022-07-30 ENCOUNTER — Other Ambulatory Visit: Payer: Self-pay | Admitting: Obstetrics and Gynecology

## 2022-07-30 DIAGNOSIS — N760 Acute vaginitis: Secondary | ICD-10-CM

## 2022-07-30 MED ORDER — METRONIDAZOLE 500 MG PO TABS
500.0000 mg | ORAL_TABLET | Freq: Two times a day (BID) | ORAL | 0 refills | Status: AC
  Filled 2022-07-30: qty 14, 7d supply, fill #0

## 2022-07-31 ENCOUNTER — Other Ambulatory Visit: Payer: Self-pay

## 2022-08-03 ENCOUNTER — Other Ambulatory Visit: Payer: Self-pay

## 2022-08-06 ENCOUNTER — Other Ambulatory Visit: Payer: Self-pay | Admitting: Pharmacist

## 2022-08-07 ENCOUNTER — Ambulatory Visit: Payer: Self-pay | Attending: Family Medicine | Admitting: Pharmacist

## 2022-08-07 ENCOUNTER — Encounter: Payer: Self-pay | Admitting: Pharmacist

## 2022-08-07 ENCOUNTER — Other Ambulatory Visit: Payer: Self-pay

## 2022-08-07 ENCOUNTER — Other Ambulatory Visit: Payer: Self-pay | Admitting: Pharmacist

## 2022-08-07 VITALS — BP 141/87 | HR 87

## 2022-08-07 DIAGNOSIS — R030 Elevated blood-pressure reading, without diagnosis of hypertension: Secondary | ICD-10-CM

## 2022-08-07 MED ORDER — LOSARTAN POTASSIUM 25 MG PO TABS
25.0000 mg | ORAL_TABLET | Freq: Every day | ORAL | 0 refills | Status: DC
  Filled 2022-08-07: qty 90, 90d supply, fill #0

## 2022-08-07 NOTE — Progress Notes (Signed)
   S:     No chief complaint on file.  37 y.o. female who presents for hypertension evaluation, education, and management.  PMH is significant for elevated BP without HTN diagnosis, obesity, and tobacco use.   Patient was referred and last seen by Primary Care Provider, Dr. Laural Benes, on 02/26/2022. Patient was seen by Georgian Co on 06/11/2022 for joint swelling at that visit BP was 136/86. She was seen by OBGYN on 07/27/2022 where BP was elevated to 161/112.  Today, patient arrives in good spirits and presents without assistance. Denies dizziness, headache, blurred vision. She endorses swelling at lower ankles.   Family/Social history:  Family history: HTN in sister, mother Social history: tobacco use  Current antihypertensives include: none  Antihypertensives tried in the past include: none  Reported home BP readings: not taking  Patient reported dietary habits:  Patient reports she eats out a lot and does not restrict sodium Consumes 1-2 cups of coffee, but has decreased her caffeine intake  Patient-reported exercise habits: walking around work  ASCVD risk factors include: tobacco use  O:  Vitals:   08/07/22 1102  BP: (!) 141/87  Pulse: 87    Last 3 Office BP readings: BP Readings from Last 3 Encounters:  08/07/22 (!) 141/87  07/27/22 (!) 161/112  06/22/22 115/82    BMET    Component Value Date/Time   NA 138 06/22/2022 1015   NA 141 12/04/2020 1640   K 4.0 06/22/2022 1015   CL 106 06/22/2022 1015   CO2 27 06/22/2022 1015   GLUCOSE 103 (H) 06/22/2022 1015   BUN 9 06/22/2022 1015   BUN 7 12/04/2020 1640   CREATININE 0.80 06/22/2022 1015   CALCIUM 8.2 (L) 06/22/2022 1015   GFRNONAA >60 06/22/2022 1015   GFRAA 122 06/05/2019 1131    Renal function: CrCl cannot be calculated (Patient's most recent lab result is older than the maximum 21 days allowed.).  Clinical ASCVD: No   A/P: Patient not on any antihypertensive medications. BP goal < 130/80 mmHg.   -Started losartan 25 mg daily. Considered amlodipine but avoided due to ankle swelling. Considered hydrochlorothiazide but will continue with PRN furosemide for now. -Patient educated on purpose, proper use, and potential adverse effects of losartan.  -F/u labs ordered - none. Anticipate BMP at PCP f/w next month. -Counseled on lifestyle modifications for blood pressure control including reduced dietary sodium, increased exercise, adequate sleep.  Results reviewed and written information provided.    Written patient instructions provided. Patient verbalized understanding of treatment plan.  Total time in face to face counseling 20 minutes.    Follow-up:  Pharmacist PRN. PCP clinic visit on 08/31/2022.   Georga Hacking, Pharm.D PGY1 Pharmacy Resident 08/07/2022 12:30 PM

## 2022-08-07 NOTE — Progress Notes (Signed)
Patient outreached by Coralyn Helling, PharmD Candidate on 08/06/22 to discuss hypertension. BP at last visit to OBGYN was 161/112. She reports some swelling in legs/ankles but denies headaches, dizziness, or vision changes. Does not currently do any exercise but wants to start walking more. Endorses that she is overweight and feels like she needs to make lifestyle changes.   Patient does not have an automated home blood pressure machine. 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   Medication review was performed. She is taking medications as prescribed but does report missed doses frequently. She states that she does not have trouble remembering to take meds but is sometimes so busy that she doesn't have time to take them. Patient is not on any antihypertensive medications.   The following barriers to adherence were noted:  - They do have cost concerns. Sometimes struggles with cost but right now is okay.  - They do have transportation concerns. Does not drive and sometimes has a hard time getting to the doctor or pharmacy.  - They do not need assistance obtaining refills.  - They do occasionally forget to take some of their prescribed medications.  - They do not feel like one/some of their medications make them feel poorly.  - They do not have questions or concerns about their medications.  - They do have follow up scheduled with their primary care provider/cardiologist.   The following interventions were completed:  - Medications were reviewed  - Patient was educated on goal blood pressures and long term health implications of elevated blood pressure  - Patient was educated on use of adherence strategies, like a pill box or alarms  - Patient was educated on how to access home blood pressure machine  - Patient was counseled on lifestyle modifications to improve blood pressure, including decreased salt intake and increased exercise.  - Patient was  advised to call and reschedule the appointment she missed with Franky Macho at [facility] about her HTN.   The patient has follow up scheduled:  PCP: Dr. Laural Benes at [facility] in May  Her next scheduled appt is with her obgyn in a few days   Coralyn Helling, PharmD Candidate   Alvino Blood, PharmD, BCACP, CPP

## 2022-08-11 ENCOUNTER — Encounter: Payer: Self-pay | Admitting: Obstetrics and Gynecology

## 2022-08-11 ENCOUNTER — Other Ambulatory Visit: Payer: Self-pay

## 2022-08-11 ENCOUNTER — Ambulatory Visit (INDEPENDENT_AMBULATORY_CARE_PROVIDER_SITE_OTHER): Payer: Self-pay | Admitting: Obstetrics and Gynecology

## 2022-08-11 VITALS — BP 125/76 | HR 79 | Wt 394.7 lb

## 2022-08-11 DIAGNOSIS — B9689 Other specified bacterial agents as the cause of diseases classified elsewhere: Secondary | ICD-10-CM

## 2022-08-11 DIAGNOSIS — N760 Acute vaginitis: Secondary | ICD-10-CM

## 2022-08-11 DIAGNOSIS — N764 Abscess of vulva: Secondary | ICD-10-CM

## 2022-08-11 DIAGNOSIS — D259 Leiomyoma of uterus, unspecified: Secondary | ICD-10-CM

## 2022-08-11 MED ORDER — METRONIDAZOLE 0.75 % VA GEL
1.0000 | Freq: Every day | VAGINAL | 1 refills | Status: DC
  Filled 2022-08-11: qty 70, 7d supply, fill #0

## 2022-08-11 NOTE — Progress Notes (Signed)
    GYNECOLOGY VISIT  Patient name: Judy Hanna MRN [id number]  Date of birth: January 12, 1986 Chief Complaint:   Wound Check  History:  Barry Culverhouse is a 37 y.o. G3P0030 being seen today for follow up of vular abscess s/p bactrim.  Has noted significant improvement in vulvar bump.   Concerned about recurrent BV. Did not pick up flagyl as she has had multiple BV positive swabs and wondering if there was vaginal treatment available. Uses peppermint Dr. Alphia Moh soap, previously used Ellis with fewer issues.   Reports having passed a blood clot yesterday after having some pain, was not on her menses. Would like to follow up w/ Gyn who did myomectomy.   Has seen BP docs regarding BP control and LE edema. Feels edemas not as bad.   Past Medical History:  Diagnosis Date   Asthma    BMI 50.0-59.9, adult 06/26/2020   Bronchitis    Depression    Fibroids    PTSD (post-traumatic stress disorder)     Past Surgical History:  Procedure Laterality Date   MYOMECTOMY N/A 06/18/2021   Procedure: ABDOMINAL MYOMECTOMY;  Surgeon: Warden Fillers, MD;  Location: [facility] OR;  Service: Gynecology;  Laterality: N/A;   NO PAST SURGERIES      The following portions of the patient's history were reviewed and updated as appropriate: allergies, current medications, past family history, past medical history, past social history, past surgical history and problem list.   Health Maintenance:   Last pap     Component Value Date/Time   DIAGPAP  09/04/2019 1106    - Negative for Intraepithelial Lesions or Malignancy (NILM)   DIAGPAP - Benign reactive/reparative changes 09/04/2019 1106   HPVHIGH Negative 09/04/2019 1106   ADEQPAP  09/04/2019 1106    Satisfactory for evaluation; transformation zone component PRESENT.    High Risk HPV: Positive  Adequacy:  Satisfactory for evaluation, transformation zone component PRESENT  Diagnosis:  Atypical squamous cells of undetermined significance (ASC-US)  Last  mammogram: n/a   Review of Systems:  Pertinent items are noted in HPI. Comprehensive review of systems was otherwise negative.   Objective:  Physical Exam BP 125/76   Pulse 79   Wt (!) 394 lb 11.2 oz (179 kg)   LMP 08/02/2022 (Approximate)   BMI 58.29 kg/m    Physical Exam   Labs and Imaging No results found.     Assessment & Plan:   1. Vulvar abscess Resolved   2. BV (bacterial vaginosis) Discussed using unscented soap for vulvar cleaning and BV may be due to fragrance. D/c oral flagyl and rx for metrogel given. Offered use of boric acid as well if not cleared with metrogel.  - metroNIDAZOLE (METROGEL) 0.75 % vaginal gel; Place 1 Applicatorful vaginally at bedtime. Apply one applicatorful to vagina at bedtime for 5 days  Dispense: 70 g; Refill: 1  3. Uterine leiomyoma, unspecified location Episode of vaginal bleeding and history of myomectomy. Pelvic US ordered and will follow up with primary GYN.  - US PELVIC COMPLETE WITH TRANSVAGINAL; Future  Routine preventative health maintenance measures emphasized.  Lorriane Shire, MD Minimally Invasive Gynecologic [facility], [facility]

## 2022-08-18 ENCOUNTER — Other Ambulatory Visit: Payer: Self-pay

## 2022-08-31 ENCOUNTER — Ambulatory Visit: Payer: Self-pay | Attending: Internal Medicine | Admitting: Internal Medicine

## 2022-08-31 ENCOUNTER — Other Ambulatory Visit (HOSPITAL_COMMUNITY)
Admission: RE | Admit: 2022-08-31 | Discharge: 2022-08-31 | Disposition: A | Discharge status: Home / Self Care | Payer: Medicaid Other | Source: Ambulatory Visit | Attending: Obstetrics and Gynecology | Admitting: Obstetrics and Gynecology

## 2022-08-31 ENCOUNTER — Ambulatory Visit: Payer: Self-pay | Admitting: Obstetrics and Gynecology

## 2022-08-31 ENCOUNTER — Encounter: Payer: Self-pay | Admitting: Internal Medicine

## 2022-08-31 ENCOUNTER — Ambulatory Visit (INDEPENDENT_AMBULATORY_CARE_PROVIDER_SITE_OTHER): Payer: Self-pay | Admitting: Obstetrics and Gynecology

## 2022-08-31 ENCOUNTER — Other Ambulatory Visit: Payer: Self-pay

## 2022-08-31 VITALS — BP 124/82 | HR 90 | Temp 98.4°F | Ht 69.0 in | Wt >= 6400 oz

## 2022-08-31 VITALS — BP 133/88 | HR 89 | Ht 69.0 in | Wt >= 6400 oz

## 2022-08-31 DIAGNOSIS — F17210 Nicotine dependence, cigarettes, uncomplicated: Secondary | ICD-10-CM

## 2022-08-31 DIAGNOSIS — F17200 Nicotine dependence, unspecified, uncomplicated: Secondary | ICD-10-CM

## 2022-08-31 DIAGNOSIS — I10 Essential (primary) hypertension: Secondary | ICD-10-CM

## 2022-08-31 DIAGNOSIS — Z0000 Encounter for general adult medical examination without abnormal findings: Secondary | ICD-10-CM

## 2022-08-31 DIAGNOSIS — R0683 Snoring: Secondary | ICD-10-CM

## 2022-08-31 DIAGNOSIS — Z86018 Personal history of other benign neoplasm: Secondary | ICD-10-CM

## 2022-08-31 DIAGNOSIS — G5603 Carpal tunnel syndrome, bilateral upper limbs: Secondary | ICD-10-CM

## 2022-08-31 DIAGNOSIS — Z23 Encounter for immunization: Secondary | ICD-10-CM

## 2022-08-31 DIAGNOSIS — Z6841 Body Mass Index (BMI) 40.0 and over, adult: Secondary | ICD-10-CM

## 2022-08-31 DIAGNOSIS — Z01419 Encounter for gynecological examination (general) (routine) without abnormal findings: Secondary | ICD-10-CM | POA: Insufficient documentation

## 2022-08-31 DIAGNOSIS — R609 Edema, unspecified: Secondary | ICD-10-CM

## 2022-08-31 DIAGNOSIS — Z0001 Encounter for general adult medical examination with abnormal findings: Secondary | ICD-10-CM

## 2022-08-31 MED ORDER — FUROSEMIDE 20 MG PO TABS
20.0000 mg | ORAL_TABLET | Freq: Every day | ORAL | 1 refills | Status: DC
  Filled 2022-08-31: qty 30, 30d supply, fill #0

## 2022-08-31 MED ORDER — ACETAMINOPHEN 500 MG PO TABS
1000.0000 mg | ORAL_TABLET | Freq: Four times a day (QID) | ORAL | 1 refills | Status: DC | PRN
  Filled 2022-08-31: qty 100, 13d supply, fill #0

## 2022-08-31 MED ORDER — NICOTINE 21 MG/24HR TD PT24
21.0000 mg | MEDICATED_PATCH | Freq: Every day | TRANSDERMAL | 1 refills | Status: DC
  Filled 2022-08-31 – 2022-10-30 (×2): qty 28, 28d supply, fill #0

## 2022-08-31 MED ORDER — TETANUS-DIPHTH-ACELL PERTUSSIS 5-2-15.5 LF-MCG/0.5 IM SUSP: 0.5000 mL | Freq: Once | INTRAMUSCULAR | 0 refills | Status: AC

## 2022-08-31 NOTE — Progress Notes (Signed)
GYNECOLOGY ANNUAL PREVENTATIVE CARE ENCOUNTER NOTE  History:     Judy Hanna is a 37 y.o. G26P0030 female here for a routine annual gynecologic exam.  Current complaints: none.   Denies abnormal vaginal bleeding, discharge, pelvic pain, problems with intercourse or other gynecologic concerns.    Gynecologic History Patient's last menstrual period was 08/02/2022 (approximate). Contraception: none Last Pap: 09/04/19. Results were: normal with negative HPV Last mammogram: n/a  Obstetric History OB History  Gravida Para Term Preterm AB Living  3 0 0 0 3    SAB IAB Ectopic Multiple Live Births  0 3 0        # Outcome Date GA Lbr Len/2nd Weight Sex Delivery Anes PTL Lv  3 IAB           2 IAB           1 IAB             Past Medical History:  Diagnosis Date   Asthma    BMI 50.0-59.9, adult (HCC) 06/26/2020   Bronchitis    Depression    Fibroids    PTSD (post-traumatic stress disorder)     Past Surgical History:  Procedure Laterality Date   MYOMECTOMY N/A 06/18/2021   Procedure: ABDOMINAL MYOMECTOMY;  Surgeon: Warden Fillers, MD;  Location: [facility] OR;  Service: Gynecology;  Laterality: N/A;   NO PAST SURGERIES      Current Outpatient Medications on File Prior to Visit  Medication Sig Dispense Refill   acetaminophen (TYLENOL) 500 MG tablet Take 2 tablets (1,000 mg total) by mouth every 6 (six) hours as needed for fever or headache. 100 tablet 1   albuterol (VENTOLIN HFA) 108 (90 Base) MCG/ACT inhaler Inhale 1-2 puffs into the lungs every 6 (six) hours as needed for wheezing or shortness of breath. 6.7 g 1   ascorbic acid (VITAMIN C) 500 MG tablet Take 1 tablet (500 mg total) by mouth daily. 90 tablet 2   cyclobenzaprine (FLEXERIL) 10 MG tablet Take 1 tablet (10 mg total) by mouth 2 (two) times daily as needed for muscle spasms. 20 tablet 0   escitalopram (LEXAPRO) 10 MG tablet Take 1 tablet (10 mg total) by mouth daily. 30 tablet 2   furosemide (LASIX) 20 MG tablet Take 1  tablet (20 mg total) by mouth daily for 5 days then as needed for swelling. 30 tablet 1   losartan (COZAAR) 25 MG tablet Take 1 tablet (25 mg total) by mouth daily. 90 tablet 0   metroNIDAZOLE (METROGEL) 0.75 % vaginal gel Place 1 Applicatorful vaginally at bedtime. Apply one applicatorful to vagina at bedtime for 5 days 70 g 1   Multiple Vitamins-Minerals (MULTIVITAMIN WITH MINERALS) tablet Take 1 tablet by mouth 3 (three) times a week.     mupirocin ointment (BACTROBAN) 2 % Apply 1 Application topically 2 (two) times daily for 1 week as needed. 22 g 1   nicotine (NICODERM CQ - DOSED IN MG/24 HOURS) 21 mg/24hr patch Place 1 patch (21 mg total) onto the skin daily. 28 patch 1   omeprazole (PRILOSEC) 20 MG capsule Take 1 capsule (20 mg total) by mouth 2 (two) times daily before a meal. 60 capsule 0   simethicone (MYLICON) 80 MG chewable tablet Chew 1 tablet (80 mg total) by mouth 4 (four) times daily as needed for flatulence. 30 tablet 0   sulfamethoxazole-trimethoprim (BACTRIM DS) 800-160 MG tablet Take 1 tablet by mouth 2 (two) times daily. 14 tablet 1  SUMAtriptan (IMITREX) 50 MG tablet Take 1 tablet at the start of the headache.  May repeat in 2 hours if headache does not resolve.  Max 2 hours / 24-hour. 10 tablet 1   Tdap (ADACEL) 08-26-13.5 LF-MCG/0.5 injection Inject 0.5 mLs into the muscle once for 1 dose. 0.5 mL 0   Triclosan 0.3 % LIQD Apply liquid topically daily. Use in the shower, follow with clindamycin gel after showering before applying lotion 355 mL 3   zinc gluconate 50 MG tablet Take 50 mg by mouth daily.     No current facility-administered medications on file prior to visit.    No Known Allergies  Social History:  reports that she has been smoking cigarettes. She has a 3.00 pack-year smoking history. She has never used smokeless tobacco. She reports current alcohol use. She reports that she does not currently use drugs after having used the following drugs: Marijuana.  Family  History  Problem Relation Age of Onset   Hypertension Mother    Hypertension Sister    Hypertension Maternal Grandmother     The following portions of the patient's history were reviewed and updated as appropriate: allergies, current medications, past family history, past medical history, past social history, past surgical history and problem list.  Review of Systems Pertinent items noted in HPI and remainder of comprehensive ROS otherwise negative.  Physical Exam:  BP 133/88   Pulse 89   Ht 5\' 9"  (1.753 m)   Wt (!) 404 lb 3.2 oz (183.3 kg)   LMP 08/02/2022 (Approximate)   BMI 59.69 kg/m  CONSTITUTIONAL: Well-developed, obese, well-nourished female in no acute distress.  HENT:  Normocephalic, atraumatic, External right and left ear normal. Oropharynx is clear and moist EYES: Conjunctivae and EOM are normal.  NECK: Normal range of motion, supple, no masses.  Normal thyroid.  SKIN: Skin is warm and dry. No rash noted. Not diaphoretic. No erythema. No pallor. MUSCULOSKELETAL: Normal range of motion. No tenderness.  No cyanosis, clubbing, or edema.  2+ distal pulses. NEUROLOGIC: Alert and oriented to person, place, and time. Normal reflexes, muscle tone coordination.  PSYCHIATRIC: Normal mood and affect. Normal behavior. Normal judgment and thought content. CARDIOVASCULAR: Normal heart rate noted, regular rhythm RESPIRATORY: Clear to auscultation bilaterally. Effort and breath sounds normal, no problems with respiration noted. BREASTS: deferred ABDOMEN: Soft, no distention noted.  No tenderness, rebound or guarding.  PELVIC: Normal appearing external genitalia and urethral meatus; normal appearing vaginal mucosa and cervix.  No abnormal discharge noted.  Pap smear obtained.  Normal uterine size, no other palpable masses, no uterine or adnexal tenderness.  Performed in the presence of a chaperone.   Assessment and Plan:    1. Women's annual routine gynecological examination Normal  annual exam Pt expressed interest in getting pregnant in the near future.  She is a year out from her abdominal myomectomy.  She can proceed with attempting pregnancy.  Advised PNV. - Cytology - PAP  2. BMI 50.0-59.9, adult (HCC) Discussed weight loss prior to achieving pregnancy. Discussed diet modification as well as possible use of GLP-1; however, PCP is holding off on use of injections.  3. History of uterine fibroid Pt is s/p abdominal myomectomy. Will hold off on follow up ultrasound at this time  Will follow up results of pap smear and manage accordingly. Routine preventative health maintenance measures emphasized. Please refer to After Visit Summary for other counseling recommendations.      Mariel Aloe, MD, FACOG Obstetrician & Gynecologist, Faculty Practice  [facility] [place]

## 2022-08-31 NOTE — Progress Notes (Signed)
Patient ID: Judy Hanna, female    DOB: March 18, 1986  MRN: [id number]  CC: Annual Exam (Physical. Med refills - pt prefers naproxen over flexeril, refill for albuterol/Discuss weight/Requesting nicotine patch/Yes to Tdap vax.)   Subjective: Judy Hanna is a 37 y.o. female who presents for annual exam Her concerns today include:  Patient with history of tobacco dependence, obesity, fibroids, menstrual migraines, recurrent boils genital area.  Has appt this afternoon Dr. Donavan Foil for her PAP. Being followed for fibroids s/p myomectomy  Obesity:  concern about her weight.  Asking about Ozempic.  Currently uninsured.  Wgh continues to increase despite not eating as much  and avoids fast foods.  Drinks juice (2-3 cups a day), sodas (two 20 oz bottles a day), sweet tea at dinner time and water.  Meats: chicken, shirmp, stake and fried fish.  No much bread or rice.  Loves fruits and veggies. Tries to take walks when at work but not as active as she should be.  Concern about other complications that can occur from the wgh.  Friend tells her she stops breathing in sleep and snores loud.  Wakes feeling un-refresh.  Finds herself dozing off during the day.    Tob dep:  1/2-1 pk a day.  Smoked for over 5-6 yrs.  when I try to stop it triggers the mood - gets irritable.  Would like to try to patches.  Never tried it before.    HTN:  Takes Cozaar 25 mg daily. Takes [place]ie PRN for LE edema  Depression:  followed by [facility].  On Lexapro  Continues to have intermittent numbness in the second third and fourth fingers on both hands;  right greater than left.  Mainly at nights.  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  We did not have cock up wrist splints to give her.  Advised that she can purchase over-the-counter.  She did not do so.  HM:  due for Tdapt vaccine, will have PAP today by her GYN Patient Active Problem List   Diagnosis Date Noted   Anxiety 07/27/2022   Major depressive disorder,  recurrent severe without psychotic features (HCC) 05/26/2022   Visit for wound check 06/25/2021   Fibroid uterus 06/18/2021   S/P myomectomy 06/18/2021   PTSD (post-traumatic stress disorder) 03/10/2021   BMI 50.0-59.9, adult (HCC) 06/26/2020   Tobacco abuse 06/26/2020   Fibroids 05/22/2020   Axillary abscess 05/22/2020   Essential hypertension 05/22/2020   Morbid obesity (HCC) 06/05/2019   Tobacco dependence 06/05/2019   Menstrual migraine without status migrainosus, not intractable 06/05/2019   History of abnormal cervical Pap smear 06/05/2019     Current Outpatient Medications on File Prior to Visit  Medication Sig Dispense Refill   albuterol (VENTOLIN HFA) 108 (90 Base) MCG/ACT inhaler Inhale 1-2 puffs into the lungs every 6 (six) hours as needed for wheezing or shortness of breath. 6.7 g 1   ascorbic acid (VITAMIN C) 500 MG tablet Take 1 tablet (500 mg total) by mouth daily. 90 tablet 2   cyclobenzaprine (FLEXERIL) 10 MG tablet Take 1 tablet (10 mg total) by mouth 2 (two) times daily as needed for muscle spasms. 20 tablet 0   escitalopram (LEXAPRO) 10 MG tablet Take 1 tablet (10 mg total) by mouth daily. 30 tablet 2   losartan (COZAAR) 25 MG tablet Take 1 tablet (25 mg total) by mouth daily. 90 tablet 0   metroNIDAZOLE (METROGEL) 0.75 % vaginal gel Place 1 Applicatorful vaginally at bedtime. Apply  one applicatorful to vagina at bedtime for 5 days 70 g 1   Multiple Vitamins-Minerals (MULTIVITAMIN WITH MINERALS) tablet Take 1 tablet by mouth 3 (three) times a week.     mupirocin ointment (BACTROBAN) 2 % Apply 1 Application topically 2 (two) times daily for 1 week as needed. 22 g 1   omeprazole (PRILOSEC) 20 MG capsule Take 1 capsule (20 mg total) by mouth 2 (two) times daily before a meal. 60 capsule 0   simethicone (MYLICON) 80 MG chewable tablet Chew 1 tablet (80 mg total) by mouth 4 (four) times daily as needed for flatulence. 30 tablet 0   sulfamethoxazole-trimethoprim (BACTRIM DS)  800-160 MG tablet Take 1 tablet by mouth 2 (two) times daily. 14 tablet 1   SUMAtriptan (IMITREX) 50 MG tablet Take 1 tablet at the start of the headache.  May repeat in 2 hours if headache does not resolve.  Max 2 hours / 24-hour. 10 tablet 1   Triclosan 0.3 % LIQD Apply liquid topically daily. Use in the shower, follow with clindamycin gel after showering before applying lotion 355 mL 3   zinc gluconate 50 MG tablet Take 50 mg by mouth daily.     No current facility-administered medications on file prior to visit.    No Known Allergies  Social History   Socioeconomic History   Marital status: Single    Spouse name: Not on file   Number of children: 0   Years of education: Not on file   Highest education level: Some college, no degree  Occupational History   Occupation: home health care  Tobacco Use   Smoking status: Every Day    Packs/day: 1.00    Years: 3.00    Additional pack years: 0.00    Total pack years: 3.00    Types: Cigarettes   Smokeless tobacco: Never  Vaping Use   Vaping Use: Never used  Substance and Sexual Activity   Alcohol use: Yes    Comment: 1-2 drinks 2x weekly   Drug use: Not Currently    Types: Marijuana   Sexual activity: Yes    Partners: Female  Other Topics Concern   Not on file  Social History Narrative   ** Merged History Encounter **       Social Determinants of Health   Financial Resource Strain: Medium Risk (08/07/2022)   Overall Financial Resource Strain (CARDIA)    Difficulty of Paying Living Expenses: Somewhat hard  Food Insecurity: No Food Insecurity (08/07/2022)   Hunger Vital Sign    Worried About Running Out of Food in the Last Year: Never true    Ran Out of Food in the Last Year: Never true  Transportation Needs: No Transportation Needs (08/07/2022)   PRAPARE - Administrator, Civil Service (Medical): No    Lack of Transportation (Non-Medical): No  Physical Activity: Inactive (08/07/2022)   Exercise Vital Sign     Days of Exercise per Week: 0 days    Minutes of Exercise per Session: 0 min  Stress: Stress Concern Present (08/07/2022)   [employer] of Occupational Health - Occupational Stress Questionnaire    Feeling of Stress : Rather much  Social Connections: Socially Isolated (08/07/2022)   Social Connection and Isolation Panel [NHANES]    Frequency of Communication with Friends and Family: Three times a week    Frequency of Social Gatherings with Friends and Family: Three times a week    Attends Religious Services: Never    Active Member  of Clubs or Organizations: No    Attends Banker Meetings: Never    Marital Status: Never married  Intimate Partner Violence: Not At Risk (08/07/2022)   Humiliation, Afraid, Rape, and Kick questionnaire    Fear of Current or Ex-Partner: No    Emotionally Abused: No    Physically Abused: No    Sexually Abused: No    Family History  Problem Relation Age of Onset   Hypertension Mother    Hypertension Sister    Hypertension Maternal Grandmother     Past Surgical History:  Procedure Laterality Date   MYOMECTOMY N/A 06/18/2021   Procedure: ABDOMINAL MYOMECTOMY;  Surgeon: Warden Fillers, MD;  Location: [facility] OR;  Service: Gynecology;  Laterality: N/A;   NO PAST SURGERIES      ROS: Review of Systems  Eyes:  Negative for visual disturbance.  Respiratory:  Negative for shortness of breath.   Cardiovascular:  Negative for chest pain.  Gastrointestinal:        Bowel movements are regular.   Negative except as stated above  PHYSICAL EXAM: BP 124/82 (BP Location: Left Arm, Patient Position: Sitting, Cuff Size: Normal)   Pulse 90   Temp 98.4 F (36.9 C) (Oral)   Ht 5\' 9"  (1.753 m)   Wt (!) 403 lb (182.8 kg)   LMP 08/02/2022 (Approximate)   SpO2 99%   BMI 59.51 kg/m   Wt Readings from Last 3 Encounters:  08/31/22 (!) 403 lb (182.8 kg)  08/11/22 (!) 394 lb 11.2 oz (179 kg)  07/27/22 (!) 398 lb 1.6 oz (180.6 kg)    Physical  Exam  General appearance - alert, well appearing, young to middle-age morbidly obese African-American female and in no distress Mental status - normal mood, behavior, speech, dress, motor activity, and thought processes Eyes - pupils equal and reactive, extraocular eye movements intact Ears - bilateral TM's and external ear canals normal Nose -mild enlargement of both nasal turbinates. Mouth - mucous membranes moist, pharynx normal without lesions Neck - supple, no significant adenopathy Lymphatics - no palpable lymphadenopathy, no hepatosplenomegaly Chest - clear to auscultation, no wheezes, rales or rhonchi, symmetric air entry Heart - normal rate, regular rhythm, normal S1, S2, no murmurs, rubs, clicks or gallops Abdomen -obese, soft, nontender, nondistended, no masses or organomegaly Neurological -grip 5/5 bilaterally.  Tinel's sign negative bilaterally.  No wasting of hand muscles Extremities -trace edema of the lower legs just above the ankles.    Latest Ref Rng & Units 06/22/2022   10:15 AM 05/18/2022   11:40 AM 01/31/2022   12:39 PM  CMP  Glucose 70 - 99 mg/dL 161  88  97   BUN 6 - 20 mg/dL 9  9  6    Creatinine 0.44 - 1.00 mg/dL 0.96  0.45  4.09   Sodium 135 - 145 mmol/L 138  139  138   Potassium 3.5 - 5.1 mmol/L 4.0  4.0  3.8   Chloride 98 - 111 mmol/L 106  104  105   CO2 22 - 32 mmol/L 27  28  28    Calcium 8.9 - 10.3 mg/dL 8.2  8.9  8.6   Total Protein 6.5 - 8.1 g/dL 6.7     Total Bilirubin 0.3 - 1.2 mg/dL 0.5     Alkaline Phos 38 - 126 U/L 51     AST 15 - 41 U/L 16     ALT 0 - 44 U/L 16      Lipid Panel  Component Value Date/Time   CHOL 185 06/05/2019 1131   TRIG 63 06/05/2019 1131   HDL 58 06/05/2019 1131   CHOLHDL 3.2 06/05/2019 1131   LDLCALC 115 (H) 06/05/2019 1131    CBC    Component Value Date/Time   WBC 6.1 06/22/2022 1015   RBC 5.41 (H) 06/22/2022 1015   HGB 14.1 06/22/2022 1015   HGB 13.5 06/05/2019 1131   HCT 41.8 06/22/2022 1015   HCT 39.5  06/05/2019 1131   PLT 317 06/22/2022 1015   PLT 374 06/05/2019 1131   MCV 77.3 (L) 06/22/2022 1015   MCV 79 06/05/2019 1131   MCH 26.1 06/22/2022 1015   MCHC 33.7 06/22/2022 1015   RDW 15.0 06/22/2022 1015   RDW 15.0 06/05/2019 1131   LYMPHSABS 1.8 06/22/2022 1015   MONOABS 1.0 06/22/2022 1015   EOSABS 0.3 06/22/2022 1015   BASOSABS 0.0 06/22/2022 1015    ASSESSMENT AND PLAN: 1. Annual physical exam   2. Morbid obesity (HCC) Discussed the importance of healthy eating habits and regular exercise to achieve weight loss.  Encouraged her to try to exercise at least 3 to 5 days a week for 30 minutes moderate intensity. Discussed 1 possible changes she can make in her diet right now that would bring about big results and that he is cutting out the sugary drinks. Patient does not have diabetes and is uninsured and therefore Ozempic is not an option at this time.  3. Essential hypertension Close to goal.  Continue Cozaar.  4. Edema, unspecified type - furosemide (LASIX) 20 MG tablet; Take 1 tablet (20 mg total) by mouth daily for 5 days then as needed for swelling.  Dispense: 30 tablet; Refill: 1  5. Tobacco dependence Pt is current smoker. Patient advised to quit smoking. Discussed health risks associated with smoking including lung and other types of cancers, chronic lung diseases and CV risks.. Pt ready to give trail of quitting.   Discussed methods to help quit including quitting cold [place], use of NRT, Chantix and Bupropion.  Pt wanting to try: Nicotine patches.  We discussed the stepdown approach _3_ Minutes spent on counseling. F/U: Assess progress on subsequent visit  - nicotine (NICODERM CQ - DOSED IN MG/24 HOURS) 21 mg/24hr patch; Place 1 patch (21 mg total) onto the skin daily.  Dispense: 28 patch; Refill: 1  6. Loud snoring History of witnessed apnea, loud snoring and nonrestorative sleep very suggestive of obstructive sleep apnea.  She is agreeable to sleep study. -  PSG Sleep Study; Future  7. Carpal tunnel syndrome on both sides Discussed diagnosis of carpal tunnel syndrome.  Recommend trial of conservative measures with using cock up wrist splints.  Advised that she can buy a pair at any medical supply store.  8. Need for Tdap vaccination Scription printed and given for her to take to our pharmacy downstairs to get tetanus vaccine. - Tdap (ADACEL) 08-26-13.5 LF-MCG/0.5 injection; Inject 0.5 mLs into the muscle once for 1 dose.  Dispense: 0.5 mL; Refill: 0   Patient was given the opportunity to ask questions.  Patient verbalized understanding of the plan and was able to repeat key elements of the plan.   This documentation was completed using Paediatric nurse.  Any transcriptional errors are unintentional.  Orders Placed This Encounter  Procedures   PSG Sleep Study     Requested Prescriptions   Signed Prescriptions Disp Refills   acetaminophen (TYLENOL) 500 MG tablet 100 tablet 1    Sig:  Take 2 tablets (1,000 mg total) by mouth every 6 (six) hours as needed for fever or headache.   furosemide (LASIX) 20 MG tablet 30 tablet 1    Sig: Take 1 tablet (20 mg total) by mouth daily for 5 days then as needed for swelling.   nicotine (NICODERM CQ - DOSED IN MG/24 HOURS) 21 mg/24hr patch 28 patch 1    Sig: Place 1 patch (21 mg total) onto the skin daily.   Tdap (ADACEL) 08-26-13.5 LF-MCG/0.5 injection 0.5 mL 0    Sig: Inject 0.5 mLs into the muscle once for 1 dose.    Return in about 4 months (around 01/01/2023).  Jonah Blue, MD, FACP

## 2022-08-31 NOTE — Patient Instructions (Signed)
Try to eliminate sugary drinks from the diet as discussed today. Try to walk at least 3 to 5 days a week for 30 minutes. Purchase a pair of cock up wrist splints from any medical supply store and wear them at nights on both hands.  Preventive Care 17-36 Years Old, Female Preventive care refers to lifestyle choices and visits with your health care provider that can promote health and wellness. Preventive care visits are also called wellness exams. What can I expect for my preventive care visit? Counseling During your preventive care visit, your health care provider may ask about your: Medical history, including: Past medical problems. Family medical history. Pregnancy history. Current health, including: Menstrual cycle. Method of birth control. Emotional well-being. Home life and relationship well-being. Sexual activity and sexual health. Lifestyle, including: Alcohol, nicotine or tobacco, and drug use. Access to firearms. Diet, exercise, and sleep habits. Work and work Astronomer. Sunscreen use. Safety issues such as seatbelt and bike helmet use. Physical exam Your health care provider may check your: Height and weight. These may be used to calculate your BMI (body mass index). BMI is a measurement that tells if you are at a healthy weight. Waist circumference. This measures the distance around your waistline. This measurement also tells if you are at a healthy weight and may help predict your risk of certain diseases, such as type 2 diabetes and high blood pressure. Heart rate and blood pressure. Body temperature. Skin for abnormal spots. What immunizations do I need?  Vaccines are usually given at various ages, according to a schedule. Your health care provider will recommend vaccines for you based on your age, medical history, and lifestyle or other factors, such as travel or where you work. What tests do I need? Screening Your health care provider may recommend screening  tests for certain conditions. This may include: Pelvic exam and Pap test. Lipid and cholesterol levels. Diabetes screening. This is done by checking your blood sugar (glucose) after you have not eaten for a while (fasting). Hepatitis B test. Hepatitis C test. HIV (human immunodeficiency virus) test. STI (sexually transmitted infection) testing, if you are at risk. BRCA-related cancer screening. This may be done if you have a family history of breast, ovarian, tubal, or peritoneal cancers. Talk with your health care provider about your test results, treatment options, and if necessary, the need for more tests. Follow these instructions at home: Eating and drinking  Eat a healthy diet that includes fresh fruits and vegetables, whole grains, lean protein, and low-fat dairy products. Take vitamin and mineral supplements as recommended by your health care provider. Do not drink alcohol if: Your health care provider tells you not to drink. You are pregnant, may be pregnant, or are planning to become pregnant. If you drink alcohol: Limit how much you have to 0-1 drink a day. Know how much alcohol is in your drink. In the [place] one drink equals one 12 oz bottle of beer (355 mL), one 5 oz glass of wine (148 mL), or one 1 oz glass of hard liquor (44 mL). Lifestyle Brush your teeth every morning and night with fluoride toothpaste. Floss one time each day. Exercise for at least 30 minutes 5 or more days each week. Do not use any products that contain nicotine or tobacco. These products include cigarettes, chewing tobacco, and vaping devices, such as e-cigarettes. If you need help quitting, ask your health care provider. Do not use drugs. If you are sexually active, practice safe sex. Use a  condom or other form of protection to prevent STIs. If you do not wish to become pregnant, use a form of birth control. If you plan to become pregnant, see your health care provider for a prepregnancy visit. Find  healthy ways to manage stress, such as: Meditation, yoga, or listening to music. Journaling. Talking to a trusted person. Spending time with friends and family. Minimize exposure to UV radiation to reduce your risk of skin cancer. Safety Always wear your seat belt while driving or riding in a vehicle. Do not drive: If you have been drinking alcohol. Do not ride with someone who has been drinking. If you have been using any mind-altering substances or drugs. While texting. When you are tired or distracted. Wear a helmet and other protective equipment during sports activities. If you have firearms in your house, make sure you follow all gun safety procedures. Seek help if you have been physically or sexually abused. What's next? Go to your health care provider once a year for an annual wellness visit. Ask your health care provider how often you should have your eyes and teeth checked. Stay up to date on all vaccines. This information is not intended to replace advice given to you by your health care provider. Make sure you discuss any questions you have with your health care provider. Document Revised: 10/09/2020 Document Reviewed: 10/09/2020 Elsevier Patient Education  2023 [employer].

## 2022-09-02 LAB — CYTOLOGY - PAP
Chlamydia: NEGATIVE
Comment: NEGATIVE
Comment: NORMAL
Diagnosis: NEGATIVE
Neisseria Gonorrhea: NEGATIVE
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
## 2022-09-07 ENCOUNTER — Encounter (HOSPITAL_COMMUNITY): Payer: Self-pay | Admitting: Psychiatry

## 2022-09-07 ENCOUNTER — Other Ambulatory Visit: Payer: Self-pay

## 2022-09-07 ENCOUNTER — Telehealth (INDEPENDENT_AMBULATORY_CARE_PROVIDER_SITE_OTHER): Payer: No Payment, Other | Admitting: Psychiatry

## 2022-09-07 DIAGNOSIS — F4310 Post-traumatic stress disorder, unspecified: Secondary | ICD-10-CM | POA: Diagnosis not present

## 2022-09-07 DIAGNOSIS — F419 Anxiety disorder, unspecified: Secondary | ICD-10-CM | POA: Diagnosis not present

## 2022-09-07 DIAGNOSIS — F331 Major depressive disorder, recurrent, moderate: Secondary | ICD-10-CM | POA: Diagnosis not present

## 2022-09-07 MED ORDER — ESCITALOPRAM OXALATE 10 MG PO TABS
10.0000 mg | ORAL_TABLET | Freq: Every day | ORAL | 2 refills | Status: DC
  Filled 2022-09-07: qty 30, 30d supply, fill #0

## 2022-09-07 MED ORDER — PRAZOSIN HCL 1 MG PO CAPS
1.0000 mg | ORAL_CAPSULE | Freq: Every day | ORAL | 2 refills | Status: DC
  Filled 2022-09-07: qty 30, 30d supply, fill #0

## 2022-09-07 NOTE — Patient Instructions (Signed)
Thank you for attending your appointment today.  -- START Prazosin 1 mg nightly -- Continue other medications as prescribed.  Please do not make any changes to medications without first discussing with your provider. If you are experiencing a psychiatric emergency, please call 911 or present to your nearest emergency department. Additional crisis, medication management, and therapy resources are included below.  [facility]  [address] [place], [place] [address] [phone] WALK-IN URGENT CARE 24/7 FOR ANYONE [address], [place], [place]  [phone] Fax: [phone] [email or web address] *Interpreters available *Accepts all insurance and uninsured for Urgent Care needs *Accepts Medicaid and uninsured for outpatient treatment (below)      ONLY FOR [facility]  Below:    Outpatient New Patient Assessment/Therapy Walk-ins:        Monday -Thursday 8am until slots are full.        Every Friday 1pm-4pm  (first come, first served)                   New Patient Psychiatry/Medication Management        Monday-Friday 8am-11am (first come, first served)               For all walk-ins we ask that you arrive by 7:15am, because patients will be seen in the order of arrival.

## 2022-09-14 ENCOUNTER — Other Ambulatory Visit: Payer: Self-pay

## 2022-10-18 ENCOUNTER — Ambulatory Visit (HOSPITAL_BASED_OUTPATIENT_CLINIC_OR_DEPARTMENT_OTHER): Payer: Medicaid Other | Attending: Internal Medicine | Admitting: Internal Medicine

## 2022-10-18 DIAGNOSIS — R0683 Snoring: Secondary | ICD-10-CM | POA: Insufficient documentation

## 2022-10-18 DIAGNOSIS — G4733 Obstructive sleep apnea (adult) (pediatric): Secondary | ICD-10-CM | POA: Diagnosis not present

## 2022-10-21 DIAGNOSIS — R0683 Snoring: Secondary | ICD-10-CM

## 2022-10-21 NOTE — Procedures (Signed)
   Patient Name: Judy, Hanna Date: 10/18/2022 Gender: Female D.O.B: 1985/09/25 Age (years): 36 Referring Provider: Jonah Blue MD Height (inches): 69 Interpreting Physician: Jetty Duhamel MD, ABSM Weight (lbs): 380 RPSGT: Cherylann Parr BMI: 56 MRN: [id number] Neck Size: 18.00  CLINICAL INFORMATION Sleep Study Type: NPSG Indication for sleep study: Obesity, Snoring, Witnesses Apnea / Gasping During Sleep Epworth Sleepiness Score: 10  SLEEP STUDY TECHNIQUE As per the AASM Manual for the Scoring of Sleep and Associated Events v2.3 (April 2016) with a hypopnea requiring 4% desaturations.  The channels recorded and monitored were frontal, central and occipital EEG, electrooculogram (EOG), submentalis EMG (chin), nasal and oral airflow, thoracic and abdominal wall motion, anterior tibialis EMG, snore microphone, electrocardiogram, and pulse oximetry.  MEDICATIONS Medications self-administered by patient taken the night of the study : none reported  SLEEP ARCHITECTURE The study was initiated at 10:08:39 PM and ended at 4:58:45 AM.  Sleep onset time was 14.3 minutes and the sleep efficiency was 87.6%. The total sleep time was 359.3 minutes.  Stage REM latency was 104.0 minutes.  The patient spent 1.5% of the night in stage N1 sleep, 77.5% in stage N2 sleep, 0.0% in stage N3 and 21% in REM.  Alpha intrusion was absent.  Supine sleep was 61.18%.  RESPIRATORY PARAMETERS The overall apnea/hypopnea index (AHI) was 20.9 per hour. There were 31 total apneas, including 31 obstructive, 0 central and 0 mixed apneas. There were 94 hypopneas and 0 RERAs.  The AHI during Stage REM sleep was 50.9 per hour.  AHI while supine was 22.4 per hour.  The mean oxygen saturation was 94.5%. The minimum SpO2 during sleep was 81.0%.  loud snoring was noted during this study.  CARDIAC DATA The 2 lead EKG demonstrated sinus rhythm. The mean heart rate was 83.4 beats per minute. Other EKG  findings include: None.  LEG MOVEMENT DATA The total PLMS were 0 with a resulting PLMS index of 0.0. Associated arousal with leg movement index was 0.0 .  IMPRESSIONS - Moderate obstructive sleep apnea occurred during this study (AHI = 20.9/h). - Mild oxygen desaturation was noted during this study (Min O2 = 81.0%, Mean 94.5%). - The patient snored with loud snoring volume. - No cardiac abnormalities were noted during this study. - Clinically significant periodic limb movements did not occur during sleep. No significant associated arousals  DIAGNOSIS - Obstructive Sleep Apnea (G47.33)  RECOMMENDATIONS - Suggest CPAP titration sleep study or autopap. Other options would be based on clinical judgment. - Be careful with alcohol, sedatives and other CNS depressants that may worsen sleep apnea and disrupt normal sleep architecture. - Sleep hygiene should be reviewed to assess factors that may improve sleep quality. - Weight management and regular exercise should be initiated or continued if appropriate.  [Electronically signed] 10/21/2022 12:58 PM  Jetty Duhamel MD, ABSM Diplomate, American Board of Sleep Medicine NPI: [id number]                          Jetty Duhamel Diplomate, American Board of Sleep Medicine  ELECTRONICALLY SIGNED ON:  10/21/2022, 12:54 PM [facility] PH: [phone]   FX: [phone] ACCREDITED BY THE AMERICAN ACADEMY OF SLEEP MEDICINE

## 2022-10-22 ENCOUNTER — Ambulatory Visit (INDEPENDENT_AMBULATORY_CARE_PROVIDER_SITE_OTHER): Payer: No Payment, Other | Admitting: Clinical

## 2022-10-22 DIAGNOSIS — F331 Major depressive disorder, recurrent, moderate: Secondary | ICD-10-CM | POA: Diagnosis not present

## 2022-10-22 NOTE — Progress Notes (Signed)
   THERAPIST PROGRESS NOTE\ Virtual Visit via Video Note  I connected with [name] on 10/22/2022 at  1:00 PM EDT by a video enabled telemedicine application and verified that I am speaking with the correct person using two identifiers.  Location: Patient: home Provider: office   I discussed the limitations of evaluation and management by telemedicine and the availability of in person appointments. The patient expressed understanding and agreed to proceed.   Follow Up Instructions:  I discussed the assessment and treatment plan with the patient. The patient was provided an opportunity to ask questions and all were answered. The patient agreed with the plan and demonstrated an understanding of the instructions.   The patient was advised to call back or seek an in-person evaluation if the symptoms worsen or if the condition fails to improve as anticipated.   Session Time: 25 minutes  Participation Level: Active  Behavioral Response: CasualAlertEuthymic  Type of Therapy: Individual Therapy  Treatment Goals addressed: client will identify 3 cognitive patterns and beliefs that support depression  ProgressTowards Goals: Progressing  Interventions: CBT and Supportive  Summary:  Advitha Finton is a 37 y.o. female who presents for scheduled appointment oriented x 5, appropriately dressed, and friendly.  Client denied hallucinations and delusions. Client reported on today she is doing pretty well. Client reported that she has a new psychiatrist with [facility] and have medication adjustment. Client reported she feels like the medication changes have been effective with managing her mood swings. Client reported she has some breakthrough times of irritability and visible anger.  Client reported overall she has been maintaining herself well and has been continuing to work. Client reported she has 1 day off of work per week and tries to make the most of her downtime.client reported she is going to  [place] soon to do a balloon release and cookout for [employer] of her brothers passing. Client reported the police still don't have answers about his death. Evidence of progress towards goal:  client reported medication compliance 5 out of 7 days per week.   Suicidal/Homicidal: Nowithout intent/plan  Therapist Response:  Therapist began the appointment asking the client how she has been doing. Therapist used cbt to engage using active listening and positive emotional support. Therapist used cbt to engage and ask about medication compliance compared to severity of symptoms. Therapist used cbt to encourage compliance and boundaries. Therapist used CBT ask the client to identify her progress with frequency of use with coping skills with continued practice in her daily activity.    Therapist assigned the client homework to practice journaling.    Plan: Return again in 4 weeks.  Diagnosis: major depressive disorder, recurrent episode, moderate with anxious distress  Collaboration of Care: Patient refused AEB none requested by the client.  Patient/Guardian was advised Release of Information must be obtained prior to any record release in order to collaborate their care with an outside provider. Patient/Guardian was advised if they have not already done so to contact the registration department to sign all necessary forms in order for [place] to release information regarding their care.   Consent: Patient/Guardian gives verbal consent for treatment and assignment of benefits for services provided during this visit. Patient/Guardian expressed understanding and agreed to proceed.   Neena Rhymes Nathanyl Andujo, LCSW 10/22/2022

## 2022-10-25 ENCOUNTER — Encounter: Payer: Self-pay | Admitting: Internal Medicine

## 2022-10-25 ENCOUNTER — Telehealth: Payer: Self-pay | Admitting: Internal Medicine

## 2022-10-26 ENCOUNTER — Encounter (HOSPITAL_COMMUNITY): Payer: Self-pay

## 2022-10-26 ENCOUNTER — Emergency Department (HOSPITAL_COMMUNITY): Payer: Medicaid Other

## 2022-10-26 ENCOUNTER — Emergency Department (HOSPITAL_COMMUNITY)
Admission: EM | Admit: 2022-10-26 | Discharge: 2022-10-26 | Disposition: A | Discharge status: Home / Self Care | Payer: Medicaid Other | Attending: Emergency Medicine | Admitting: Emergency Medicine

## 2022-10-26 ENCOUNTER — Other Ambulatory Visit: Payer: Self-pay

## 2022-10-26 DIAGNOSIS — Z79899 Other long term (current) drug therapy: Secondary | ICD-10-CM | POA: Diagnosis not present

## 2022-10-26 DIAGNOSIS — R079 Chest pain, unspecified: Secondary | ICD-10-CM | POA: Diagnosis present

## 2022-10-26 DIAGNOSIS — I10 Essential (primary) hypertension: Secondary | ICD-10-CM | POA: Diagnosis not present

## 2022-10-26 DIAGNOSIS — R600 Localized edema: Secondary | ICD-10-CM | POA: Diagnosis not present

## 2022-10-26 DIAGNOSIS — R202 Paresthesia of skin: Secondary | ICD-10-CM | POA: Diagnosis not present

## 2022-10-26 DIAGNOSIS — Z7951 Long term (current) use of inhaled steroids: Secondary | ICD-10-CM | POA: Diagnosis not present

## 2022-10-26 DIAGNOSIS — J45909 Unspecified asthma, uncomplicated: Secondary | ICD-10-CM | POA: Diagnosis not present

## 2022-10-26 DIAGNOSIS — M545 Low back pain, unspecified: Secondary | ICD-10-CM | POA: Insufficient documentation

## 2022-10-26 DIAGNOSIS — M542 Cervicalgia: Secondary | ICD-10-CM | POA: Insufficient documentation

## 2022-10-26 LAB — CBC WITH DIFFERENTIAL/PLATELET
Abs Immature Granulocytes: 0.04 10*3/uL (ref 0.00–0.07)
Basophils Absolute: 0 10*3/uL (ref 0.0–0.1)
Basophils Relative: 0 %
Eosinophils Absolute: 0.4 10*3/uL (ref 0.0–0.5)
Eosinophils Relative: 4 %
HCT: 37.5 % (ref 36.0–46.0)
Hemoglobin: 13 g/dL (ref 12.0–15.0)
Immature Granulocytes: 0 %
Lymphocytes Relative: 23 %
Lymphs Abs: 2.4 10*3/uL (ref 0.7–4.0)
MCH: 26.6 pg (ref 26.0–34.0)
MCHC: 34.7 g/dL (ref 30.0–36.0)
MCV: 76.7 fL — ABNORMAL LOW (ref 80.0–100.0)
Monocytes Absolute: 0.7 10*3/uL (ref 0.1–1.0)
Monocytes Relative: 7 %
Neutro Abs: 6.8 10*3/uL (ref 1.7–7.7)
Neutrophils Relative %: 66 %
Platelets: 350 10*3/uL (ref 150–400)
RBC: 4.89 MIL/uL (ref 3.87–5.11)
RDW: 14.7 % (ref 11.5–15.5)
WBC: 10.3 10*3/uL (ref 4.0–10.5)
nRBC: 0 % (ref 0.0–0.2)

## 2022-10-26 LAB — COMPREHENSIVE METABOLIC PANEL
ALT: 15 U/L (ref 0–44)
AST: 15 U/L (ref 15–41)
Albumin: 3.2 g/dL — ABNORMAL LOW (ref 3.5–5.0)
Alkaline Phosphatase: 54 U/L (ref 38–126)
Anion gap: 9 (ref 5–15)
BUN: 10 mg/dL (ref 6–20)
CO2: 24 mmol/L (ref 22–32)
Calcium: 8.4 mg/dL — ABNORMAL LOW (ref 8.9–10.3)
Chloride: 104 mmol/L (ref 98–111)
Creatinine, Ser: 0.86 mg/dL (ref 0.44–1.00)
GFR, Estimated: >60 mL/min (ref >60)
Glucose, Bld: 114 mg/dL — ABNORMAL HIGH (ref 70–99)
Potassium: 3.7 mmol/L (ref 3.5–5.1)
Sodium: 137 mmol/L (ref 135–145)
Total Bilirubin: 0.6 mg/dL (ref 0.3–1.2)
Total Protein: 6.2 g/dL — ABNORMAL LOW (ref 6.5–8.1)

## 2022-10-26 LAB — HCG, QUANTITATIVE, PREGNANCY: hCG, Beta Chain, Quant, S: <1 m[IU]/mL (ref <5)

## 2022-10-26 LAB — TROPONIN I (HIGH SENSITIVITY)
Troponin I (High Sensitivity): 4 ng/L (ref <18)
Troponin I (High Sensitivity): 5 ng/L (ref <18)

## 2022-10-26 MED ORDER — KETOROLAC TROMETHAMINE 30 MG/ML IJ SOLN
30.0000 mg | Freq: Once | INTRAMUSCULAR | Status: AC
  Administered 2022-10-26: 30 mg via INTRAVENOUS
  Filled 2022-10-26: qty 1

## 2022-10-26 NOTE — ED Triage Notes (Signed)
Pt came in via POV d/t Rt sided chest pain & Rt sided neck pain for the past 3 days, also endorses lower back pain but is unsure if they are all associated together. A/Ox4, rates her pain 10/10 while in triage.

## 2022-10-26 NOTE — Telephone Encounter (Signed)
Called & spoke to the patient. Verified name & DOB. Informed of results. Scheduled appointment for Friday 10/30/2022 at 8:10 a.m. Patient confirmed appointment and expressed verbal understanding.

## 2022-10-26 NOTE — ED Provider Notes (Signed)
[facility] AT [facility] Provider Note   CSN: [id number] Arrival date & time: 10/26/22  1249     History  Chief Complaint  Patient presents with   Chest Pain   Back Pain    Judy Hanna is a 37 y.o. female with past medical history significant for asthma, obesity, hypertension, tobacco dependence, PTSD, depression presents to the ED complaining of chest pain, right side neck pain and lower back pain.  Patient states her chest and neck pain started 3 days ago.  She reports the pain was initially in the center of her chest, but is now on the right side and extends down from her neck.  Patient also reports some tingling in her right arm, but no weakness.  She was recently diagnosed with sleep apnea, but has not been fitted for her CPAP yet.  She has had worsening swelling in her feet and ankles as well.  Denies chest tightness, palpitations, numbness, syncope, falls or other injury.  Patient admits to being noncompliant with her blood pressure medications.         Home Medications Prior to Admission medications   Medication Sig Start Date End Date Taking? Authorizing Provider  acetaminophen (TYLENOL) 500 MG tablet Take 2 tablets (1,000 mg total) by mouth every 6 (six) hours as needed for fever or headache. 08/31/22   Marcine Matar, MD  albuterol (VENTOLIN HFA) 108 (90 Base) MCG/ACT inhaler Inhale 1-2 puffs into the lungs every 6 (six) hours as needed for wheezing or shortness of breath. 03/11/22   Carlisle Beers, FNP  ascorbic acid (VITAMIN C) 500 MG tablet Take 1 tablet (500 mg total) by mouth daily. 12/11/21   Storm Frisk, MD  cyclobenzaprine (FLEXERIL) 10 MG tablet Take 1 tablet (10 mg total) by mouth 2 (two) times daily as needed for muscle spasms. 12/11/21   Carroll Sage, PA-C  escitalopram (LEXAPRO) 10 MG tablet Take 1 tablet (10 mg total) by mouth daily. 09/07/22 12/06/22  Bahraini, Sarah A  furosemide (LASIX) 20 MG tablet Take 1  tablet (20 mg total) by mouth daily for 5 days then as needed for swelling. 08/31/22   Marcine Matar, MD  losartan (COZAAR) 25 MG tablet Take 1 tablet (25 mg total) by mouth daily. 08/07/22   Marcine Matar, MD  metroNIDAZOLE (METROGEL) 0.75 % vaginal gel Place 1 Applicatorful vaginally at bedtime. Apply one applicatorful to vagina at bedtime for 5 days 08/11/22   Lorriane Shire, MD  Multiple Vitamins-Minerals (MULTIVITAMIN WITH MINERALS) tablet Take 1 tablet by mouth 3 (three) times a week.    [provider]  mupirocin ointment (BACTROBAN) 2 % Apply 1 Application topically 2 (two) times daily for 1 week as needed. 06/11/22   Anders Simmonds, PA-C  nicotine (NICODERM CQ - DOSED IN MG/24 HOURS) 21 mg/24hr patch Place 1 patch (21 mg total) onto the skin daily. 08/31/22   Marcine Matar, MD  omeprazole (PRILOSEC) 20 MG capsule Take 1 capsule (20 mg total) by mouth 2 (two) times daily before a meal. 01/13/21   Derwood Kaplan, MD  prazosin (MINIPRESS) 1 MG capsule Take 1 capsule (1 mg total) by mouth at bedtime. 09/07/22 12/06/22  Bahraini, Sarah A  simethicone (MYLICON) 80 MG chewable tablet Chew 1 tablet (80 mg total) by mouth 4 (four) times daily as needed for flatulence. 06/20/21   Warden Fillers, MD  sulfamethoxazole-trimethoprim (BACTRIM DS) 800-160 MG tablet Take 1 tablet by mouth 2 (  two) times daily. 07/27/22   Lorriane Shire, MD  SUMAtriptan (IMITREX) 50 MG tablet Take 1 tablet at the start of the headache.  May repeat in 2 hours if headache does not resolve.  Max 2 hours / 24-hour. 02/10/21   Claiborne Rigg, NP  Triclosan 0.3 % LIQD Apply liquid topically daily. Use in the shower, follow with clindamycin gel after showering before applying lotion 12/10/21   Edd Arbour R, CNM  zinc gluconate 50 MG tablet Take 50 mg by mouth daily.    [provider]      Allergies    Patient has no known allergies.    Review of Systems   Review of Systems  Respiratory:   Positive for shortness of breath. Negative for chest tightness.   Cardiovascular:  Positive for chest pain and leg swelling. Negative for palpitations.  Musculoskeletal:  Positive for back pain and neck pain.  Neurological:  Negative for syncope and numbness.    Physical Exam Updated Vital Signs BP 103/75   Pulse 80   Temp 98.5 F (36.9 C)   Resp 17   LMP 09/30/2022 (Approximate)   SpO2 100%  Physical Exam Vitals and nursing note reviewed. Exam conducted with a chaperone present.  Constitutional:      General: She is not in acute distress.    Appearance: Normal appearance. She is not ill-appearing or diaphoretic.  Cardiovascular:     Rate and Rhythm: Normal rate and regular rhythm.     Pulses: Normal pulses.          Dorsalis pedis pulses are 2+ on the right side and 2+ on the left side.     Heart sounds: Normal heart sounds.  Pulmonary:     Effort: Pulmonary effort is normal. No tachypnea or respiratory distress.     Breath sounds: Normal breath sounds and air entry. No wheezing.  Chest:     Chest wall: No tenderness.  Musculoskeletal:     Cervical back: Tenderness (right sided muscular tenderness) present. No bony tenderness. No pain with movement.     Thoracic back: Normal. No tenderness or bony tenderness. Normal range of motion.     Lumbar back: Tenderness (bilateral) present. No bony tenderness. Normal range of motion. Negative right straight leg raise test and negative left straight leg raise test.     Right lower leg: 3+ Edema present.     Left lower leg: 3+ Edema present.  Skin:    General: Skin is warm and dry.     Capillary Refill: Capillary refill takes less than 2 seconds.  Neurological:     Mental Status: She is alert. Mental status is at baseline.     Sensory: Sensation is intact.     Motor: Motor function is intact.     Gait: Gait is intact.  Psychiatric:        Mood and Affect: Mood normal.        Behavior: Behavior normal.     ED Results /  Procedures / Treatments   Labs (all labs ordered are listed, but only abnormal results are displayed) Labs Reviewed  COMPREHENSIVE METABOLIC PANEL - Abnormal; Notable for the following components:      Result Value   Glucose, Bld 114 (*)    Calcium 8.4 (*)    Total Protein 6.2 (*)    Albumin 3.2 (*)    All other components within normal limits  CBC WITH DIFFERENTIAL/PLATELET - Abnormal; Notable for the following components:  MCV 76.7 (*)    All other components within normal limits  HCG, QUANTITATIVE, PREGNANCY  TROPONIN I (HIGH SENSITIVITY)  TROPONIN I (HIGH SENSITIVITY)    EKG EKG Interpretation Date/Time:  Monday October 26 2022 16:02:12 EDT Ventricular Rate:  86 PR Interval:  184 QRS Duration:  73 QT Interval:  363 QTC Calculation: 435 R Axis:   19  Text Interpretation: Sinus rhythm No significant change since last tracing Confirmed by Alvino Blood ([id number]) on 10/26/2022 6:18:25 PM  Radiology DG Chest Portable 1 View  Result Date: 10/26/2022 CLINICAL DATA:  Chest pain EXAM: PORTABLE CHEST 1 VIEW COMPARISON:  06/22/2022 FINDINGS: The heart size and mediastinal contours are within normal limits. Both lungs are clear. The visualized skeletal structures are unremarkable. Low lung volume IMPRESSION: No active disease. Electronically Signed   By: Jasmine Pang M.D.   On: 10/26/2022 17:30    Procedures Procedures    Medications Ordered in ED Medications  ketorolac (TORADOL) 30 MG/ML injection 30 mg (30 mg Intravenous Given 10/26/22 1854)    ED Course/ Medical Decision Making/ A&P                             Medical Decision Making Amount and/or Complexity of Data Reviewed Labs: ordered. Radiology: ordered.  Risk Prescription drug management.   This patient presents to the ED with chief complaint(s) of chest pain, neck pain, back pain with pertinent past medical history of hypertension (non compliant with medication).  The complaint involves an extensive  differential diagnosis and also carries with it a high risk of complications and morbidity.    The differential diagnosis includes ACS, musculoskeletal pain/strain, degenerative disc disease   The initial plan is to obtain baseline labs and chest pain workup  Additional history obtained: Records reviewed  - patient had sleep study performed on 10/18/22 and diagnosed with sleep apnea.  Patient does not yet have CPAP, but this is in the process.    Initial Assessment:   On exam, patient is resting comfortably in bed and does not appear to be in acute distress.  Heart rate is normal around 80 with regular rhythm.  ECG monitor demonstrates sinus rhythm.  Lungs clear to auscultation bilaterally with adequate tidal volume.  3+ non-pitting pedal edema.  DP pulses are 2+, sensation grossly intact.  Patient able to ambulate without assistance.  Right sided neck tenderness and bilateral lumbar tenderness.  No midline tenderness.  Patient moving all extremities appropriately.    Independent ECG/labs interpretation:  The following labs were independently interpreted:  CBC without leukocytosis or anemia.  Metabolic panel without major electrolyte disturbance.  Hepatic and renal function both normal.  Initial troponin 4, repeat 5.    Independent visualization and interpretation of imaging: I independently visualized the following imaging with scope of interpretation limited to determining acute life threatening conditions related to emergency care: chest x-ray, which revealed normal cardiac silhouette, no evidence of pleural effusion, pneumothorax, consolidation or infiltrate.   Treatment and Reassessment: Patient given IM toradol with improvement in her symptoms.    Disposition:   Workup is overall reassuring and I have very low suspicion for ACS.  Will refer patient to cardiology for outpatient workup of chest pain.  Advised patient to follow up with primary care provider regarding blood pressure, neck and  back pain.  Recommended patient take NSAIDs to help with suspected musculoskeletal pain.    The patient has been appropriately medically screened and/or  stabilized in the ED. I have low suspicion for any other emergent medical condition which would require further screening, evaluation or treatment in the ED or require inpatient management. At time of discharge the patient is hemodynamically stable and in no acute distress. I have discussed work-up results and diagnosis with patient and answered all questions. Patient is agreeable with discharge plan. We discussed strict return precautions for returning to the emergency department and they verbalized understanding.            Final Clinical Impression(s) / ED Diagnoses Final diagnoses:  Chest pain, unspecified type  Pedal edema  Neck pain  Acute bilateral low back pain without sciatica    Rx / DC Orders ED Discharge Orders          Ordered    Ambulatory referral to Cardiology       Comments: If you have not heard from the Cardiology office within the next 72 hours please call [phone].   10/26/22 2022              Melton Alar R, PA-C 10/26/22 2314    Lonell Grandchild, MD 10/27/22 1201

## 2022-10-26 NOTE — Discharge Instructions (Addendum)
Thank you for allowing me to be a part of your care today.  You were evaluated in the ED for chest pain, neck and back pain.    Your workup is overall reassuring.  I am placing an ambulatory referral to cardiology for follow up regarding your chest pain.  I recommend following up with your primary care provider also, especially regarding your blood pressure and swelling in your legs.    You were given Toradol for your pain, which is a strong anti-inflammatory.  I recommend taking 600-800 mg of ibuprofen every 6-8 hours as needed for neck and/or back pain.    Return to the ED if you develop sudden worsening of your symptoms or if you have any new concerns.
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
## 2022-10-30 ENCOUNTER — Telehealth (HOSPITAL_BASED_OUTPATIENT_CLINIC_OR_DEPARTMENT_OTHER): Payer: Self-pay | Admitting: Internal Medicine

## 2022-10-30 ENCOUNTER — Telehealth: Payer: Self-pay

## 2022-10-30 ENCOUNTER — Other Ambulatory Visit: Payer: Self-pay

## 2022-10-30 DIAGNOSIS — G4733 Obstructive sleep apnea (adult) (pediatric): Secondary | ICD-10-CM

## 2022-10-30 NOTE — Telephone Encounter (Signed)
Copied from CRM [id number]. Topic: Appointment Scheduling - Scheduling Inquiry for Clinic >> Oct 30, 2022  8:49 AM Judy Hanna wrote: Reason for CRM: The patient would like to be contacted by a member of staff to discuss concerns relatd to their appt for today 10/30/22  Please contact further

## 2022-10-30 NOTE — Progress Notes (Signed)
Virtual Visit via Video Note  I connected with Judy Hanna on 10/30/2022 at 8:49 AM by a video enabled telemedicine application and verified that I am speaking with the correct person using two identifiers.  Location: Patient: home Provider: Office   I discussed the limitations of evaluation and management by telemedicine and the availability of in person appointments. The patient expressed understanding and agreed to proceed.  History of Present Illness: Patient with history of tobacco dependence, obesity, fibroids, menstrual migraines, recurrent boils genital area. Purpose of today's visit is to discuss sleep study results. Sleep study confirms that she has moderate sleep apnea.  Sleep specialist recommended either titration study AutoPap.  Patient is uninsured.  Outpatient Encounter Medications as of 10/30/2022  Medication Sig   acetaminophen (TYLENOL) 500 MG tablet Take 2 tablets (1,000 mg total) by mouth every 6 (six) hours as needed for fever or headache.   albuterol (VENTOLIN HFA) 108 (90 Base) MCG/ACT inhaler Inhale 1-2 puffs into the lungs every 6 (six) hours as needed for wheezing or shortness of breath.   ascorbic acid (VITAMIN C) 500 MG tablet Take 1 tablet (500 mg total) by mouth daily.   cyclobenzaprine (FLEXERIL) 10 MG tablet Take 1 tablet (10 mg total) by mouth 2 (two) times daily as needed for muscle spasms.   escitalopram (LEXAPRO) 10 MG tablet Take 1 tablet (10 mg total) by mouth daily.   furosemide (LASIX) 20 MG tablet Take 1 tablet (20 mg total) by mouth daily for 5 days then as needed for swelling.   losartan (COZAAR) 25 MG tablet Take 1 tablet (25 mg total) by mouth daily.   metroNIDAZOLE (METROGEL) 0.75 % vaginal gel Place 1 Applicatorful vaginally at bedtime. Apply one applicatorful to vagina at bedtime for 5 days   Multiple Vitamins-Minerals (MULTIVITAMIN WITH MINERALS) tablet Take 1 tablet by mouth 3 (three) times a week.   mupirocin ointment (BACTROBAN) 2 % Apply 1  Application topically 2 (two) times daily for 1 week as needed.   nicotine (NICODERM CQ - DOSED IN MG/24 HOURS) 21 mg/24hr patch Place 1 patch (21 mg total) onto the skin daily.   omeprazole (PRILOSEC) 20 MG capsule Take 1 capsule (20 mg total) by mouth 2 (two) times daily before a meal.   prazosin (MINIPRESS) 1 MG capsule Take 1 capsule (1 mg total) by mouth at bedtime.   simethicone (MYLICON) 80 MG chewable tablet Chew 1 tablet (80 mg total) by mouth 4 (four) times daily as needed for flatulence.   sulfamethoxazole-trimethoprim (BACTRIM DS) 800-160 MG tablet Take 1 tablet by mouth 2 (two) times daily.   SUMAtriptan (IMITREX) 50 MG tablet Take 1 tablet at the start of the headache.  May repeat in 2 hours if headache does not resolve.  Max 2 hours / 24-hour.   Triclosan 0.3 % LIQD Apply liquid topically daily. Use in the shower, follow with clindamycin gel after showering before applying lotion   zinc gluconate 50 MG tablet Take 50 mg by mouth daily.   No facility-administered encounter medications on file as of 10/30/2022.      Observations/Objective: Middle-age obese African-American female who appears in NAD.  Assessment and Plan: 1. OSA (obstructive sleep apnea) -Discussed diagnosis and management with patient. I recommend we proceed with trial of AutoPap.  I will send a message to our caseworker to see if she can get her CPAP machine through them assistance program.  Encourage weight loss.  Advised to avoid excessive drinking of alcoholic beverages at nights as  this can make sleep apnea worse. - For home use only DME continuous positive airway pressure (CPAP)   Follow Up Instructions: As previously scheduled.   I discussed the assessment and treatment plan with the patient. The patient was provided an opportunity to ask questions and all were answered. The patient agreed with the plan and demonstrated an understanding of the instructions.   The patient was advised to call back or seek  an in-person evaluation if the symptoms worsen or if the condition fails to improve as anticipated.  I spent 5 minutes dedicated to the care of this patient on the date of this encounter to include previsit review of  This note has been created with Education officer, environmental. Any transcriptional errors are unintentional.  Jonah Blue, MD

## 2022-11-02 ENCOUNTER — Telehealth (INDEPENDENT_AMBULATORY_CARE_PROVIDER_SITE_OTHER): Payer: No Payment, Other | Admitting: Psychiatry

## 2022-11-02 ENCOUNTER — Encounter (HOSPITAL_COMMUNITY): Payer: Self-pay

## 2022-11-02 ENCOUNTER — Other Ambulatory Visit: Payer: Self-pay

## 2022-11-02 ENCOUNTER — Encounter (HOSPITAL_COMMUNITY): Payer: Self-pay | Admitting: Psychiatry

## 2022-11-02 DIAGNOSIS — F419 Anxiety disorder, unspecified: Secondary | ICD-10-CM

## 2022-11-02 DIAGNOSIS — F331 Major depressive disorder, recurrent, moderate: Secondary | ICD-10-CM

## 2022-11-02 DIAGNOSIS — F4310 Post-traumatic stress disorder, unspecified: Secondary | ICD-10-CM

## 2022-11-02 MED ORDER — ESCITALOPRAM OXALATE 10 MG PO TABS
10.0000 mg | ORAL_TABLET | Freq: Every day | ORAL | 2 refills | Status: DC
  Filled 2022-11-02: qty 30, 30d supply, fill #0

## 2022-11-02 MED ORDER — PRAZOSIN HCL 1 MG PO CAPS
1.0000 mg | ORAL_CAPSULE | Freq: Every day | ORAL | 2 refills | Status: DC
  Filled 2022-11-02: qty 30, 30d supply, fill #0

## 2022-11-02 NOTE — Patient Instructions (Signed)
Thank you for attending your appointment today.  -- We did not make any medication changes today. Please continue medications as prescribed.  Please do not make any changes to medications without first discussing with your provider. If you are experiencing a psychiatric emergency, please call 911 or present to your nearest emergency department. Additional crisis, medication management, and therapy resources are included below.  [facility]  [address], [place], [place] [address] [phone] WALK-IN URGENT CARE 24/7 FOR ANYONE [address], , [place]  [phone] Fax: [phone] [email or web address] *Interpreters available *Accepts all insurance and uninsured for Urgent Care needs *Accepts Medicaid and uninsured for outpatient treatment (below)      ONLY FOR [place] Residents  Below:    Outpatient New Patient Assessment/Therapy Walk-ins:        Monday -Thursday 8am until slots are full.        Every Friday 1pm-4pm  (first come, first served)                   New Patient Psychiatry/Medication Management        Monday-Friday 8am-11am (first come, first served)               For all walk-ins we ask that you arrive by 7:15am, because patients will be seen in the order of arrival.   

## 2022-11-02 NOTE — Telephone Encounter (Signed)
Patient was seen on 10/30/2022 for a telehealth visit. All concerns were addressed.

## 2022-11-03 ENCOUNTER — Telehealth: Payer: Self-pay | Admitting: Internal Medicine

## 2022-11-03 ENCOUNTER — Telehealth: Payer: Self-pay

## 2022-11-03 NOTE — Telephone Encounter (Signed)
-----   Message from Robyne Peers, RN sent at 11/03/2022  2:38 PM EDT ----- I just spoke to her and we can assist her that way; but I would like to see if she qualifies for Medicaid first. Then she could get a new machine.  She agreed to let me refer her to the Medicaid caseworkers upstairs.  They will call her and can begin, and possibly complete, a Medicaid app ----- Message ----- From: Marcine Matar, MD Sent: 10/30/2022  12:30 PM EDT To: Robyne Peers, RN  Patient has sleep apnea.  She does not have any insurance.  Can we work with trying to get her CPAP through a patient assistance program?  I told her that she will call her.

## 2022-11-03 NOTE — Telephone Encounter (Signed)
Message received from Dr Laural Benes stating that the patient is in need of a CPAP machine but  is uninsured.   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  She was not sure if she would qualify for Medicaid but was agreeable to having me refer  her to the [facility] eligibility caseworkers to determine if she would qualify for Medicaid. She was in agreement to placing that referral.    I explained to her that if she qualifies for Medicaid, we will be able to order a new CPAP machine for her through a [employer] company.  If she does not qualify, we have funding to be able to order a refurbished machine for her through the [employer]- CPAP Assistance Program.  She said she understood.  Email sent to [facility] eligibility caseworkers requesting they call the patient and assist with submitting an application if she is eligible.

## 2022-11-06 ENCOUNTER — Other Ambulatory Visit: Payer: Self-pay

## 2022-11-08 NOTE — Progress Notes (Signed)
Cardiology Office Note:    Date:  11/10/2022   ID:  Judy Hanna, DOB 1985/06/19, MRN [id number]  PCP:  Marcine Matar, MD  Cardiologist:  Little Ishikawa, MD  Electrophysiologist:  None   Referring MD: Marcine Matar, MD   Chief Complaint  Patient presents with   Chest Pain    History of Present Illness:    Judy Hanna is a 37 y.o. female with a hx of morbid obesity, asthma, tobacco use who is referred by Dr. Laural Benes for evaluation of chest pain.  She was seen in the ED for chest pain on 10/26/2022, workup unremarkable, troponins negative x 2.  She reports she was having sharp pain under her right breast.  Had occurred for 2 days intermittently, each episode lasted about 3 minutes.  Occurred at rest, was not related to exertion.  She denies any dyspnea, lightheadedness, or syncope.  Does report she has chronic lower extremity edema and takes Lasix as needed.  She denies any recent palpitations.  She smokes 0.5 packs/day, has smoked for 5 years.  Recently started walking for exercise.  Family history includes maternal grandmother has heart failure.    Past Medical History:  Diagnosis Date   Asthma    BMI 50.0-59.9, adult (HCC) 06/26/2020   Bronchitis    Depression    Fibroids    PTSD (post-traumatic stress disorder)     Past Surgical History:  Procedure Laterality Date   MYOMECTOMY N/A 06/18/2021   Procedure: ABDOMINAL MYOMECTOMY;  Surgeon: Warden Fillers, MD;  Location: [facility];  Service: Gynecology;  Laterality: N/A;   NO PAST SURGERIES      Current Medications: Current Meds  Medication Sig   acetaminophen (TYLENOL) 500 MG tablet Take 2 tablets (1,000 mg total) by mouth every 6 (six) hours as needed for fever or headache.   albuterol (VENTOLIN HFA) 108 (90 Base) MCG/ACT inhaler Inhale 1-2 puffs into the lungs every 6 (six) hours as needed for wheezing or shortness of breath.   ascorbic acid (VITAMIN C) 500 MG tablet Take 1 tablet (500 mg total) by mouth  daily.   cyclobenzaprine (FLEXERIL) 10 MG tablet Take 1 tablet (10 mg total) by mouth 2 (two) times daily as needed for muscle spasms.   escitalopram (LEXAPRO) 10 MG tablet Take 1 tablet (10 mg total) by mouth daily.   furosemide (LASIX) 20 MG tablet Take 1 tablet (20 mg total) by mouth daily for 5 days then as needed for swelling.   losartan (COZAAR) 25 MG tablet Take 1 tablet (25 mg total) by mouth daily.   metroNIDAZOLE (METROGEL) 0.75 % vaginal gel Place 1 Applicatorful vaginally at bedtime. Apply one applicatorful to vagina at bedtime for 5 days   Multiple Vitamins-Minerals (MULTIVITAMIN WITH MINERALS) tablet Take 1 tablet by mouth 3 (three) times a week.   mupirocin ointment (BACTROBAN) 2 % Apply 1 Application topically 2 (two) times daily for 1 week as needed.   nicotine (NICODERM CQ - DOSED IN MG/24 HOURS) 21 mg/24hr patch Place 1 patch (21 mg total) onto the skin daily.   omeprazole (PRILOSEC) 20 MG capsule Take 1 capsule (20 mg total) by mouth 2 (two) times daily before a meal.   prazosin (MINIPRESS) 1 MG capsule Take 1 capsule (1 mg total) by mouth at bedtime.   simethicone (MYLICON) 80 MG chewable tablet Chew 1 tablet (80 mg total) by mouth 4 (four) times daily as needed for flatulence.   sulfamethoxazole-trimethoprim (BACTRIM DS) 800-160 MG tablet Take  1 tablet by mouth 2 (two) times daily.   SUMAtriptan (IMITREX) 50 MG tablet Take 1 tablet at the start of the headache.  May repeat in 2 hours if headache does not resolve.  Max 2 hours / 24-hour.   Triclosan 0.3 % LIQD Apply liquid topically daily. Use in the shower, follow with clindamycin gel after showering before applying lotion   zinc gluconate 50 MG tablet Take 50 mg by mouth daily.     Allergies:   Patient has no known allergies.   Social History   Socioeconomic History   Marital status: Single    Spouse name: Not on file   Number of children: 0   Years of education: Not on file   Highest education level: Some college,  no degree  Occupational History   Occupation: home health care  Tobacco Use   Smoking status: Every Day    Current packs/day: 1.00    Average packs/day: 1 pack/day for 3.0 years (3.0 ttl pk-yrs)    Types: Cigarettes   Smokeless tobacco: Never  Vaping Use   Vaping status: Never Used  Substance and Sexual Activity   Alcohol use: Yes    Comment: 1-2 drinks 2x weekly   Drug use: Not Currently    Types: Marijuana   Sexual activity: Yes    Partners: Female  Other Topics Concern   Not on file  Social History Narrative   ** Merged History Encounter **       Social Determinants of Health   Financial Resource Strain: Medium Risk (08/07/2022)   Overall Financial Resource Strain (CARDIA)    Difficulty of Paying Living Expenses: Somewhat hard  Food Insecurity: No Food Insecurity (08/07/2022)   Hunger Vital Sign    Worried About Running Out of Food in the Last Year: Never true    Ran Out of Food in the Last Year: Never true  Transportation Needs: No Transportation Needs (08/07/2022)   PRAPARE - Administrator, Civil Service (Medical): No    Lack of Transportation (Non-Medical): No  Physical Activity: Inactive (08/07/2022)   Exercise Vital Sign    Days of Exercise per Week: 0 days    Minutes of Exercise per Session: 0 min  Stress: Stress Concern Present (08/07/2022)   [employer] of Occupational Health - Occupational Stress Questionnaire    Feeling of Stress : Rather much  Social Connections: Socially Isolated (08/07/2022)   Social Connection and Isolation Panel [NHANES]    Frequency of Communication with Friends and Family: Three times a week    Frequency of Social Gatherings with Friends and Family: Three times a week    Attends Religious Services: Never    Active Member of Clubs or Organizations: No    Attends Banker Meetings: Never    Marital Status: Never married     Family History: The patient's family history includes Hypertension in her  maternal grandmother, mother, and sister.  ROS:   Please see the history of present illness.     All other systems reviewed and are negative.  EKGs/Labs/Other Studies Reviewed:    The following studies were reviewed today:   EKG:  10/26/2022: Normal sinus rhythm, rate 86, low voltage, no ST abnormalities  Recent Labs: 10/26/2022: ALT 15; BUN 10; Creatinine, Ser 0.86; Hemoglobin 13.0; Platelets 350; Potassium 3.7; Sodium 137  Recent Lipid Panel    Component Value Date/Time   CHOL 185 06/05/2019 1131   TRIG 63 06/05/2019 1131   HDL 58 06/05/2019  1131   CHOLHDL 3.2 06/05/2019 1131   LDLCALC 115 (H) 06/05/2019 1131    Physical Exam:    VS:  BP 130/80 (BP Location: Left Arm, Patient Position: Sitting, Cuff Size: Large)   Pulse 82   Ht 5\' 9"  (1.753 m)   Wt (!) 400 lb (181.4 kg)   LMP 09/30/2022 (Approximate)   SpO2 96%   BMI 59.07 kg/m     Wt Readings from Last 3 Encounters:  11/10/22 (!) 400 lb (181.4 kg)  10/18/22 (!) 380 lb (172.4 kg)  08/31/22 (!) 404 lb 3.2 oz (183.3 kg)     GEN:  Well nourished, well developed in no acute distress HEENT: Normal NECK: No JVD; No carotid bruits LYMPHATICS: No lymphadenopathy CARDIAC: RRR, no murmurs, rubs, gallops RESPIRATORY:  Clear to auscultation without rales, wheezing or rhonchi  ABDOMEN: Soft, non-tender, non-distended MUSCULOSKELETAL: Bilateral nonpitting edema SKIN: Warm and dry NEUROLOGIC:  Alert and oriented x 3 PSYCHIATRIC:  Normal affect   ASSESSMENT:    1. Chest pain of uncertain etiology   2. Essential hypertension   3. Lower extremity edema   4. Tobacco use   5. Morbid obesity (HCC)    PLAN:    Chest pain: Description suggest noncardiac chest pain, as describes sharp right-sided pain unrelated to exertion.  No further cardiac workup recommended at this time.  Hypertension: On losartan 25 mg daily.  BP appears controlled  Lower extremity edema: Nonpitting edema on exam, likely lymphedema secondary to morbid  obesity.  On Lasix 20 mg daily as needed.  Check echocardiogram to rule out structural heart disease.  Encouraged use of compression stockings.  Tobacco use: Counseled on the risk of tobacco use and cessation strongly encouraged.  States that she was recently prescribed nicotine patches and is planning to start the patches to aid in cessation  OSA: Recently diagnosed, planning to start CPAP  Morbid obesity: Body mass index is 59.07 kg/m.  Recommend referral to healthy weight and wellness  RTC in 6 months  Medication Adjustments/Labs and Tests Ordered: Current medicines are reviewed at length with the patient today.  Concerns regarding medicines are outlined above.  Orders Placed This Encounter  Procedures   Amb Ref to Medical Weight Management   ECHOCARDIOGRAM LIMITED   No orders of the defined types were placed in this encounter.   Patient Instructions  Medication Instructions:   No changes    Lab Work: Not needed    Testing/Procedures:  Will be schedule at [facility] [address] Your physician has requested that you have an echocardiogram. Echocardiography is a painless test that uses sound waves to create images of your heart. It provides your doctor with information about the size and shape of your heart and how well your heart's chambers and valves are working. This procedure takes approximately one hour. There are no restrictions for this procedure. Please do NOT wear cologne, perfume, aftershave, or lotions (deodorant is allowed). Please arrive 15 minutes prior to your appointment time.   Follow-Up: At [facility], you and your health needs are our priority.  As part of our continuing mission to provide you with exceptional heart care, we have created designated Provider Care Teams.  These Care Teams include your primary Cardiologist (physician) and Advanced Practice Providers (APPs -  Physician Assistants and Nurse Practitioners) who all work together  to provide you with the care you need, when you need it.     Your next appointment:   6 month(s)  The format for your next appointment:   In Person  Provider:   Little Ishikawa, MD   You have been referred to  [facility]  - office will contact you   Other Instructions     recommends you purchase some compression  socks/hose from [facility] in [place] ,[place]. You do not need an prescription to purchase the items.  Address  [address] [place], [place] [address]  Phone  [phone]   Compression   strength    8-15 mmHg X 15-20 mmHg                           20-30 mmHg  30-40 mmHg.  You may also try a medical supply store, department store (i.e.- [employer] mart, [employer], [employer], specialty shoe stores ( shoe market), online like Social research officer, government ) or  Insurance claims handler medical uniform store.    Signed, Little Ishikawa, MD  11/10/2022 10:57 AM    [facility]

## 2022-11-10 ENCOUNTER — Encounter: Payer: Self-pay | Admitting: Cardiology

## 2022-11-10 ENCOUNTER — Ambulatory Visit: Payer: Medicaid Other | Attending: Cardiology | Admitting: Cardiology

## 2022-11-10 VITALS — BP 130/80 | HR 82 | Ht 69.0 in | Wt >= 6400 oz

## 2022-11-10 DIAGNOSIS — R079 Chest pain, unspecified: Secondary | ICD-10-CM

## 2022-11-10 DIAGNOSIS — I10 Essential (primary) hypertension: Secondary | ICD-10-CM

## 2022-11-10 DIAGNOSIS — R600 Localized edema: Secondary | ICD-10-CM | POA: Diagnosis not present

## 2022-11-10 DIAGNOSIS — Z720 Tobacco use: Secondary | ICD-10-CM | POA: Diagnosis not present

## 2022-11-10 NOTE — Patient Instructions (Addendum)
Medication Instructions:   No changes    Lab Work: Not needed    Testing/Procedures:  Will be schedule at [facility] [address] Your physician has requested that you have an echocardiogram. Echocardiography is a painless test that uses sound waves to create images of your heart. It provides your doctor with information about the size and shape of your heart and how well your heart's chambers and valves are working. This procedure takes approximately one hour. There are no restrictions for this procedure. Please do NOT wear cologne, perfume, aftershave, or lotions (deodorant is allowed). Please arrive 15 minutes prior to your appointment time.   Follow-Up: At [facility], you and your health needs are our priority.  As part of our continuing mission to provide you with exceptional heart care, we have created designated Provider Care Teams.  These Care Teams include your primary Cardiologist (physician) and Advanced Practice Providers (APPs -  Physician Assistants and Nurse Practitioners) who all work together to provide you with the care you need, when you need it.     Your next appointment:   6 month(s)  The format for your next appointment:   In Person  Provider:   Little Ishikawa, MD   You have been referred to  Health[facility]  weight [facility] wellness Management  - office will contact you   Other Instructions     recommends you purchase some compression  socks/hose from [facility] in [place] ,[place]. You do not need an prescription to purchase the items.  Address  [address] [place], [place] [address]  Phone  [phone]   Compression   strength    8-15 mmHg X 15-20 mmHg                           20-30 mmHg  30-40 mmHg.  You may also try a medical supply store, department store (i.e.- Wal- mart, Target, [employer], specialty shoe stores ( shoe market), online like Social research officer, government ) or  Insurance claims handler medical uniform store.

## 2022-11-18 NOTE — Telephone Encounter (Signed)
I spoke to the patient today to inquire about the status of her Medicaid application  She said she started the application over the phone with the caseworker and needs to give them one document which she plans to give them tomorrow. I told her that sometimes they are able to approve individuals on the spot and to please let us know if she is approved.   She said she certainly will. She would prefer to receive a brand new CPAP machine so she wants to see if she is eligible for Medicaid before submitting an application to the [employer]

## 2022-11-23 ENCOUNTER — Encounter (HOSPITAL_COMMUNITY): Payer: Self-pay

## 2022-11-25 ENCOUNTER — Telehealth: Payer: Self-pay | Admitting: Internal Medicine

## 2022-11-25 DIAGNOSIS — G4733 Obstructive sleep apnea (adult) (pediatric): Secondary | ICD-10-CM

## 2022-11-25 NOTE — Telephone Encounter (Signed)
I returned the call to the patient.  She explained that she received a call from [facility] Medicaid this morning and has been approved. She is in the process of contacting them now to obtain her ID#.  I told her to please call me back with that number and when it clears in our system, we can start processing the order for the CPAP.  She did not have a preference for DME companies and was agreeable to sending the referral to [facility] when the insurance clears.   I also explained to her that I can refer her to Managed Medicaid care management when we receive her ID# and they can provide her with information about her benefits.  She was appreciative of any help that can be provided.

## 2022-11-25 NOTE — Telephone Encounter (Signed)
Pt returning Erskine Squibb call concerning CPAP machine.  She has insurance now and would like to proceed.

## 2022-11-26 ENCOUNTER — Encounter (INDEPENDENT_AMBULATORY_CARE_PROVIDER_SITE_OTHER): Payer: Self-pay | Admitting: Family Medicine

## 2022-11-26 ENCOUNTER — Telehealth (INDEPENDENT_AMBULATORY_CARE_PROVIDER_SITE_OTHER): Payer: Medicaid Other | Admitting: Family Medicine

## 2022-11-26 DIAGNOSIS — I10 Essential (primary) hypertension: Secondary | ICD-10-CM | POA: Diagnosis not present

## 2022-11-26 DIAGNOSIS — Z6841 Body Mass Index (BMI) 40.0 and over, adult: Secondary | ICD-10-CM

## 2022-11-26 DIAGNOSIS — E669 Obesity, unspecified: Secondary | ICD-10-CM | POA: Diagnosis not present

## 2022-11-26 NOTE — Progress Notes (Signed)
Office: [phone]  /  Fax: [phone]   TeleHealth Visit:  This visit was completed with telemedicine (audio/video) technology. Prima has verbally consented to this TeleHealth visit. The patient is located at home, the provider is located at home. The participants in this visit include the listed provider and patient. The visit was conducted today via MyChart video.  Initial Visit  Judy Hanna was seen via virtual visit today to evaluate for treatment of obesity. She is interested in losing weight to improve overall health and reduce the risk of weight related complications. She presents today to review program treatment options, initial physical assessment, and evaluation.     Height: 5\' 9"  Weight: 400 lbs BMI: 59  She was referred by: Specialist- cardiology  When asked what else they would like to accomplish? She states: Adopt healthier eating patterns, Improve energy levels and physical activity, and Improve quality of life  Weight history: overweight as a child. Has lost and regained.   When asked how has your weight affected you? She states: Has affected self-esteem and Contributed to medical problems  Some associated conditions: Hypertension and Hyperlipidemia  Contributing factors: Nutritional, Stress, Reduced physical activity, and Other: convenience  Weight promoting medications identified: Psychotropic medications  Current nutrition plan: None  Current level of physical activity: None  Current or previous pharmacotherapy: None  Response to medication: Never tried medications  Past medical history includes:   Past Medical History:  Diagnosis Date   Asthma    BMI 50.0-59.9, adult (HCC) 06/26/2020   Bronchitis    Depression    Fibroids    PTSD (post-traumatic stress disorder)      Objective:     General:  Alert, oriented and cooperative. Patient is in no acute distress.  Respiratory: Normal respiratory effort, no problems with respiration noted   Mental Status: Normal mood and affect. Normal behavior. Normal judgment and thought content.    Assessment and Plan:   1. Hypertension Hypertension well controlled.  Medication(s): losartan 25 mg daily. Lasix 20 mg daily, prazosin 1 mg nightly. Sees cardiology- Dr. Bjorn Hanna.  BP Readings from Last 3 Encounters:  11/10/22 130/80  10/26/22 103/75  08/31/22 133/88   Lab Results  Component Value Date   CREATININE 0.86 10/26/2022   CREATININE 0.80 06/22/2022   CREATININE 0.87 05/18/2022   No results found for: "GFR"  Plan: Continue all antihypertensives at current dosages.  2. Obesity Treatment / Action Plan:  Will complete provided nutritional and psychosocial assessment questionnaire before the next appointment. Will be scheduled for indirect calorimetry to determine resting energy expenditure in a fasting state.  This will allow [place] to create a reduced calorie, high-protein meal plan to promote loss of fat mass while preserving muscle mass. Was counseled on pharmacotherapy and role as an adjunct in weight management.   Obesity Education Performed Today:  We discussed obesity as a disease and the importance of a more detailed evaluation of all the factors contributing to the disease.  We discussed the importance of long term lifestyle changes which include nutrition, exercise and behavioral modifications as well as the importance of customizing this to her specific health and social needs.  We discussed the benefits of reaching a healthier weight to alleviate the symptoms of existing conditions and reduce the risks of the biomechanical, metabolic and psychological effects of obesity.  Judy Hanna appears to be in the action stage of change and states they are ready to start intensive lifestyle modifications and behavioral modifications.  ______________________________________________________________________________  She will  be contacted by [facility] to  set up initial appointment and the first follow up appointment with a physician.   The following office policies were discussed. She voiced understanding: - Patient will be considered late at 6 minutes past appointment time.   - For the first office visit, patient needs to arrive 1 hour early, fasting except for water. Patient should arrive 15 minutes early for all other visits. -  Patient will bring completed new patient paperwork to first office visit.  If not, appointment will be rescheduled.  30 minutes was spent today on this visit including the above counseling, pre-visit chart review, and post-visit documentation.  Reviewed by clinician on day of visit: allergies, medications, problem list, medical history, surgical history, family history, social history, and previous encounter notes pertinent to obesity diagnosis.    Judy Sans, FNP

## 2022-11-27 ENCOUNTER — Telehealth: Payer: Self-pay | Admitting: Internal Medicine

## 2022-11-27 NOTE — Telephone Encounter (Signed)
Copied from CRM [id number]. Topic: General - Inquiry >> Nov 27, 2022  2:14 PM Haroldine Laws wrote: Reason for CRM: pt would like Erskine Squibb to call her back regarding the status of her medicaid.  [phone]

## 2022-12-01 NOTE — Telephone Encounter (Signed)
I spoke to the patient and confirmed that her Medicaid has cleared in our system and I can submit the order for the CPAP to a DME provider.  She was in agreement to sending the order to [facility].  I explained that they will verify insurance coverage and contact her,

## 2022-12-02 NOTE — Telephone Encounter (Signed)
Order for CPAP faxed to [facility] 

## 2022-12-03 ENCOUNTER — Encounter (HOSPITAL_COMMUNITY): Payer: Self-pay

## 2022-12-04 ENCOUNTER — Ambulatory Visit (HOSPITAL_COMMUNITY): Payer: Medicaid Other | Attending: Cardiology

## 2022-12-04 ENCOUNTER — Ambulatory Visit (INDEPENDENT_AMBULATORY_CARE_PROVIDER_SITE_OTHER): Payer: Medicaid Other | Admitting: Clinical

## 2022-12-04 DIAGNOSIS — F331 Major depressive disorder, recurrent, moderate: Secondary | ICD-10-CM | POA: Diagnosis not present

## 2022-12-04 DIAGNOSIS — R079 Chest pain, unspecified: Secondary | ICD-10-CM | POA: Diagnosis not present

## 2022-12-04 DIAGNOSIS — R600 Localized edema: Secondary | ICD-10-CM | POA: Insufficient documentation

## 2022-12-04 LAB — ECHOCARDIOGRAM COMPLETE
Area-P 1/2: 3.6 cm2
S' Lateral: 3.1 cm

## 2022-12-04 NOTE — Progress Notes (Signed)
THERAPIST PROGRESS NOTE Virtual Visit via Video Note  I connected with Judy Hanna on 12/04/22 at 10:00 AM EDT by a video enabled telemedicine application and verified that I am speaking with the correct person using two identifiers.  Location: Patient: home Provider: office   I discussed the limitations of evaluation and management by telemedicine and the availability of in person appointments. The patient expressed understanding and agreed to proceed.   Follow Up Instructions: I discussed the assessment and treatment plan with the patient. The patient was provided an opportunity to ask questions and all were answered. The patient agreed with the plan and demonstrated an understanding of the instructions.   The patient was advised to call back or seek an in-person evaluation if the symptoms worsen or if the condition fails to improve as anticipated.   Session Time: 25 minutes  Participation Level: Active  Behavioral Response: CasualAlertAnxious  Type of Therapy: Individual Therapy  Treatment Goals addressed: client will identify 3 cognitive patterns and beliefs that support depression  ProgressTowards Goals: Progressing  Interventions: CBT and Supportive  Summary:  Judy Hanna is a 37 y.o. female who presents for the scheduled appointment oriented times five, appropriately dressed and friendly. Client denied hallucinations and delusions. Client reported she has been doing fairly okay. Client reported she went to a doctors appointment due to chest pain. Client reported she had a cardiogram done to ensure nothing was of major concern. Client reported she has been having feelings of not wanting to live in [place] anymore. Client reported she has been doing some thinking because she does not want to make decisions based on emotions and being impulsive. Client reported her older female cousin passed away and she will travel to [place] next week to see family. Client  reported she will also see her nephew, the son of her brother who passed away. Client reported it has been difficult to coordinate a visit because of his mom. Client reported she does not want him to think his father side of the family doesn't love him since his father is gone. Client reported her PCP referred her to the weight loss center. Client reported she wants to work on the emotional eating she tend to do periodically.  Evidence of progress towards goal:  client reported practicing at least 1 skill of delaying decision making to ensure it is not based on emotions.   Suicidal/Homicidal: Nowithout intent/plan  Therapist Response:  Therapist began the appointment asking the client how she has been doing. Therapist used CBT to engage using active listening and positive emotional support. Therapist used CBT to engage and give the client time to discuss her thoughts about health, family and work. Therapist used CBT to engage and discuss emotion regulation and encourage her to continue to prioritize caring for herself. Therapist used CBT ask the client to identify her progress with frequency of use with coping skills with continued practice in her daily activity.    Therapist assigned the client homework to practice self care.   Plan: Return again in 3 weeks.  Diagnosis: mdd, recurrent, moderate  Collaboration of Care: Patient refused AEB none requested by the client.  Patient/Guardian was advised Release of Information must be obtained prior to any record release in order to collaborate their care with an outside provider. Patient/Guardian was advised if they have not already done so to contact the registration department to sign all necessary forms in order for [place] to release information regarding their care.  Consent: Patient/Guardian gives verbal consent for treatment and assignment of benefits for services provided during this visit. Patient/Guardian expressed understanding and agreed to  proceed.   Neena Rhymes , LCSW 12/04/2022

## 2022-12-10 ENCOUNTER — Telehealth (INDEPENDENT_AMBULATORY_CARE_PROVIDER_SITE_OTHER): Payer: Self-pay | Admitting: Family Medicine

## 2022-12-16 ENCOUNTER — Telehealth: Payer: Self-pay

## 2022-12-16 NOTE — Telephone Encounter (Signed)
Copied from CRM [id number]. Topic: General - Other >> Dec 15, 2022 12:06 PM Dominique A wrote: Reason for CRM: Pt is calling to see if PCP could let her know the date and time of her CPAP class. The class is suppose to help her learn how to use he CPAP and to adjust her CPAP. Pt states lost the phone number and appointment time. Please advise.

## 2022-12-16 NOTE — Telephone Encounter (Signed)
Called & spoke to the patient. Patient stated that a text message came in today 12/16/2022 with all needed information for the class. No further questions at this time.

## 2022-12-17 ENCOUNTER — Other Ambulatory Visit: Payer: Self-pay | Admitting: Internal Medicine

## 2022-12-17 ENCOUNTER — Other Ambulatory Visit: Payer: Self-pay

## 2022-12-17 MED ORDER — METFORMIN HCL 500 MG PO TABS
500.0000 mg | ORAL_TABLET | Freq: Every day | ORAL | 2 refills | Status: DC
  Filled 2022-12-17 – 2022-12-18 (×2): qty 30, 30d supply, fill #0

## 2022-12-17 MED ORDER — FUROSEMIDE 40 MG PO TABS
40.0000 mg | ORAL_TABLET | Freq: Every day | ORAL | 2 refills | Status: DC | PRN
  Filled 2022-12-17: qty 30, 30d supply, fill #0
  Filled 2023-02-15: qty 30, 30d supply, fill #1
  Filled 2023-06-07 – 2023-09-02 (×3): qty 30, 30d supply, fill #2

## 2022-12-18 ENCOUNTER — Other Ambulatory Visit: Payer: Self-pay

## 2022-12-18 ENCOUNTER — Other Ambulatory Visit (HOSPITAL_COMMUNITY)
Admission: RE | Admit: 2022-12-18 | Discharge: 2022-12-18 | Disposition: A | Discharge status: Home / Self Care | Payer: Medicaid Other | Source: Ambulatory Visit | Attending: Family Medicine | Admitting: Family Medicine

## 2022-12-18 ENCOUNTER — Ambulatory Visit: Payer: Medicaid Other

## 2022-12-18 VITALS — BP 137/100 | HR 91 | Ht 69.0 in | Wt 396.4 lb

## 2022-12-18 DIAGNOSIS — N898 Other specified noninflammatory disorders of vagina: Secondary | ICD-10-CM | POA: Insufficient documentation

## 2022-12-18 LAB — CBC
HCT: 40.9 % (ref 35.0–45.0)
Hemoglobin: 13.6 g/dL (ref 11.7–15.5)
MCH: 26.4 pg — ABNORMAL LOW (ref 27.0–33.0)
MCHC: 33.3 g/dL (ref 32.0–36.0)
MCV: 79.4 fL — ABNORMAL LOW (ref 80.0–100.0)
MPV: 10.5 fL (ref 7.5–12.5)
Platelets: 350 10*3/uL (ref 140–400)
RBC: 5.15 10*6/uL — ABNORMAL HIGH (ref 3.80–5.10)
RDW: 15.3 % — ABNORMAL HIGH (ref 11.0–15.0)
WBC: 9.3 10*3/uL (ref 3.8–10.8)

## 2022-12-18 LAB — COMPLETE METABOLIC PANEL WITH GFR
AG Ratio: 1.4 (calc) (ref 1.0–2.5)
ALT: 10 U/L (ref 6–29)
AST: 9 U/L — ABNORMAL LOW (ref 10–30)
Albumin: 3.8 g/dL (ref 3.6–5.1)
Alkaline phosphatase (APISO): 55 U/L (ref 31–125)
BUN: 10 mg/dL (ref 7–25)
CO2: 26 mmol/L (ref 20–32)
Calcium: 9 mg/dL (ref 8.6–10.2)
Chloride: 105 mmol/L (ref 98–110)
Creat: 0.79 mg/dL (ref 0.50–0.97)
Globulin: 2.7 g/dL (ref 1.9–3.7)
Glucose, Bld: 84 mg/dL (ref 65–99)
Potassium: 4.2 mmol/L (ref 3.5–5.3)
Sodium: 138 mmol/L (ref 135–146)
Total Bilirubin: 0.6 mg/dL (ref 0.2–1.2)
Total Protein: 6.5 g/dL (ref 6.1–8.1)
eGFR: 100 mL/min/{1.73_m2} (ref >=60)

## 2022-12-18 LAB — LIPID PANEL
Cholesterol: 203 mg/dL — ABNORMAL HIGH (ref <200)
HDL: 61 mg/dL (ref >=50)
LDL Cholesterol (Calc): 119 mg/dL — ABNORMAL HIGH
Non-HDL Cholesterol (Calc): 142 mg/dL (calc) — ABNORMAL HIGH (ref <130)
Total CHOL/HDL Ratio: 3.3 (calc) (ref <5.0)
Triglycerides: 122 mg/dL (ref <150)

## 2022-12-18 LAB — VITAMIN D 25 HYDROXY (VIT D DEFICIENCY, FRACTURES): Vit D, 25-Hydroxy: 16 ng/mL — ABNORMAL LOW (ref 30–100)

## 2022-12-18 LAB — TSH: TSH: 2.6 m[IU]/L

## 2022-12-18 MED ORDER — PHENTERMINE HCL 37.5 MG PO TABS
37.5000 mg | ORAL_TABLET | Freq: Every morning | ORAL | 2 refills | Status: DC
  Filled 2022-12-18: qty 30, 30d supply, fill #0
  Filled 2023-02-15: qty 30, 30d supply, fill #1
  Filled 2023-03-22 – 2023-03-31 (×2): qty 30, 30d supply, fill #2
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
## 2022-12-18 NOTE — Progress Notes (Signed)
Pt is here today because she feels that she has BV as she has an odor with no discharge.  Pt would also like to get STI testing as well. Pt explained how to obtain self swab and that we will call with abnormal results.   Leonette Nutting  12/18/22

## 2022-12-21 ENCOUNTER — Telehealth (INDEPENDENT_AMBULATORY_CARE_PROVIDER_SITE_OTHER): Payer: Medicaid Other | Admitting: Psychiatry

## 2022-12-21 ENCOUNTER — Other Ambulatory Visit: Payer: Self-pay | Admitting: Family Medicine

## 2022-12-21 ENCOUNTER — Other Ambulatory Visit: Payer: Self-pay

## 2022-12-21 ENCOUNTER — Encounter (HOSPITAL_COMMUNITY): Payer: Self-pay | Admitting: Psychiatry

## 2022-12-21 DIAGNOSIS — F419 Anxiety disorder, unspecified: Secondary | ICD-10-CM | POA: Diagnosis not present

## 2022-12-21 DIAGNOSIS — F331 Major depressive disorder, recurrent, moderate: Secondary | ICD-10-CM | POA: Diagnosis not present

## 2022-12-21 DIAGNOSIS — F4310 Post-traumatic stress disorder, unspecified: Secondary | ICD-10-CM

## 2022-12-21 LAB — CERVICOVAGINAL ANCILLARY ONLY
Bacterial Vaginitis (gardnerella): POSITIVE — AB
Candida Glabrata: NEGATIVE
Candida Vaginitis: NEGATIVE
Chlamydia: NEGATIVE
Comment: NEGATIVE
Comment: NEGATIVE
Comment: NEGATIVE
Comment: NEGATIVE
Comment: NEGATIVE
Comment: NORMAL
Neisseria Gonorrhea: NEGATIVE
Trichomonas: NEGATIVE

## 2022-12-21 MED ORDER — VITAMIN D (ERGOCALCIFEROL) 50000 UNITS PO CAPS
1.0000 | ORAL_CAPSULE | ORAL | 5 refills | Status: DC
  Filled 2022-12-21: qty 4, 28d supply, fill #0
  Filled 2023-04-22: qty 4, 28d supply, fill #1

## 2022-12-21 MED ORDER — PRAZOSIN HCL 1 MG PO CAPS
1.0000 mg | ORAL_CAPSULE | Freq: Every day | ORAL | 2 refills | Status: DC
  Filled 2022-12-21: qty 30, 30d supply, fill #0

## 2022-12-21 MED ORDER — METRONIDAZOLE 0.75 % VA GEL
1.0000 | Freq: Every day | VAGINAL | 0 refills | Status: AC
  Filled 2022-12-21: qty 70, 7d supply, fill #0

## 2022-12-21 MED ORDER — ESCITALOPRAM OXALATE 10 MG PO TABS
10.0000 mg | ORAL_TABLET | Freq: Every day | ORAL | 2 refills | Status: DC
  Filled 2022-12-21: qty 30, 30d supply, fill #0

## 2022-12-21 NOTE — Patient Instructions (Addendum)
Thank you for attending your appointment today.  -- MOVE Lexapro to nighttime -- Continue other medications as prescribed.  Please do not make any changes to medications without first discussing with your provider. If you are experiencing a psychiatric emergency, please call 911 or present to your nearest emergency department. Additional crisis, medication management, and therapy resources are included below.  [facility]  [address], [place], [place] [address] [phone] WALK-IN URGENT CARE 24/7 FOR ANYONE [address] [place], [place]  [phone] Fax: [phone] [email or web address] *Interpreters available *Accepts all insurance and uninsured for Urgent Care needs *Accepts Medicaid and uninsured for outpatient treatment (below)      ONLY FOR [facility]  Below:    Outpatient New Patient Assessment/Therapy Walk-ins:        Monday -Thursday 8am until slots are full.        Every Friday 1pm-4pm  (first come, first served)                   New Patient Psychiatry/Medication Management        Monday-Friday 8am-11am (first come, first served)               For all walk-ins we ask that you arrive by 7:15am, because patients will be seen in the order of arrival.

## 2022-12-22 ENCOUNTER — Other Ambulatory Visit: Payer: Self-pay

## 2022-12-24 ENCOUNTER — Ambulatory Visit (INDEPENDENT_AMBULATORY_CARE_PROVIDER_SITE_OTHER): Payer: Medicaid Other | Admitting: Clinical

## 2022-12-24 DIAGNOSIS — F331 Major depressive disorder, recurrent, moderate: Secondary | ICD-10-CM | POA: Diagnosis not present

## 2022-12-28 NOTE — Progress Notes (Signed)
THERAPIST PROGRESS NOTE Virtual Visit via Video Note  I connected with Judy Hanna on 12/24/2022 at 11:00 AM EDT by a video enabled telemedicine application and verified that I am speaking with the correct person using two identifiers.  Location: Patient: home Provider: office   I discussed the limitations of evaluation and management by telemedicine and the availability of in person appointments. The patient expressed understanding and agreed to proceed.   Follow Up Instructions: I discussed the assessment and treatment plan with the patient. The patient was provided an opportunity to ask questions and all were answered. The patient agreed with the plan and demonstrated an understanding of the instructions.   The patient was advised to call back or seek an in-person evaluation if the symptoms worsen or if the condition fails to improve as anticipated.   Session Time: 30 minutes  Participation Level: Active  Behavioral Response: CasualAlertEuthymic  Type of Therapy: Individual Therapy  Treatment Goals addressed: client will identify 3 cognitive patterns and beliefs that support depression  ProgressTowards Goals: Progressing  Interventions: CBT and Supportive  Summary:  Judy Hanna is a 37 y.o. female who presents for the scheduled appointment oriented times five, appropriately dressed and friendly. Client denied hallucinations and delusions. Client reported she is doing fairly okay but still has some depression. Client reported she met with the psychiatrist to talk about it. Client reported talking with the doctor about a possible bipolar diagnosis. Client reported she has mood swings between being depressed and crying for no identifiable reason. Client reported she can be having a good day and irritability hits her. Client reported the trauma from childhood does not impact her and has learned how to manage it. Client reported she thinks partially she is affected by her friend  living with her. Client reported it has been over a year now and she can be very rude. Client reported she is considering asking her to move out. Client reported she was unable to go to [place] to see her nephew but she did call to talk to him. Client reported otherwise her health check came back good so she is happy about that. Client reported she has to monitor her stress. Evidence of progress towards goal:  client reported she is medication compliant 7 days per week.  Suicidal/Homicidal: Nowithout intent/plan  Therapist Response:  Therapist began the appointment asking the client how she has been doing. Therapist used cbt to engage using active listening and positive emotional support. Therapist used CBT to engage and ask the client to describe the severity of her depression compared to medication compliance. Therapist used cbt to engage and discuss coping skills for depression and anxiety. Therapist used CBT ask the client to identify her progress with frequency of use with coping skills with continued practice in her daily activity.    Therapist assigned the client homework to practice journaling.    Plan: Return again in 4 weeks.  Diagnosis: major depressive disorder, recurrent episode, moderate  Collaboration of Care: Patient refused AEB none requested by the client.  Patient/Guardian was advised Release of Information must be obtained prior to any record release in order to collaborate their care with an outside provider. Patient/Guardian was advised if they have not already done so to contact the registration department to sign all necessary forms in order for [place] to release information regarding their care.   Consent: Patient/Guardian gives verbal consent for treatment and assignment of benefits for services provided during this visit. Patient/Guardian expressed understanding and  agreed to proceed.   Neena Rhymes Roarke Marciano, LCSW 12/24/2022

## 2022-12-29 ENCOUNTER — Other Ambulatory Visit: Payer: Medicaid Other

## 2022-12-29 ENCOUNTER — Encounter: Payer: Self-pay | Admitting: Pharmacist

## 2022-12-29 NOTE — Patient Outreach (Signed)
  Medicaid Managed Care Social Work Note  12/29/2022 Name:  Judy Hanna MRN:  [id number] DOB:  02/22/1986  Judy Hanna is an 37 y.o. year old female who is a primary patient of Marcine Matar, MD.  The Medicaid Managed Care Coordination team was consulted for assistance with:   dental resources  Ms. Cowherd was given information about Medicaid Managed Care Coordination team services today. [name] Patient agreed to services and verbal consent obtained.  Engaged with patient  for by telephone forinitial visit in response to referral for case management and/or care coordination services.   Assessments/Interventions:  Review of past medical history, allergies, medications, health status, including review of consultants reports, laboratory and other test data, was performed as part of comprehensive evaluation and provision of chronic care management services.  SDOH: (Social Determinant of Health) assessments and interventions performed: SDOH Interventions    Flowsheet Row Clinical Support from 08/07/2022 in [facility]  SDOH Interventions   Food Insecurity Interventions Intervention Not Indicated  Housing Interventions Intervention Not Indicated  Transportation Interventions Intervention Not Indicated  Utilities Interventions Intervention Not Indicated  Alcohol Usage Interventions Intervention Not Indicated (Score <7)  Financial Strain Interventions Intervention Not Indicated, Other (Comment)  [No copay issues with pharmacy]  Physical Activity Interventions Other (Comments)  [Walks at work but nothing formal outside of this]  Stress Interventions Provide Counseling     BSW completed a telephone outreach with patient, she states she does need dental resources. Patient states additional resources are not needed at this time, but she will take them. BSW will email patient resources for dental and community resources to [email or web address]  Advanced  Directives Status:  Not addressed in this encounter.  Care Plan                 No Known Allergies  Medications Reviewed Today   Medications were not reviewed in this encounter     Patient Active Problem List   Diagnosis Date Noted   Anxiety 07/27/2022   MDD (major depressive disorder), recurrent episode, moderate (HCC) 05/26/2022   Visit for wound check 06/25/2021   Fibroid uterus 06/18/2021   S/P myomectomy 06/18/2021   PTSD (post-traumatic stress disorder) 03/10/2021   BMI 50.0-59.9, adult (HCC) 06/26/2020   Tobacco abuse 06/26/2020   Fibroids 05/22/2020   Axillary abscess 05/22/2020   Essential hypertension 05/22/2020   Morbid obesity (HCC) 06/05/2019   Tobacco dependence 06/05/2019   Menstrual migraine without status migrainosus, not intractable 06/05/2019   History of abnormal cervical Pap smear 06/05/2019    Conditions to be addressed/monitored per PCP order:   community and dental resources  There are no care plans that you recently modified to display for this patient.   Follow up:  Patient agrees to Care Plan and Follow-up.  Plan: The Managed Medicaid care management team will reach out to the patient again over the next 30 days.  Date/time of next scheduled Social Work care management/care coordination outreach:  01/28/23  Gus Puma, Kenard Gower, [facility]  Managed [facility] Social Worker [phone]

## 2022-12-29 NOTE — Patient Instructions (Signed)
Visit Information  Judy Hanna was given information about Medicaid Managed Care team care coordination services as a part of their [facility] Medicaid benefit. Judy Hanna verbally consented to engagement with the [facility] Managed Care team.   If you are experiencing a medical emergency, please call 911 or report to your local emergency department or urgent care.   If you have a non-emergency medical problem during routine business hours, please contact your provider's office and ask to speak with a nurse.   For questions related to your [facility], please call: [phone] or visit the homepage here: [email or web address]  If you would like to schedule transportation through your [facility], please call the following number at least 2 days in advance of your appointment: [phone]   Rides for urgent appointments can also be made after hours by calling Member Services.  Call the Behavioral Health Crisis Line at [phone], at any time, 24 hours a day, 7 days a week. If you are in danger or need immediate medical attention call 911.  If you would like help to quit smoking, call 1-800-QUIT-NOW ((364)098-8698) OR Espaol: 1-855-Djelo-Ya (1-324-401-0272) o para ms informacin haga clic aqu or Text READY to 536-644 to register via text  Judy Hanna - following are the goals we discussed in your visit today:   Goals Addressed   None      Social Worker will follow up on 01/28/23.   Judy Hanna, Judy Hanna, MHA [facility]  Managed Medicaid Social Worker [phone]   Following is a copy of your plan of care:  There are no care plans that you recently modified to display for this patient.

## 2023-01-04 ENCOUNTER — Ambulatory Visit: Payer: Medicaid Other | Attending: Internal Medicine | Admitting: Internal Medicine

## 2023-01-04 ENCOUNTER — Other Ambulatory Visit: Payer: Self-pay

## 2023-01-04 ENCOUNTER — Telehealth (HOSPITAL_COMMUNITY): Payer: No Payment, Other | Admitting: Psychiatry

## 2023-01-04 ENCOUNTER — Encounter: Payer: Self-pay | Admitting: Internal Medicine

## 2023-01-04 VITALS — BP 126/84 | HR 96 | Ht 69.0 in | Wt 386.0 lb

## 2023-01-04 DIAGNOSIS — I10 Essential (primary) hypertension: Secondary | ICD-10-CM | POA: Diagnosis not present

## 2023-01-04 DIAGNOSIS — Z1159 Encounter for screening for other viral diseases: Secondary | ICD-10-CM

## 2023-01-04 DIAGNOSIS — Z6841 Body Mass Index (BMI) 40.0 and over, adult: Secondary | ICD-10-CM

## 2023-01-04 DIAGNOSIS — F17210 Nicotine dependence, cigarettes, uncomplicated: Secondary | ICD-10-CM

## 2023-01-04 DIAGNOSIS — Z113 Encounter for screening for infections with a predominantly sexual mode of transmission: Secondary | ICD-10-CM

## 2023-01-04 DIAGNOSIS — F17200 Nicotine dependence, unspecified, uncomplicated: Secondary | ICD-10-CM

## 2023-01-04 DIAGNOSIS — L0292 Furuncle, unspecified: Secondary | ICD-10-CM | POA: Diagnosis not present

## 2023-01-04 DIAGNOSIS — Z23 Encounter for immunization: Secondary | ICD-10-CM

## 2023-01-04 DIAGNOSIS — G4733 Obstructive sleep apnea (adult) (pediatric): Secondary | ICD-10-CM | POA: Diagnosis not present

## 2023-01-04 DIAGNOSIS — G43829 Menstrual migraine, not intractable, without status migrainosus: Secondary | ICD-10-CM | POA: Diagnosis not present

## 2023-01-04 DIAGNOSIS — Z2821 Immunization not carried out because of patient refusal: Secondary | ICD-10-CM

## 2023-01-04 MED ORDER — WEGOVY 0.25 MG/0.5ML ~~LOC~~ SOAJ
0.2500 mg | SUBCUTANEOUS | 1 refills | Status: DC
Start: 2023-01-04 — End: 2023-05-13
  Filled 2023-01-04 – 2023-01-22 (×3): qty 2, 28d supply, fill #0

## 2023-01-04 MED ORDER — SUMATRIPTAN SUCCINATE 50 MG PO TABS
ORAL_TABLET | ORAL | 1 refills | Status: DC
Start: 2023-01-04 — End: 2023-12-21
  Filled 2023-01-04: qty 10, 30d supply, fill #0
  Filled 2023-06-07: qty 10, 5d supply, fill #0

## 2023-01-04 MED ORDER — MUPIROCIN 2 % EX OINT
1.0000 | TOPICAL_OINTMENT | Freq: Two times a day (BID) | CUTANEOUS | 1 refills | Status: DC
Start: 2023-01-04 — End: 2023-08-17
  Filled 2023-01-04 – 2023-06-07 (×2): qty 22, 11d supply, fill #0

## 2023-01-04 NOTE — Patient Instructions (Signed)
 Semaglutide Injection (Weight Management) What is this medication? SEMAGLUTIDE (SEM a GLOO tide) promotes weight loss. It may also be used to maintain weight loss.  It works by decreasing appetite. It can be used to lower the risk of heart attack and stroke in people affected by excess weight. Changes to diet and exercise are often combined with this medication. This medicine may be used for other purposes; ask your health care provider or pharmacist if you have questions. COMMON BRAND NAME(S): [id number] What should I tell my care team before I take this medication? They need to know if you have any of these conditions: Diabetes Eye disease caused by diabetes History of depression or other mental health conditions Kidney disease Pancreatic disease Personal or family history of MEN 2, a condition that causes endocrine gland tumors Personal or family history of thyroid cancer Suicidal thoughts, plans, or attempt by you or a family member An unusual or allergic reaction to semaglutide, other medications, foods, dyes, or preservatives Pregnant or trying to get pregnant Breastfeeding How should I use this medication? This medication is injected under the skin. You will be taught how to prepare and give it. Take it as directed on the prescription label. It is given once every week (every 7 days). Keep taking it unless your care team tells you to stop. It is important that you put your used needles and pens in a special sharps container. Do not put them in a trash can. If you do not have a sharps container, call your pharmacist or care team to get one. A special MedGuide will be given to you by the pharmacist with each prescription and refill. Be sure to read this information carefully each time. This medication comes with INSTRUCTIONS FOR USE. Ask your pharmacist for directions on how to use this medication. Read the information carefully. Talk to your pharmacist or care team if you have  questions. Talk to your care team about the use of this medication in children. While it may be prescribed for children as young as 12 years for selected conditions, precautions do apply. Overdosage: If you think you have taken too much of this medicine contact a poison control center or emergency room at once. NOTE: This medicine is only for you. Do not share this medicine with others. What if I miss a dose? If you miss a dose and the next scheduled dose is more than 2 days away, take the missed dose as soon as possible. If you miss a dose and the next scheduled dose is less than 2 days away, do not take the missed dose. Take the next dose at your regular time. Do not take double or extra doses. If you miss your dose for 2 weeks or more, take the next dose at your regular time or call your care team to talk about how to restart this medication. What may interact with this medication? Insulin and other medications for diabetes This list may not describe all possible interactions. Give your health care provider a list of all the medicines, herbs, non-prescription drugs, or dietary supplements you use. Also tell them if you smoke, drink alcohol, or use illegal drugs. Some items may interact with your medicine. What should I watch for while using this medication? Visit your care team for regular checks on your progress. It may be some time before you see the benefit from this medication. Drink plenty of fluids while taking this medication. Check with your care team if you have severe  diarrhea, nausea, and vomiting, or if you sweat a lot. The loss of too much body fluid may make it dangerous for you to take this medication. This medication may affect blood sugar levels. Ask your care team if changes in diet or medications are needed if you have diabetes. Talk to your care team if you may be pregnant. Losing weight while pregnant is not advised and may cause harm to the fetus. Talk to your care team for more  information. What side effects may I notice from receiving this medication? Side effects that you should report to your care team as soon as possible: Allergic reactions--skin rash, itching, hives, swelling of the face, lips, tongue, or throat Change in vision Dehydration--increased thirst, dry mouth, feeling faint or lightheaded, headache, dark yellow or brown urine Gallbladder problems--severe stomach pain, nausea, vomiting, fever Heart palpitations--rapid, pounding, or irregular heartbeat Kidney injury--decrease in the amount of urine, swelling of the ankles, hands, or feet Pancreatitis--severe stomach pain that spreads to your back or gets worse after eating or when touched, fever, nausea, vomiting Thoughts of suicide or self-harm, worsening mood, feelings of depression Thyroid cancer--new mass or lump in the neck, pain or trouble swallowing, trouble breathing, hoarseness Side effects that usually do not require medical attention (report these to your care team if they continue or are bothersome): Diarrhea Loss of appetite Nausea Upset stomach This list may not describe all possible side effects. Call your doctor for medical advice about side effects. You may report side effects to FDA at 1-800-FDA-1088. Where should I keep my medication? Keep out of the reach of children and pets. Refrigeration (preferred): Store in the refrigerator. Do not freeze. Keep this medication in the original container until you are ready to take it. Get rid of any unused medication after the expiration date. Room temperature: If needed, prior to cap removal, the pen can be stored at room temperature for up to 28 days. Protect from light. If it is stored at room temperature, get rid of any unused medication after 28 days or after it expires, whichever is first. It is important to get rid of the medication as soon as you no longer need it or it is expired. You can do this in two ways: Take the medication to a  medication take-back program. Check with your pharmacy or law enforcement to find a location. If you cannot return the medication, follow the directions in the MedGuide. NOTE: This sheet is a summary. It may not cover all possible information. If you have questions about this medicine, talk to your doctor, pharmacist, or health care provider.  2024 Elsevier/Gold Standard (2022-07-15 00:00:00)

## 2023-01-04 NOTE — Progress Notes (Signed)
Patient ID: Judy Hanna, female    DOB: [birth year]  MRN: [id number]  CC: Hypertension (HTN f/u. Med refills. /Boil on L side of lower abdomen - popped 2 days ago/Requesting STI testing - blood work/Discuss flu & Tdap vax.')   Subjective: Judy Hanna is a 37 y.o. female who presents for chronic ds management. Her concerns today include:  Patient with history of tob dep, OSA on CPAP, obesity, fibroids, menstrual migraines, recurrent boils genital area.   Complains of having a boil on the left lower abdomen x a few wks that popped 2 days ago; did not notice what came out.  Still sore but not draining.    Requesting STI screening; mainly blood test.  Had vaginal swab + for BV on 12/18/2022; neg for Chlamydia/GC/Trichomonas.  Had negative HIV/Hep C/RPR neg in 07/2022.  Had female partner last yr who was HIV positive and did not tell her.  Found out later; pt was told that partner was on med and was undetectable.  Pt would like to be screen one last time for HIV, hep C and syphilis for peace of mind.  Obesity: Now that she has Medicaid, we discussed trying her with Ozempic to help with weight loss.  She is agreeable to seeing the nutritionist as well.  No family history of thyroid disease/cancer.  No personal history of thyroid cancer or pancreatitis.  OSA: Since last telehealth visit with me 10/30/2022, she has obtained her CPAP machine; went to class to learn use.  Plans to start using it tonight.  Saw cardiology in July for chest pain.  No further workup recommended at this time.  Echo was done due to lower extremity edema.  Echo was normal.  Lower extremity edema thought to be due to lymphedema associated with obesity.  HTN:  :  Takes Cozaar 25 mg daily but did not take as yet for today.  Usually takes around mid day.  Limits salt; would like BP device. Takes Furomedie PRN for LE edema  Tob dep: Indicated desire to quit smoking on last visit.  We prescribed nicotine patches but did not pick  up.  Plans to get patches today now that she has Medicaid.  MDD/GAD:  followed by [facility].  On Lexapro and Prazosin.  Couselor 1-2x/mth and psychiatrist once a mth.  Feels meds help "but I still have episodes."  No active SI.   HM:  would like flu shot. Yes to tdapt.  Declines COVID shot. Patient Active Problem List   Diagnosis Date Noted   Anxiety 07/27/2022   MDD (major depressive disorder), recurrent episode, moderate (HCC) 05/26/2022   Visit for wound check 06/25/2021   Fibroid uterus 06/18/2021   S/P myomectomy 06/18/2021   PTSD (post-traumatic stress disorder) 03/10/2021   BMI 50.0-59.9, adult (HCC) 06/26/2020   Tobacco abuse 06/26/2020   Fibroids 05/22/2020   Axillary abscess 05/22/2020   Essential hypertension 05/22/2020   Morbid obesity (HCC) 06/05/2019   Tobacco dependence 06/05/2019   Menstrual migraine without status migrainosus, not intractable 06/05/2019   History of abnormal cervical Pap smear 06/05/2019     Current Outpatient Medications on File Prior to Visit  Medication Sig Dispense Refill   acetaminophen (TYLENOL) 500 MG tablet Take 2 tablets (1,000 mg total) by mouth every 6 (six) hours as needed for fever or headache. 100 tablet 1   albuterol (VENTOLIN HFA) 108 (90 Base) MCG/ACT inhaler Inhale 1-2 puffs into the lungs every 6 (six) hours as needed for wheezing or shortness of  breath. 6.7 g 1   ascorbic acid (VITAMIN C) 500 MG tablet Take 1 tablet (500 mg total) by mouth daily. 90 tablet 2   cyclobenzaprine (FLEXERIL) 10 MG tablet Take 1 tablet (10 mg total) by mouth 2 (two) times daily as needed for muscle spasms. 20 tablet 0   escitalopram (LEXAPRO) 10 MG tablet Take 1 tablet (10 mg total) by mouth at bedtime. 30 tablet 2   furosemide (LASIX) 20 MG tablet Take 1 tablet (20 mg total) by mouth daily for 5 days then as needed for swelling. 30 tablet 1   furosemide (LASIX) 40 MG tablet Take 1 tablet (40 mg total) by mouth daily as needed for leg swelling 30 tablet 2    losartan (COZAAR) 25 MG tablet Take 1 tablet (25 mg total) by mouth daily. 90 tablet 0   metFORMIN (GLUCOPHAGE) 500 MG tablet Take 1 tablet (500 mg total) by mouth daily. 30 tablet 2   Multiple Vitamins-Minerals (MULTIVITAMIN WITH MINERALS) tablet Take 1 tablet by mouth 3 (three) times a week.     nicotine (NICODERM CQ - DOSED IN MG/24 HOURS) 21 mg/24hr patch Place 1 patch (21 mg total) onto the skin daily. 28 patch 1   omeprazole (PRILOSEC) 20 MG capsule Take 1 capsule (20 mg total) by mouth 2 (two) times daily before a meal. 60 capsule 0   phentermine (ADIPEX-P) 37.5 MG tablet Take 1 tablet (37.5 mg total) by mouth every morning. 30 tablet 2   prazosin (MINIPRESS) 1 MG capsule Take 1 capsule (1 mg total) by mouth at bedtime. 30 capsule 2   simethicone (MYLICON) 80 MG chewable tablet Chew 1 tablet (80 mg total) by mouth 4 (four) times daily as needed for flatulence. 30 tablet 0   Triclosan 0.3 % LIQD Apply liquid topically daily. Use in the shower, follow with clindamycin gel after showering before applying lotion 355 mL 3   Vitamin D, Ergocalciferol, 50000 units CAPS Take 1 capsule by mouth once a week. 4 capsule 5   zinc gluconate 50 MG tablet Take 50 mg by mouth daily.     No current facility-administered medications on file prior to visit.    No Known Allergies  Social History   Socioeconomic History   Marital status: Single    Spouse name: Not on file   Number of children: 0   Years of education: Not on file   Highest education level: Some college, no degree  Occupational History   Occupation: home health care  Tobacco Use   Smoking status: Every Day    Current packs/day: 1.00    Average packs/day: 1 pack/day for 3.0 years (3.0 ttl pk-yrs)    Types: Cigarettes   Smokeless tobacco: Never  Vaping Use   Vaping status: Never Used  Substance and Sexual Activity   Alcohol use: Yes    Comment: 1-2 drinks 2x weekly   Drug use: Not Currently    Types: Marijuana   Sexual activity:  Yes    Partners: Female  Other Topics Concern   Not on file  Social History Narrative   ** Merged History Encounter **       Social Determinants of Health   Financial Resource Strain: Medium Risk (12/29/2022)   Overall Financial Resource Strain (CARDIA)    Difficulty of Paying Living Expenses: Somewhat hard  Food Insecurity: No Food Insecurity (12/29/2022)   Hunger Vital Sign    Worried About Running Out of Food in the Last Year: Never true  Ran Out of Food in the Last Year: Never true  Transportation Needs: No Transportation Needs (12/29/2022)   PRAPARE - Administrator, Civil Service (Medical): No    Lack of Transportation (Non-Medical): No  Physical Activity: Inactive (08/07/2022)   Exercise Vital Sign    Days of Exercise per Week: 0 days    Minutes of Exercise per Session: 0 min  Stress: Stress Concern Present (08/07/2022)   [employer] of Occupational Health - Occupational Stress Questionnaire    Feeling of Stress : Rather much  Social Connections: Socially Isolated (08/07/2022)   Social Connection and Isolation Panel [NHANES]    Frequency of Communication with Friends and Family: Three times a week    Frequency of Social Gatherings with Friends and Family: Three times a week    Attends Religious Services: Never    Active Member of Clubs or Organizations: No    Attends Banker Meetings: Never    Marital Status: Never married  Intimate Partner Violence: Not At Risk (12/29/2022)   Humiliation, Afraid, Rape, and Kick questionnaire    Fear of Current or Ex-Partner: No    Emotionally Abused: No    Physically Abused: No    Sexually Abused: No    Family History  Problem Relation Age of Onset   Hypertension Mother    Hypertension Sister    Hypertension Maternal Grandmother     Past Surgical History:  Procedure Laterality Date   MYOMECTOMY N/A 06/18/2021   Procedure: ABDOMINAL MYOMECTOMY;  Surgeon: Warden Fillers, MD;  Location: [facility] OR;  Service:  Gynecology;  Laterality: N/A;   NO PAST SURGERIES      ROS: Review of Systems Negative except as stated above  PHYSICAL EXAM: BP 126/84 (BP Location: Left Arm, Patient Position: Sitting, Cuff Size: Large)   Pulse 96   Ht 5\' 9"  (1.753 m)   Wt (!) 386 lb (175.1 kg)   LMP 12/20/2022 (Approximate)   SpO2 98%   BMI 57.00 kg/m   Physical Exam  General appearance - alert, well appearing, morbidly obese AAF and in no distress Mental status - normal mood, behavior, speech, dress, motor activity, and thought processes Mouth - mucous membranes moist, pharynx normal without lesions Chest - clear to auscultation, no wheezes, rales or rhonchi, symmetric air entry Heart - normal rate, regular rhythm, normal S1, S2, no murmurs, rubs, clicks or gallops Extremities - peripheral pulses normal, no pedal edema, no clubbing or cyanosis Skin: small opening in abdominal skin wall LLQ.  No surrounding erythema or fluctuance    01/04/2023   11:28 AM 08/31/2022    4:04 PM 08/31/2022    4:02 PM  Depression screen PHQ 2/9  Decreased Interest 1 0 1  Down, Depressed, Hopeless 1 0 2  PHQ - 2 Score 2 0 3  Altered sleeping 1 0 1  Tired, decreased energy 1 0 2  Change in appetite 1 0 1  Feeling bad or failure about yourself  1 0 1  Trouble concentrating 1 0 2  Moving slowly or fidgety/restless 1 0 2  Suicidal thoughts 1 0 0  PHQ-9 Score 9 0 12       Latest Ref Rng & Units 12/17/2022    3:52 PM 10/26/2022    3:40 PM 06/22/2022   10:15 AM  CMP  Glucose 65 - 99 mg/dL 84  604  540   BUN 7 - 25 mg/dL 10  10  9    Creatinine 0.50 - 0.97  mg/dL 4.54  0.98  1.19   Sodium 135 - 146 mmol/L 138  137  138   Potassium 3.5 - 5.3 mmol/L 4.2  3.7  4.0   Chloride 98 - 110 mmol/L 105  104  106   CO2 20 - 32 mmol/L 26  24  27    Calcium 8.6 - 10.2 mg/dL 9.0  8.4  8.2   Total Protein 6.1 - 8.1 g/dL 6.5  6.2  6.7   Total Bilirubin 0.2 - 1.2 mg/dL 0.6  0.6  0.5   Alkaline Phos 38 - 126 U/L  54  51   AST 10 - 30 U/L 9  15   16    ALT 6 - 29 U/L 10  15  16     Lipid Panel     Component Value Date/Time   CHOL 203 (H) 12/17/2022 1552   CHOL 185 06/05/2019 1131   TRIG 122 12/17/2022 1552   HDL 61 12/17/2022 1552   HDL 58 06/05/2019 1131   CHOLHDL 3.3 12/17/2022 1552   LDLCALC 119 (H) 12/17/2022 1552    CBC    Component Value Date/Time   WBC 9.3 12/17/2022 1552   RBC 5.15 (H) 12/17/2022 1552   HGB 13.6 12/17/2022 1552   HGB 13.5 06/05/2019 1131   HCT 40.9 12/17/2022 1552   HCT 39.5 06/05/2019 1131   PLT 350 12/17/2022 1552   PLT 374 06/05/2019 1131   MCV 79.4 (L) 12/17/2022 1552   MCV 79 06/05/2019 1131   MCH 26.4 (L) 12/17/2022 1552   MCHC 33.3 12/17/2022 1552   RDW 15.3 (H) 12/17/2022 1552   RDW 15.0 06/05/2019 1131   LYMPHSABS 2.4 10/26/2022 1540   MONOABS 0.7 10/26/2022 1540   EOSABS 0.4 10/26/2022 1540   BASOSABS 0.0 10/26/2022 1540    ASSESSMENT AND PLAN: 1. Essential hypertension Close to goal.  She has not taken Cozaar as yet for the morning.  She will take when she returns home.  2. Recurrent boils This seems to be resolving.  Surrounding skin does not appear to be infected at this time.  I recommend warm compresses and applying mupirocin ointment. - mupirocin ointment (BACTROBAN) 2 %; Apply 1 Application topically 2 (two) times daily for 1 week as needed.  Dispense: 22 g; Refill: 1  3. OSA on CPAP Patient plans to start using her CPAP machine tonight.  Advised that she needs to use it consistently over the next 3 months with benefit for her insurance to continue to pay for it.  She expresses understanding.  4. Tobacco dependence Still wanting to quit.  She plans to pick up prescription for nicotine patches today from the pharmacy.  5. Morbid obesity (HCC) Discussed starting her on Wegovy if covered by her insurance.  I went over with her how the medication works.  Went over possible side effects including nausea, vomiting, pancreatitis, bowel blockage, severe  diarrhea/constipation.  Advised to expect some nausea the first several weeks of using the medicine.  Advised to stop the medicine and be seen if she develops any vomiting, abdominal pain, severe diarrhea. Start with lowest dose of 0.25 mg once a week.  Advised that she has the pharmacist show her how to do the injection once the medication is dispensed since our clinical pharmacist is on vacation this week.  All questions were answered.  Follow-up with me in 5 weeks.  Start metformin once she starts [place]. - Semaglutide-Weight Management (WEGOVY) 0.25 MG/0.5ML SOAJ; Inject 0.25 mg into the skin  once a week.  Dispense: 2 mL; Refill: 1 - Amb ref to Medical Nutrition Therapy-MNT  6. Menstrual migraine without status migrainosus, not intractable Patient requested refill on Imitrex. - SUMAtriptan (IMITREX) 50 MG tablet; Take 1 tablet at the start of the headache.  May repeat in 2 hours if headache does not resolve.  Max 2 hours / 24-hour.  Dispense: 10 tablet; Refill: 1  7. Screening for STDs (sexually transmitted diseases) - HIV antibody (with reflex) - RPR w/reflex to TrepSure  8. Need for hepatitis C screening test - Hepatitis C Antibody  9. COVID-19 vaccination declined  10. Encounter for immunization - Tdap vaccine greater than or equal to 7yo IM  Patient decided she wanted to hold off on getting the flu shot today.  Will plan to get on next visit.   Patient was given the opportunity to ask questions.  Patient verbalized understanding of the plan and was able to repeat key elements of the plan.   This documentation was completed using Paediatric nurse.  Any transcriptional errors are unintentional.  Orders Placed This Encounter  Procedures   Tdap vaccine greater than or equal to 7yo IM   HIV antibody (with reflex)   RPR w/reflex to TrepSure   Hepatitis C Antibody   Amb ref to Medical Nutrition Therapy-MNT     Requested Prescriptions   Signed Prescriptions  Disp Refills   SUMAtriptan (IMITREX) 50 MG tablet 10 tablet 1    Sig: Take 1 tablet at the start of the headache.  May repeat in 2 hours if headache does not resolve.  Max 2 hours / 24-hour.   Semaglutide-Weight Management (WEGOVY) 0.25 MG/0.5ML SOAJ 2 mL 1    Sig: Inject 0.25 mg into the skin once a week.   mupirocin ointment (BACTROBAN) 2 % 22 g 1    Sig: Apply 1 Application topically 2 (two) times daily for 1 week as needed.    Return in about 5 weeks (around 02/08/2023) for Obesity - recheck on Wegovy.  Jonah Blue, MD, FACP

## 2023-01-05 LAB — RPR W/REFLEX TO TREPSURE: RPR: NONREACTIVE

## 2023-01-05 LAB — TREPONEMAL ANTIBODIES, TPPA: Treponemal Antibodies, TPPA: NONREACTIVE

## 2023-01-05 LAB — HEPATITIS C ANTIBODY: Hep C Virus Ab: NONREACTIVE

## 2023-01-05 LAB — HIV ANTIBODY (ROUTINE TESTING W REFLEX): HIV Screen 4th Generation wRfx: NONREACTIVE

## 2023-01-08 ENCOUNTER — Other Ambulatory Visit: Payer: Self-pay

## 2023-01-11 ENCOUNTER — Other Ambulatory Visit: Payer: Self-pay

## 2023-01-13 ENCOUNTER — Telehealth: Payer: Self-pay

## 2023-01-13 ENCOUNTER — Other Ambulatory Visit: Payer: Self-pay

## 2023-01-13 NOTE — Telephone Encounter (Signed)
Pharmacy Patient Advocate Encounter  Received notification from [facility] MEDICAID that Prior Authorization for [facility] has been DENIED.  Full denial letter will be uploaded to the media tab. See denial reason below.   PA #/Case ID/Reference #: [id number]  DOCUMENTATION OF LIFESTYLE CHANGES REQUIRED.  APPEAL FAXED TO [employer] MEDICAID TODAY.

## 2023-01-14 ENCOUNTER — Other Ambulatory Visit: Payer: Self-pay

## 2023-01-15 ENCOUNTER — Other Ambulatory Visit: Payer: Self-pay

## 2023-01-18 ENCOUNTER — Other Ambulatory Visit: Payer: Self-pay

## 2023-01-19 ENCOUNTER — Other Ambulatory Visit: Payer: Self-pay

## 2023-01-20 ENCOUNTER — Ambulatory Visit: Payer: Medicaid Other | Admitting: Registered"

## 2023-01-22 ENCOUNTER — Other Ambulatory Visit: Payer: Self-pay

## 2023-01-25 ENCOUNTER — Other Ambulatory Visit: Payer: Self-pay

## 2023-01-27 ENCOUNTER — Other Ambulatory Visit: Payer: Self-pay

## 2023-01-28 ENCOUNTER — Other Ambulatory Visit: Payer: Medicaid Other

## 2023-01-28 NOTE — Patient Instructions (Signed)
Visit Information  Ms. Vickroy was given information about Medicaid Managed Care team care coordination services as a part of their [facility] Medicaid benefit. Carolyna Yerian verbally consented to engagement with the [facility] Managed Care team.   If you are experiencing a medical emergency, please call 911 or report to your local emergency department or urgent care.   If you have a non-emergency medical problem during routine business hours, please contact your provider's office and ask to speak with a nurse.   For questions related to your [facility], please call: [phone] or visit the homepage here: [email or web address]  If you would like to schedule transportation through your [facility], please call the following number at least 2 days in advance of your appointment: [phone]   Rides for urgent appointments can also be made after hours by calling Member Services.  Call the Behavioral Health Crisis Line at [phone], at any time, 24 hours a day, 7 days a week. If you are in danger or need immediate medical attention call 911.  If you would like help to quit smoking, call 1-800-QUIT-NOW (4121371521) OR Espaol: 1-855-Djelo-Ya (3-875-643-3295) o para ms informacin haga clic aqu or Text READY to 188-416 to register via text  Ms. Mcqueen - following are the goals we discussed in your visit today:   Goals Addressed   None     The  Patient                                              has been provided with contact information for the Managed Medicaid care management team and has been advised to call with any health related questions or concerns.  Gus Puma, Kenard Gower, MHA [facility]  Managed Medicaid Social Worker [phone]   Following is a copy of your plan of care:  There are no care plans that you recently modified to display for this  patient.

## 2023-01-28 NOTE — Patient Outreach (Signed)
Medicaid Managed Care Social Work Note  01/28/2023 Name:  Judy Hanna MRN:  [id number] DOB:  17-Dec-1985  Judy Hanna is an 37 y.o. year old female who is a primary patient of Marcine Matar, MD.  The Medicaid Managed Care Coordination team was consulted for assistance with:  Community Resources  Dental resources  Ms. Ruscio was given information about Medicaid Managed Care Coordination team services today. Judy Hanna Patient agreed to services and verbal consent obtained.  Engaged with patient  for by telephone forfollow up visit in response to referral for case management and/or care coordination services.   Assessments/Interventions:  Review of past medical history, allergies, medications, health status, including review of consultants reports, laboratory and other test data, was performed as part of comprehensive evaluation and provision of chronic care management services.  SDOH: (Social Determinant of Health) assessments and interventions performed: SDOH Interventions    Flowsheet Row Office Visit from 01/04/2023 in [facility] Clinical Support from 08/07/2022 in [facility]  SDOH Interventions    Food Insecurity Interventions -- Intervention Not Indicated  Housing Interventions -- Intervention Not Indicated  Transportation Interventions -- Intervention Not Indicated  Utilities Interventions -- Intervention Not Indicated  Alcohol Usage Interventions -- Intervention Not Indicated (Score <7)  Depression Interventions/Treatment  Currently on Treatment --  Financial Strain Interventions -- Intervention Not Indicated, Other (Comment)  [No copay issues with pharmacy]  Physical Activity Interventions -- Other (Comments)  [Walks at work but nothing formal outside of this]  Stress Interventions -- Provide Counseling     BSW completed a telephone outreach with patient, she states she did receive the resources BSW sent  her. No other resources are needed at this time. BSW provided patient with contact information for any future needs.  Advanced Directives Status:  Not addressed in this encounter.  Care Plan                 No Known Allergies  Medications Reviewed Today   Medications were not reviewed in this encounter     Patient Active Problem List   Diagnosis Date Noted   Anxiety 07/27/2022   MDD (major depressive disorder), recurrent episode, moderate (HCC) 05/26/2022   Visit for wound check 06/25/2021   Fibroid uterus 06/18/2021   S/P myomectomy 06/18/2021   PTSD (post-traumatic stress disorder) 03/10/2021   BMI 50.0-59.9, adult (HCC) 06/26/2020   Tobacco abuse 06/26/2020   Fibroids 05/22/2020   Axillary abscess 05/22/2020   Essential hypertension 05/22/2020   Morbid obesity (HCC) 06/05/2019   Tobacco dependence 06/05/2019   Menstrual migraine without status migrainosus, not intractable 06/05/2019   History of abnormal cervical Pap smear 06/05/2019    Conditions to be addressed/monitored per PCP order:   dental and community resources  There are no care plans that you recently modified to display for this patient.   Follow up:  Patient agrees to Care Plan and Follow-up.  Plan: The  Patient has been provided with contact information for the Managed Medicaid care management team and has been advised to call with any health related questions or concerns.   Judy Hanna, MHA [facility]  Managed [facility] Social Worker [phone]

## 2023-02-10 IMAGING — CR DG TIBIA/FIBULA 2V*R*
4 series · 4 of 4 positions shown · non-contrast
Comparison: None.

CLINICAL DATA: Initial evaluation for acute pain for 3 weeks, no
injury.

EXAM:
RIGHT TIBIA AND FIBULA - 2 VIEW

[t tib-fib ap right (1 of 2)]
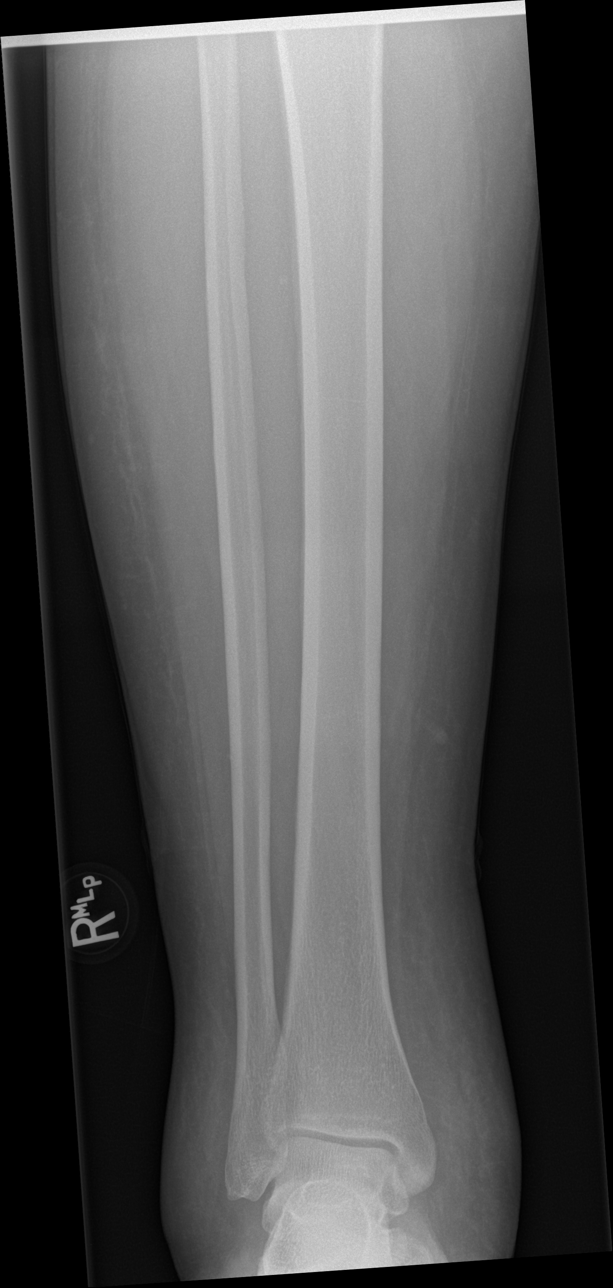

[t tib-fib ap right (2 of 2)]
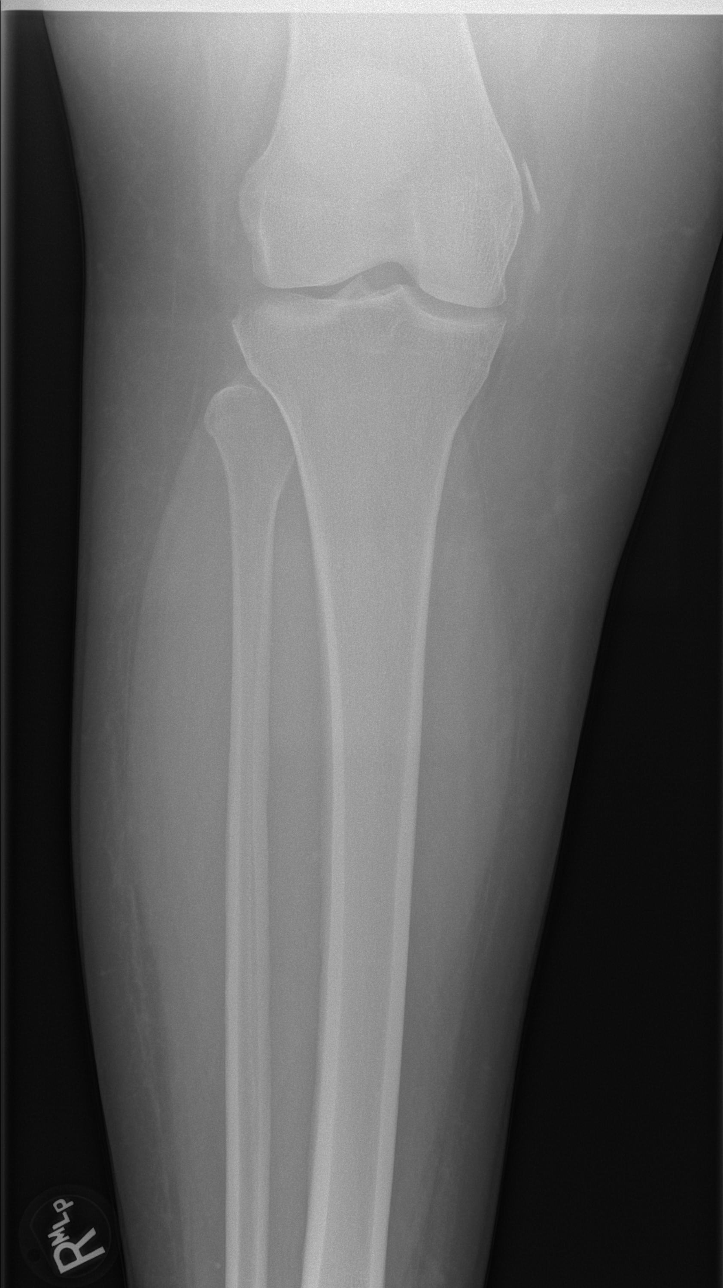

[t tib-fib lat right (1 of 2)]
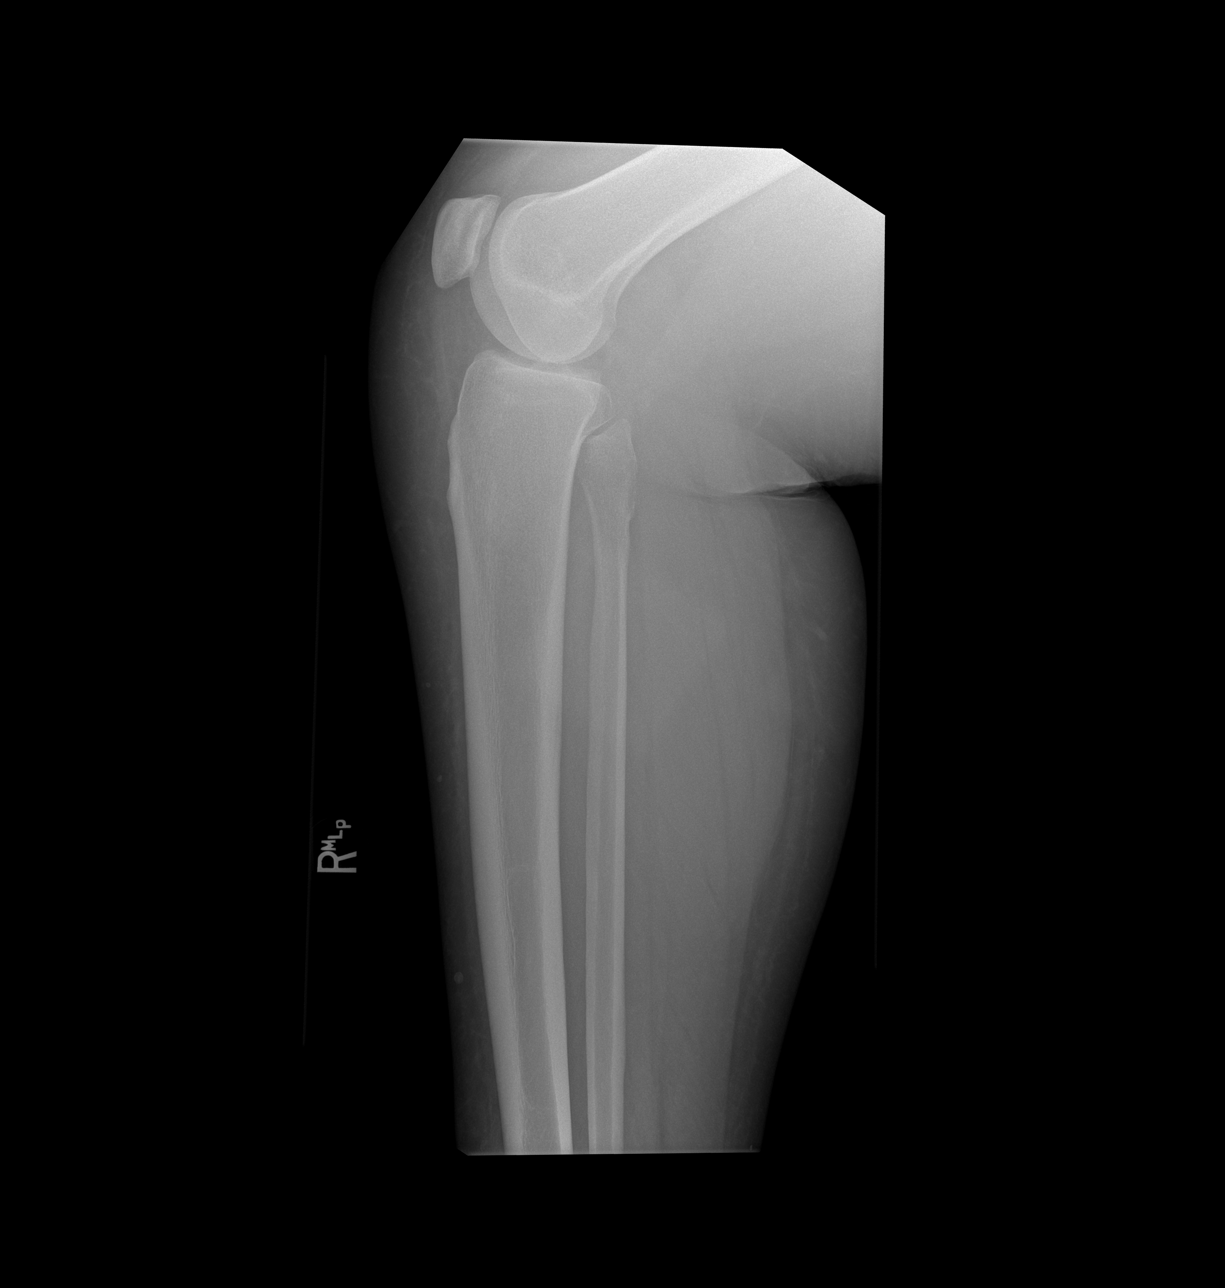

[t tib-fib lat right (2 of 2)]
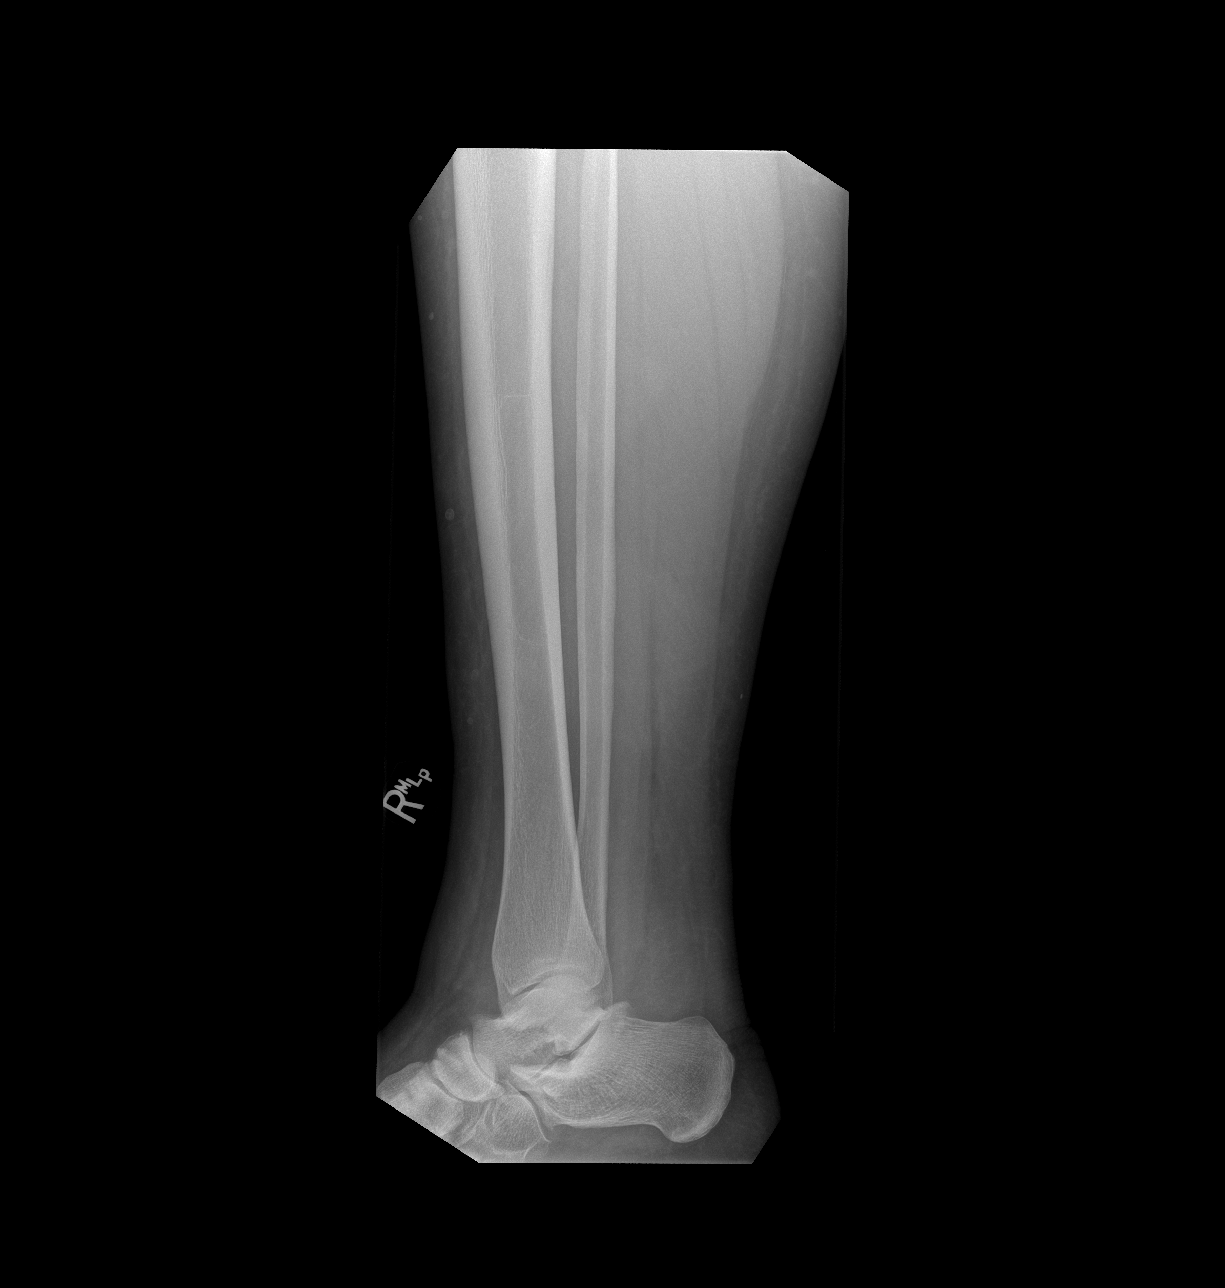

[4 of 4 positions shown; findings below may reference images not displayed]

FINDINGS: No acute fracture or dislocation. Osseous mineralization normal. No
discrete osseous lesion. Pellegrini-Stieda lesion noted at the knee.
No visible soft tissue abnormality.
IMPRESSION: 1. No acute osseous abnormality about the right tibia/fibula.
2. Pellegrini-Stieda lesion at the right knee.

## 2023-02-15 ENCOUNTER — Other Ambulatory Visit: Payer: Self-pay

## 2023-02-15 ENCOUNTER — Ambulatory Visit: Payer: Medicaid Other | Admitting: Internal Medicine

## 2023-02-16 ENCOUNTER — Other Ambulatory Visit: Payer: Self-pay

## 2023-02-17 ENCOUNTER — Other Ambulatory Visit: Payer: Self-pay

## 2023-02-17 MED ORDER — METFORMIN HCL 500 MG PO TABS
500.0000 mg | ORAL_TABLET | Freq: Every day | ORAL | 2 refills | Status: DC
  Filled 2023-02-17 – 2023-06-07 (×2): qty 30, 30d supply, fill #0

## 2023-02-17 MED ORDER — FUROSEMIDE 40 MG PO TABS
40.0000 mg | ORAL_TABLET | Freq: Every day | ORAL | 2 refills | Status: DC | PRN
  Filled 2023-02-17 – 2023-04-22 (×2): qty 30, 30d supply, fill #0
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
## 2023-02-22 ENCOUNTER — Encounter (HOSPITAL_COMMUNITY): Payer: Self-pay | Admitting: Psychiatry

## 2023-02-22 ENCOUNTER — Other Ambulatory Visit: Payer: Self-pay

## 2023-02-22 ENCOUNTER — Telehealth (INDEPENDENT_AMBULATORY_CARE_PROVIDER_SITE_OTHER): Payer: Medicaid Other | Admitting: Psychiatry

## 2023-02-22 DIAGNOSIS — F1290 Cannabis use, unspecified, uncomplicated: Secondary | ICD-10-CM | POA: Insufficient documentation

## 2023-02-22 DIAGNOSIS — F331 Major depressive disorder, recurrent, moderate: Secondary | ICD-10-CM

## 2023-02-22 DIAGNOSIS — F4310 Post-traumatic stress disorder, unspecified: Secondary | ICD-10-CM

## 2023-02-22 MED ORDER — FLUOXETINE HCL 20 MG PO CAPS
20.0000 mg | ORAL_CAPSULE | Freq: Every day | ORAL | 1 refills | Status: DC
  Filled 2023-02-22: qty 30, 30d supply, fill #0

## 2023-02-22 NOTE — Patient Instructions (Signed)
Thank you for attending your appointment today.  -- SWITCH from Lexapro to Prozac  -- Do not restart Lexapro  -- START Prozac 20 mg daily -- Continue other medications as prescribed.  Please do not make any changes to medications without first discussing with your provider. If you are experiencing a psychiatric emergency, please call 911 or present to your nearest emergency department. Additional crisis, medication management, and therapy resources are included below.  [facility]  [address] [place], [place] [address] [phone] WALK-IN URGENT CARE 24/7 FOR ANYONE [address] [place], [place]  [phone] Fax: [phone] [email or web address] *Interpreters available *Accepts all insurance and uninsured for Urgent Care needs *Accepts Medicaid and uninsured for outpatient treatment (below)      ONLY FOR [facility]  Below:    Outpatient New Patient Assessment/Therapy Walk-ins:        Monday -Thursday 8am until slots are full.        Every Friday 1pm-4pm  (first come, first served)                   New Patient Psychiatry/Medication Management        Monday-Friday 8am-11am (first come, first served)               For all walk-ins we ask that you arrive by 7:15am, because patients will be seen in the order of arrival.

## 2023-03-01 ENCOUNTER — Other Ambulatory Visit: Payer: Self-pay

## 2023-03-04 ENCOUNTER — Other Ambulatory Visit: Payer: Self-pay

## 2023-03-15 ENCOUNTER — Telehealth: Payer: Medicaid Other | Admitting: Internal Medicine

## 2023-03-17 ENCOUNTER — Ambulatory Visit (HOSPITAL_COMMUNITY): Payer: Medicaid Other | Admitting: Clinical

## 2023-03-17 ENCOUNTER — Encounter (HOSPITAL_COMMUNITY): Payer: Self-pay

## 2023-03-20 ENCOUNTER — Emergency Department (HOSPITAL_COMMUNITY)
Admission: EM | Admit: 2023-03-20 | Discharge: 2023-03-20 | Disposition: A | Discharge status: Home / Self Care | Payer: Medicaid Other | Attending: Emergency Medicine | Admitting: Emergency Medicine

## 2023-03-20 ENCOUNTER — Encounter (HOSPITAL_COMMUNITY): Payer: Self-pay

## 2023-03-20 ENCOUNTER — Other Ambulatory Visit: Payer: Self-pay

## 2023-03-20 DIAGNOSIS — R55 Syncope and collapse: Secondary | ICD-10-CM | POA: Diagnosis not present

## 2023-03-20 DIAGNOSIS — J45909 Unspecified asthma, uncomplicated: Secondary | ICD-10-CM | POA: Diagnosis not present

## 2023-03-20 DIAGNOSIS — R42 Dizziness and giddiness: Secondary | ICD-10-CM | POA: Diagnosis present

## 2023-03-20 LAB — BASIC METABOLIC PANEL
Anion gap: 8 (ref 5–15)
BUN: 11 mg/dL (ref 6–20)
CO2: 25 mmol/L (ref 22–32)
Calcium: 8.8 mg/dL — ABNORMAL LOW (ref 8.9–10.3)
Chloride: 104 mmol/L (ref 98–111)
Creatinine, Ser: 0.81 mg/dL (ref 0.44–1.00)
GFR, Estimated: >60 mL/min (ref >60)
Glucose, Bld: 109 mg/dL — ABNORMAL HIGH (ref 70–99)
Potassium: 4 mmol/L (ref 3.5–5.1)
Sodium: 137 mmol/L (ref 135–145)

## 2023-03-20 LAB — URINALYSIS, ROUTINE W REFLEX MICROSCOPIC
Bacteria, UA: NONE SEEN
Bilirubin Urine: NEGATIVE
Glucose, UA: NEGATIVE mg/dL
Ketones, ur: NEGATIVE mg/dL
Leukocytes,Ua: NEGATIVE
Nitrite: NEGATIVE
Protein, ur: NEGATIVE mg/dL
Specific Gravity, Urine: 1.018 (ref 1.005–1.030)
pH: 6 (ref 5.0–8.0)

## 2023-03-20 LAB — CBC
HCT: 37.6 % (ref 36.0–46.0)
Hemoglobin: 13.5 g/dL (ref 12.0–15.0)
MCH: 27.6 pg (ref 26.0–34.0)
MCHC: 35.9 g/dL (ref 30.0–36.0)
MCV: 76.9 fL — ABNORMAL LOW (ref 80.0–100.0)
Platelets: 338 10*3/uL (ref 150–400)
RBC: 4.89 MIL/uL (ref 3.87–5.11)
RDW: 14.7 % (ref 11.5–15.5)
WBC: 8.6 10*3/uL (ref 4.0–10.5)
nRBC: 0 % (ref 0.0–0.2)

## 2023-03-20 LAB — HCG, SERUM, QUALITATIVE: Preg, Serum: NEGATIVE

## 2023-03-20 LAB — CBG MONITORING, ED: Glucose-Capillary: 111 mg/dL — ABNORMAL HIGH (ref 70–99)

## 2023-03-20 NOTE — Discharge Instructions (Addendum)
Your symptoms may be due to side effect of weight reduction medication such as Wegovy or phentermine.  If your symptoms persist please follow-up closely with your doctor for further care.

## 2023-03-20 NOTE — ED Triage Notes (Signed)
Patient reports being at work this AM when she fell asleep. Patient states she had difficulty waking up. Once awake states she felt lightheaded and shaky. Had not eaten today. BGL 111. Denies nausea and vomiting. No cardiac hx.

## 2023-03-20 NOTE — ED Provider Notes (Signed)
[place] EMERGENCY DEPARTMENT AT [facility] Provider Note   CSN: [id number] Arrival date & time: 03/20/23  1133     History  Chief Complaint  Patient presents with   Dizziness    Judy Hanna is a 37 y.o. female.  The history is provided by the patient and medical records. No language interpreter was used.  Dizziness    37 year old female with significant history of obesity, asthma, PTSD, depression, presenting with complaint of lightheadedness.  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  She felt very sleepy.  Her symptoms persist prompting this ER visit.  She report improvement of her symptoms but still feeling a bit drowsy.  She reported last night she had a headache but that has since resolved.  She mention her diet is about the same.  She currently started a weight reduction medication and have been taking it for the past 2 months.  She denies any heart palpitation denies any vomiting or diarrhea no urinary symptoms denies any focal numbness or focal weakness.  Home Medications Prior to Admission medications   Medication Sig Start Date End Date Taking? Authorizing Provider  acetaminophen (TYLENOL) 500 MG tablet Take 2 tablets (1,000 mg total) by mouth every 6 (six) hours as needed for fever or headache. 08/31/22   Marcine Matar, MD  albuterol (VENTOLIN HFA) 108 (90 Base) MCG/ACT inhaler Inhale 1-2 puffs into the lungs every 6 (six) hours as needed for wheezing or shortness of breath. 03/11/22   Carlisle Beers, FNP  ascorbic acid (VITAMIN C) 500 MG tablet Take 1 tablet (500 mg total) by mouth daily. 12/11/21   Storm Frisk, MD  cyclobenzaprine (FLEXERIL) 10 MG tablet Take 1 tablet (10 mg total) by mouth 2 (two) times daily as needed for muscle spasms. 12/11/21   Carroll Sage, PA-C  FLUoxetine (PROZAC) 20 MG capsule Take 1 capsule (20 mg total) by  mouth daily. 02/22/23 04/23/23  Bahraini, Sarah A  furosemide (LASIX) 20 MG tablet Take 1 tablet (20 mg total) by mouth daily for 5 days then as needed for swelling. 08/31/22   Marcine Matar, MD  furosemide (LASIX) 40 MG tablet Take 1 tablet (40 mg total) by mouth daily as needed for leg swelling 12/17/22     furosemide (LASIX) 40 MG tablet Take 1 tablet (40 mg total) by mouth daily as needed leg swelling 02/17/23     losartan (COZAAR) 25 MG tablet Take 1 tablet (25 mg total) by mouth daily. 08/07/22   Marcine Matar, MD  metFORMIN (GLUCOPHAGE) 500 MG tablet Take 1 tablet (500 mg total) by mouth daily. 12/17/22     metFORMIN (GLUCOPHAGE) 500 MG tablet Take 1 tablet (500 mg total) by mouth daily. 02/17/23     Multiple Vitamins-Minerals (MULTIVITAMIN WITH MINERALS) tablet Take 1 tablet by mouth 3 (three) times a week.    [provider]  mupirocin ointment (BACTROBAN) 2 % Apply 1 Application topically 2 (two) times daily for 1 week as needed. 01/04/23   Marcine Matar, MD  nicotine (NICODERM CQ - DOSED IN MG/24 HOURS) 21 mg/24hr patch Place 1 patch (21 mg total) onto the skin daily. 08/31/22   Marcine Matar, MD  omeprazole (PRILOSEC) 20 MG capsule Take 1 capsule (20 mg total) by mouth 2 (two) times daily before a meal. 01/13/21   Derwood Kaplan, MD  phentermine (ADIPEX-P)  37.5 MG tablet Take 1 tablet (37.5 mg total) by mouth every morning. 12/17/22     prazosin (MINIPRESS) 1 MG capsule Take 1 capsule (1 mg total) by mouth at bedtime. 12/21/22 03/21/23  Bahraini, Sarah A  Semaglutide-Weight Management (WEGOVY) 0.25 MG/0.5ML SOAJ Inject 0.25 mg into the skin once a week. 01/04/23   Marcine Matar, MD  simethicone (MYLICON) 80 MG chewable tablet Chew 1 tablet (80 mg total) by mouth 4 (four) times daily as needed for flatulence. 06/20/21   Warden Fillers, MD  SUMAtriptan (IMITREX) 50 MG tablet Take 1 tablet at the start of the headache.  May repeat in 2 hours if headache does not resolve.   Max 2 hours / 24-hour. 01/04/23   Marcine Matar, MD  Triclosan 0.3 % LIQD Apply liquid topically daily. Use in the shower, follow with clindamycin gel after showering before applying lotion 12/10/21   Edd Arbour R, CNM  Vitamin D, Ergocalciferol, 50000 units CAPS Take 1 capsule by mouth once a week. 12/21/22     zinc gluconate 50 MG tablet Take 50 mg by mouth daily.    [provider]      Allergies    Patient has no known allergies.    Review of Systems   Review of Systems  Neurological:  Positive for dizziness.  All other systems reviewed and are negative.   Physical Exam Updated Vital Signs BP (!) 159/93 (BP Location: Right Arm)   Pulse 85   Temp 98.2 F (36.8 C) (Oral)   Resp 17   Ht 5\' 9"  (1.753 m)   Wt (!) 167.8 kg   SpO2 100%   BMI 54.64 kg/m  Physical Exam Vitals and nursing note reviewed.  Constitutional:      General: She is not in acute distress.    Appearance: She is well-developed. She is obese.  HENT:     Head: Atraumatic.  Eyes:     Conjunctiva/sclera: Conjunctivae normal.  Cardiovascular:     Rate and Rhythm: Normal rate and regular rhythm.     Pulses: Normal pulses.     Heart sounds: Normal heart sounds.  Pulmonary:     Effort: Pulmonary effort is normal.  Abdominal:     Palpations: Abdomen is soft.  Musculoskeletal:        General: Normal range of motion.     Cervical back: Neck supple.  Skin:    Findings: No rash.  Neurological:     Mental Status: She is alert and oriented to person, place, and time.  Psychiatric:        Mood and Affect: Mood normal.     ED Results / Procedures / Treatments   Labs (all labs ordered are listed, but only abnormal results are displayed) Labs Reviewed  BASIC METABOLIC PANEL - Abnormal; Notable for the following components:      Result Value   Glucose, Bld 109 (*)    Calcium 8.8 (*)    All other components within normal limits  CBC - Abnormal; Notable for the following components:   MCV  76.9 (*)    All other components within normal limits  URINALYSIS, ROUTINE W REFLEX MICROSCOPIC - Abnormal; Notable for the following components:   Hgb urine dipstick LARGE (*)    All other components within normal limits  CBG MONITORING, ED - Abnormal; Notable for the following components:   Glucose-Capillary 111 (*)    All other components within normal limits  HCG, SERUM, QUALITATIVE  EKG None  Date: 03/20/2023  Rate: 72  Rhythm: normal sinus rhythm  QRS Axis: normal  Intervals: normal  ST/T Wave abnormalities: normal  Conduction Disutrbances: none  Narrative Interpretation:   Old EKG Reviewed: No significant changes noted    Radiology No results found.  Procedures Procedures    Medications Ordered in ED Medications - No data to display  ED Course/ Medical Decision Making/ A&P                                 Medical Decision Making Amount and/or Complexity of Data Reviewed Labs: ordered.   BP (!) 159/93 (BP Location: Right Arm)   Pulse 85   Temp 98.2 F (36.8 C) (Oral)   Resp 17   Ht 5\' 9"  (1.753 m)   Wt (!) 167.8 kg   SpO2 100%   BMI 54.64 kg/m   75:73 PM  37 year old female with significant history of obesity, asthma, PTSD, depression, presenting with complaint of lightheadedness.  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  She felt very sleepy.  Her symptoms persist prompting this ER visit.  She report improvement of her symptoms but still feeling a bit drowsy.  She reported last night she had a headache but that has since resolved.  She mention her diet is about the same.  She currently started a weight reduction medication and have been taking it for the past 2 months.  She denies any heart palpitation denies any vomiting or diarrhea no urinary symptoms denies any focal numbness or focal weakness.  Exam overall reassuring no focal neurodeficit no  reproducible pain and she is moving all 4 extremities with equal effort and she is mentating appropriately.  Vital sign with a blood pressure 159/93.  Patient is afebrile no hypoxia.  -Labs ordered, independently viewed and interpreted by me.  Labs remarkable for UA with large hgb likely 2/2 current menstruation. Labs are overall reassuring, no hypo/hyperglycemia.  Preg test negative, normal Hgb -The patient was maintained on a cardiac monitor.  I personally viewed and interpreted the cardiac monitored which showed an underlying rhythm of: NSR -Imaging including head CT considered but felt low yield -This patient presents to the ED for concern of lighthead, this involves an extensive number of treatment options, and is a complaint that carries with it a high risk of complications and morbidity.  The differential diagnosis includes anemia, cardiac arrhythmia  -Co morbidities that complicate the patient evaluation includes HTN, anxiety -Treatment includes monitoring -Reevaluation of the patient after these medicines showed that the patient improved -PCP office notes or outside notes reviewed -Escalation to admission/observation considered: patients feels much better, is comfortable with discharge, and will follow up with PCP -Prescription medication considered, patient comfortable with OTC meds -Social Determinant of Health considered which includes tobacco use, physical inactivity, depression, social isolation         Final Clinical Impression(s) / ED Diagnoses Final diagnoses:  Near syncope    Rx / DC Orders ED Discharge Orders     None         Fayrene Helper, PA-C 03/20/23 1434    Elayne Snare K, DO 03/20/23 1542

## 2023-03-22 ENCOUNTER — Other Ambulatory Visit: Payer: Self-pay

## 2023-03-22 MED ORDER — OZEMPIC (0.25 OR 0.5 MG/DOSE) 2 MG/3ML ~~LOC~~ SOPN
PEN_INJECTOR | SUBCUTANEOUS | 2 refills | Status: DC
  Filled 2023-03-22: qty 3, 56d supply, fill #0

## 2023-03-23 ENCOUNTER — Other Ambulatory Visit: Payer: Self-pay

## 2023-03-25 NOTE — Progress Notes (Signed)
Client was a no show, 03/17/2023

## 2023-03-26 ENCOUNTER — Other Ambulatory Visit: Payer: Self-pay

## 2023-03-29 ENCOUNTER — Other Ambulatory Visit (HOSPITAL_COMMUNITY): Payer: Self-pay

## 2023-03-29 ENCOUNTER — Other Ambulatory Visit: Payer: Self-pay

## 2023-03-31 ENCOUNTER — Other Ambulatory Visit: Payer: Self-pay

## 2023-04-02 ENCOUNTER — Other Ambulatory Visit: Payer: Self-pay

## 2023-04-02 ENCOUNTER — Telehealth: Payer: Self-pay | Admitting: Family Medicine

## 2023-04-02 MED ORDER — WEGOVY 0.25 MG/0.5ML ~~LOC~~ SOAJ
0.2500 mg | SUBCUTANEOUS | 2 refills | Status: DC
  Filled 2023-04-02: qty 2, 28d supply, fill #0

## 2023-04-02 NOTE — Telephone Encounter (Signed)
Patient want a nurse to call her, she said she have some questions about medication.

## 2023-04-02 NOTE — Telephone Encounter (Signed)
I called Judy Hanna and she explained she has some metronidazole left from a previous prescription and she wondered if she could take that for her symptoms of when when she urinates she feels like her bladder is not emptying and she is urinating small amounts and feels irritated. I explained her symptoms sound more like a UTI and metronidazole will not help that. I explained she should have a urinalysis and that since we close in 20 minutes I can't have her come in today. She already has nurse visit appointment Monday and I encouraged her to keep that appointment. I also explained if she is uncomfortable before Monday she should go to [facility]. She voices understanding Nancy Fetter

## 2023-04-05 ENCOUNTER — Other Ambulatory Visit (HOSPITAL_COMMUNITY)
Admission: RE | Admit: 2023-04-05 | Discharge: 2023-04-05 | Disposition: A | Discharge status: Home / Self Care | Payer: Medicaid Other | Source: Ambulatory Visit | Attending: Family Medicine | Admitting: Family Medicine

## 2023-04-05 ENCOUNTER — Other Ambulatory Visit: Payer: Self-pay

## 2023-04-05 ENCOUNTER — Ambulatory Visit (INDEPENDENT_AMBULATORY_CARE_PROVIDER_SITE_OTHER): Payer: Medicaid Other

## 2023-04-05 VITALS — BP 143/95 | Wt 382.6 lb

## 2023-04-05 DIAGNOSIS — R339 Retention of urine, unspecified: Secondary | ICD-10-CM

## 2023-04-05 DIAGNOSIS — N898 Other specified noninflammatory disorders of vagina: Secondary | ICD-10-CM | POA: Insufficient documentation

## 2023-04-05 DIAGNOSIS — Z113 Encounter for screening for infections with a predominantly sexual mode of transmission: Secondary | ICD-10-CM | POA: Diagnosis present

## 2023-04-05 DIAGNOSIS — N3941 Urge incontinence: Secondary | ICD-10-CM

## 2023-04-05 DIAGNOSIS — R300 Dysuria: Secondary | ICD-10-CM

## 2023-04-05 LAB — POCT URINALYSIS DIP (DEVICE)
Bilirubin Urine: NEGATIVE
Glucose, UA: NEGATIVE mg/dL
Ketones, ur: NEGATIVE mg/dL
Nitrite: NEGATIVE
Protein, ur: NEGATIVE mg/dL
Specific Gravity, Urine: 1.02 (ref 1.005–1.030)
Urobilinogen, UA: 0.2 mg/dL (ref 0.0–1.0)
pH: 7 (ref 5.0–8.0)

## 2023-04-05 NOTE — Patient Instructions (Signed)
Non-medication Remedies for Bacterial Vaginosis   Option #1   1 Tbsp Fractitionated Coconut Oil  10 drops of Melaleuca (Tea Tree) Oil   Mix ingredients together well.  Soak 3-4 tampons (in applicators) in that mixture until all or mostly all mixture is soaked up into the tampons.  Insert 1 saturated tampon vaginally and wear overnight for 3-4 nights.   You can purchase Tea Tree Oil locally at:   [employer]  [address] [address]   [place] [address]      Option #2  Fill tub with enough to cover lap/lower abdomen warm water.  Mix 1/2 cup of baking soda or a few cups of apple cider vinegar into water. Soak in bath for at least 20 minutes. Be sure to swish water in between legs to get as much in vagina as possible. This soak can be done after sexual intercourse and menstrual cycles.      Unscented is best! (and other things to remember) Soap: UNSCENTED Dove (white box light green writing)  Laundry detergent- unscented  Sanitary napkin/panty liners: UNSCENTED.  If it doesn't SAY unscented it can have a scent/perfume    NO PERFUMES OR LOTIONS in the vulvar area Condoms: Non dyed, non flavored.   Remember to change wash cloths and towels frequently to prevent reinfection! Wear loose clothes to sleep.

## 2023-04-05 NOTE — Progress Notes (Signed)
Judy Hanna is here today reporting vaginal irritation, pelvic pressure, incomplete emptying of bladder, and urge incontinence. Took single dose of vaginal Metrogel and single dose of oral metronidazole last week. Reviewed importance of completing antibiotic course when prescribed and not using at a later time. She has noticed pattern of recurrent BV; discussed good hygiene practices and non-medication options for treatment and prevention of BV.   UA today shows shows small leukocytes and trace hemoglobin. Urine culture sent due to ongoing UTI symptoms. Self swab completed by patient. Reports new partner in past few months and desires full STD screening. Explained patient will be contacted with any abnormal results.   BP elevated today x 2. History of chronic hypertension. Most recent PCP visit September 2024 and BP was normal. Pt states she is not taking any of her prescribed medications other than weight loss medications. Recommended patient follow up with PCP. Desires pregnancy in near future; reviewed importance of good BP control for healthy pregnancy.   Marjo Bicker, RN 04/05/2023  10:10 AM

## 2023-04-06 ENCOUNTER — Other Ambulatory Visit: Payer: Self-pay

## 2023-04-06 ENCOUNTER — Other Ambulatory Visit: Payer: Self-pay | Admitting: Obstetrics and Gynecology

## 2023-04-06 LAB — HEPATITIS B SURFACE ANTIGEN: Hepatitis B Surface Ag: NEGATIVE

## 2023-04-06 LAB — CERVICOVAGINAL ANCILLARY ONLY
Bacterial Vaginitis (gardnerella): POSITIVE — AB
Candida Glabrata: NEGATIVE
Candida Vaginitis: NEGATIVE
Chlamydia: NEGATIVE
Comment: NEGATIVE
Comment: NEGATIVE
Comment: NEGATIVE
Comment: NEGATIVE
Comment: NEGATIVE
Comment: NORMAL
Neisseria Gonorrhea: NEGATIVE
Trichomonas: NEGATIVE

## 2023-04-06 LAB — RPR: RPR Ser Ql: NONREACTIVE

## 2023-04-06 LAB — HEPATITIS C ANTIBODY: Hep C Virus Ab: NONREACTIVE

## 2023-04-06 LAB — HIV ANTIBODY (ROUTINE TESTING W REFLEX): HIV Screen 4th Generation wRfx: NONREACTIVE

## 2023-04-06 MED ORDER — METRONIDAZOLE 500 MG PO TABS
500.0000 mg | ORAL_TABLET | Freq: Two times a day (BID) | ORAL | 0 refills | Status: AC
  Filled 2023-04-06: qty 14, 7d supply, fill #0

## 2023-04-07 LAB — URINE CULTURE
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
## 2023-04-12 ENCOUNTER — Telehealth (HOSPITAL_COMMUNITY): Payer: Medicaid Other | Admitting: Psychiatry

## 2023-04-12 ENCOUNTER — Other Ambulatory Visit: Payer: Self-pay

## 2023-04-15 ENCOUNTER — Other Ambulatory Visit: Payer: Self-pay

## 2023-04-16 ENCOUNTER — Other Ambulatory Visit: Payer: Self-pay

## 2023-04-19 ENCOUNTER — Other Ambulatory Visit: Payer: Self-pay

## 2023-04-22 ENCOUNTER — Other Ambulatory Visit: Payer: Self-pay

## 2023-04-23 ENCOUNTER — Other Ambulatory Visit: Payer: Self-pay
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
## 2023-04-26 ENCOUNTER — Telehealth (HOSPITAL_COMMUNITY): Payer: Medicaid Other | Admitting: Psychiatry

## 2023-04-26 ENCOUNTER — Other Ambulatory Visit: Payer: Self-pay

## 2023-04-26 ENCOUNTER — Encounter (HOSPITAL_COMMUNITY): Payer: Self-pay | Admitting: Psychiatry

## 2023-04-26 DIAGNOSIS — F331 Major depressive disorder, recurrent, moderate: Secondary | ICD-10-CM | POA: Diagnosis not present

## 2023-04-26 DIAGNOSIS — F1290 Cannabis use, unspecified, uncomplicated: Secondary | ICD-10-CM

## 2023-04-26 DIAGNOSIS — F4310 Post-traumatic stress disorder, unspecified: Secondary | ICD-10-CM | POA: Diagnosis not present

## 2023-04-26 MED ORDER — FLUOXETINE HCL 20 MG PO CAPS
20.0000 mg | ORAL_CAPSULE | Freq: Every day | ORAL | 2 refills | Status: DC
  Filled 2023-04-26 – 2023-06-07 (×2): qty 30, 30d supply, fill #0

## 2023-04-26 MED ORDER — VITAMIN D (CHOLECALCIFEROL) 50 MCG (2000 UT) PO CAPS
2000.0000 [IU] | ORAL_CAPSULE | Freq: Every morning | ORAL | 5 refills | Status: AC
  Filled 2023-04-26: qty 30, 30d supply, fill #0
  Filled 2023-06-07 – 2023-07-07 (×2): qty 30, 30d supply, fill #1
  Filled 2023-07-26 – 2023-09-30 (×2): qty 30, 30d supply, fill #2

## 2023-04-26 NOTE — Patient Instructions (Signed)

## 2023-04-27 ENCOUNTER — Other Ambulatory Visit: Payer: Self-pay

## 2023-04-30 ENCOUNTER — Other Ambulatory Visit: Payer: Self-pay

## 2023-04-30 MED ORDER — PHENTERMINE HCL 37.5 MG PO TABS
37.5000 mg | ORAL_TABLET | Freq: Every morning | ORAL | 2 refills | Status: AC
Start: 2023-04-30 — End: ?
  Filled 2023-04-30: qty 30, 30d supply, fill #0
  Filled 2023-06-07: qty 30, 30d supply, fill #1
  Filled 2023-07-26: qty 30, 30d supply, fill #2

## 2023-05-03 ENCOUNTER — Other Ambulatory Visit: Payer: Self-pay

## 2023-05-04 ENCOUNTER — Other Ambulatory Visit: Payer: Self-pay

## 2023-05-06 ENCOUNTER — Other Ambulatory Visit: Payer: Self-pay

## 2023-05-10 ENCOUNTER — Other Ambulatory Visit: Payer: Self-pay

## 2023-05-10 MED ORDER — CETIRIZINE HCL 10 MG PO TABS
10.0000 mg | ORAL_TABLET | Freq: Every evening | ORAL | 2 refills | Status: AC
  Filled 2023-05-10: qty 30, 30d supply, fill #0
  Filled 2023-06-07 – 2023-07-26 (×2): qty 30, 30d supply, fill #1
  Filled 2023-09-30 (×3): qty 30, 30d supply, fill #2

## 2023-05-10 MED ORDER — FLUTICASONE PROPIONATE 50 MCG/ACT NA SUSP
2.0000 | Freq: Every evening | NASAL | 5 refills | Status: AC
  Filled 2023-05-10: qty 16, 30d supply, fill #0
  Filled 2023-06-07 – 2023-08-13 (×3): qty 16, 30d supply, fill #1

## 2023-05-11 ENCOUNTER — Other Ambulatory Visit: Payer: Self-pay

## 2023-05-13 ENCOUNTER — Other Ambulatory Visit: Payer: Self-pay

## 2023-05-13 ENCOUNTER — Encounter: Payer: Self-pay | Admitting: Critical Care Medicine

## 2023-05-13 ENCOUNTER — Ambulatory Visit: Payer: Medicaid Other | Attending: Critical Care Medicine | Admitting: Critical Care Medicine

## 2023-05-13 ENCOUNTER — Ambulatory Visit: Payer: Self-pay

## 2023-05-13 VITALS — BP 130/82 | HR 103 | Wt 381.4 lb

## 2023-05-13 DIAGNOSIS — J440 Chronic obstructive pulmonary disease with acute lower respiratory infection: Secondary | ICD-10-CM

## 2023-05-13 DIAGNOSIS — Z720 Tobacco use: Secondary | ICD-10-CM | POA: Diagnosis not present

## 2023-05-13 DIAGNOSIS — J0100 Acute maxillary sinusitis, unspecified: Secondary | ICD-10-CM

## 2023-05-13 DIAGNOSIS — J209 Acute bronchitis, unspecified: Secondary | ICD-10-CM | POA: Insufficient documentation

## 2023-05-13 DIAGNOSIS — I10 Essential (primary) hypertension: Secondary | ICD-10-CM | POA: Diagnosis not present

## 2023-05-13 DIAGNOSIS — J019 Acute sinusitis, unspecified: Secondary | ICD-10-CM | POA: Insufficient documentation

## 2023-05-13 MED ORDER — AZITHROMYCIN 250 MG PO TABS
ORAL_TABLET | ORAL | 0 refills | Status: AC
  Filled 2023-05-13: qty 6, 5d supply, fill #0

## 2023-05-13 MED ORDER — BUDESONIDE-FORMOTEROL FUMARATE 160-4.5 MCG/ACT IN AERO
2.0000 | INHALATION_SPRAY | Freq: Two times a day (BID) | RESPIRATORY_TRACT | 3 refills | Status: DC
  Filled 2023-05-13: qty 10.2, 30d supply, fill #0
  Filled 2023-06-07 – 2023-08-13 (×3): qty 10.2, 30d supply, fill #1
  Filled 2023-09-30: qty 10.2, 30d supply, fill #2

## 2023-05-13 MED ORDER — PREDNISONE 10 MG PO TABS
40.0000 mg | ORAL_TABLET | Freq: Every day | ORAL | 0 refills | Status: DC
  Filled 2023-05-13: qty 20, 5d supply, fill #0

## 2023-05-13 NOTE — Assessment & Plan Note (Signed)
Ongoing tobacco use given counseling

## 2023-05-13 NOTE — Progress Notes (Signed)
Acute Office Visit  Subjective:     Patient ID: Judy Hanna, female    DOB: 1986-02-16, 38 y.o.   MRN: [id number]  Chief Complaint  Patient presents with   Cough   Nasal Congestion   Medication Refill    HPI Patient is in today for cough congestion med refills  pcp johnson The patient states she has had nasal drainage cough productive of thick yellow mucus and dyspnea no wheezing for a week.  The patient on arrival also has elevated blood pressure 161/105.  She had a flu vaccine recently at another clinic.  Recheck on blood pressure is now down to 130/82.  She has been compliant with all her other medications.  She does need refills.   Review of Systems  Constitutional:  Negative for chills, diaphoresis, fever, malaise/fatigue and weight loss.  HENT:  Positive for congestion. Negative for hearing loss, nosebleeds, sore throat and tinnitus.   Eyes:  Negative for blurred vision, photophobia and redness.  Respiratory:  Positive for cough, sputum production and shortness of breath. Negative for hemoptysis, wheezing and stridor.   Cardiovascular:  Negative for chest pain, palpitations, orthopnea, claudication, leg swelling and PND.  Gastrointestinal:  Negative for abdominal pain, blood in stool, constipation, diarrhea, heartburn, nausea and vomiting.  Genitourinary:  Negative for dysuria, flank pain, frequency, hematuria and urgency.  Musculoskeletal:  Negative for back pain, falls, joint pain, myalgias and neck pain.  Skin:  Negative for itching and rash.  Neurological:  Negative for dizziness, tingling, tremors, sensory change, speech change, focal weakness, seizures, loss of consciousness, weakness and headaches.  Endo/Heme/Allergies:  Negative for environmental allergies and polydipsia. Does not bruise/bleed easily.  Psychiatric/Behavioral:  Negative for depression, memory loss, substance abuse and suicidal ideas. The patient is not nervous/anxious and does not have insomnia.          Objective:    BP 130/82   Pulse (!) 103   Wt (!) 381 lb 6.4 oz (173 kg)   SpO2 97%   BMI 56.32 kg/m    Physical Exam Vitals reviewed.  Constitutional:      Appearance: Normal appearance. She is well-developed. She is not diaphoretic.  HENT:     Head: Normocephalic and atraumatic.     Nose: Congestion and rhinorrhea present. No nasal deformity, septal deviation or mucosal edema.     Right Sinus: No maxillary sinus tenderness or frontal sinus tenderness.     Left Sinus: No maxillary sinus tenderness or frontal sinus tenderness.     Mouth/Throat:     Mouth: Mucous membranes are moist.     Pharynx: Oropharynx is clear. No oropharyngeal exudate.  Eyes:     General: No scleral icterus.    Conjunctiva/sclera: Conjunctivae normal.     Pupils: Pupils are equal, round, and reactive to light.  Neck:     Thyroid: No thyromegaly.     Vascular: No carotid bruit or JVD.     Trachea: Trachea normal. No tracheal tenderness or tracheal deviation.  Cardiovascular:     Rate and Rhythm: Normal rate and regular rhythm.     Chest Wall: PMI is not displaced.     Pulses: Normal pulses. No decreased pulses.     Heart sounds: Normal heart sounds, S1 normal and S2 normal. Heart sounds not distant. No murmur heard.    No systolic murmur is present.     No diastolic murmur is present.     No friction rub. No gallop. No S3 or S4 sounds.  Pulmonary:     Effort: No tachypnea, accessory muscle usage or respiratory distress.     Breath sounds: No stridor. Wheezing and rhonchi present. No decreased breath sounds or rales.  Chest:     Chest wall: No tenderness.  Abdominal:     General: Bowel sounds are normal. There is no distension.     Palpations: Abdomen is soft. Abdomen is not rigid.     Tenderness: There is no abdominal tenderness. There is no guarding or rebound.  Musculoskeletal:        General: Normal range of motion.     Cervical back: Normal range of motion and neck supple. No  edema, erythema or rigidity. No muscular tenderness. Normal range of motion.  Lymphadenopathy:     Head:     Right side of head: No submental or submandibular adenopathy.     Left side of head: No submental or submandibular adenopathy.     Cervical: No cervical adenopathy.  Skin:    General: Skin is warm and dry.     Coloration: Skin is not pale.     Findings: No rash.     Nails: There is no clubbing.  Neurological:     Mental Status: She is alert and oriented to person, place, and time.     Sensory: No sensory deficit.  Psychiatric:        Speech: Speech normal.        Behavior: Behavior normal.     No results found for any visits on 05/13/23.      Assessment & Plan:   Problem List Items Addressed This Visit       Cardiovascular and Mediastinum   Essential hypertension - Primary   Blood pressure under current good control plan for be for the patient to continue with losartan 25 mg daily low dose        Respiratory   Sinusitis, acute   Acute sinusitis we will give a course of azithromycin and pulse prednisone      Relevant Medications   azithromycin (ZITHROMAX) 250 MG tablet   predniSONE (DELTASONE) 10 MG tablet   Acute tracheobronchitis   Will prescribe Symbicort at varying doses and albuterol as needed      RESOLVED: COPD with acute bronchitis (HCC)   Relevant Medications   azithromycin (ZITHROMAX) 250 MG tablet   predniSONE (DELTASONE) 10 MG tablet   budesonide-formoterol (SYMBICORT) 160-4.5 MCG/ACT inhaler     Other   Tobacco abuse   Ongoing tobacco use given counseling       Meds ordered this encounter  Medications   azithromycin (ZITHROMAX) 250 MG tablet    Sig: Take 2 tablets (500 mg total) by mouth daily for 1 day, THEN 1 tablet (250 mg total) daily for 4 days.    Dispense:  6 tablet    Refill:  0   predniSONE (DELTASONE) 10 MG tablet    Sig: Take 4 tablets (40 mg total) by mouth daily FOR 5 DAYS THEN STOP.    Dispense:  20 tablet     Refill:  0   budesonide-formoterol (SYMBICORT) 160-4.5 MCG/ACT inhaler    Sig: Inhale 2 puffs into the lungs 2 (two) times daily.    Dispense:  10.2 g    Refill:  3   Multiple medications were refilled Return for followup, htn, asthma.  Shan Levans, MD

## 2023-05-13 NOTE — Telephone Encounter (Signed)
Chief Complaint: Cough Symptoms: Productive cough, yellow sputum, chest tightness, sneezing, runny nose, congestion, headache, mild SOB Frequency: Constant  Pertinent Negatives: Patient denies fever, nausea, vomiting, fatigue Disposition: [] ED /[] Urgent Care (no appt availability in office) / [x] Appointment(In office/virtual)/ []  [facility]/ [] Home Care/ [] Refused Recommended Disposition /[] [facility]/ []  Follow-up with PCP Additional Notes: Patient states last week she felt congested but was sneezing a lot with a runny nose so she though it was allergy symptoms. Patient states she started to feel congested with a productive cough and the sputum is yellow in color. Patient also reports chest tightness, headache and mild SOB. Patient has taking tylenol cough OTC medication without much improvement to her symptoms. Care advice was given and patient was scheduled for an OV today at 1010.   Summary: congestion in chest   Pt has chest congestion, thick and cough,  no appts until Feb.  Please advise  [phone].  Asking for antibiotic and prednisone     Reason for Disposition  [1] Continuous (nonstop) coughing interferes with work or school AND [2] no improvement using cough treatment per Care Advice  Answer Assessment - Initial Assessment Questions 1. ONSET: "When did the cough begin?"      1 week ago  2. SEVERITY: "How bad is the cough today?"      Mild to Moderate  3. SPUTUM: "Describe the color of your sputum" (none, dry cough; clear, white, yellow, green)     Yellow  4. HEMOPTYSIS: "Are you coughing up any blood?" If so ask: "How much?" (flecks, streaks, tablespoons, etc.)     No  5. DIFFICULTY BREATHING: "Are you having difficulty breathing?" If Yes, ask: "How bad is it?" (e.g., mild, moderate, severe)    - MILD: No SOB at rest, mild SOB with walking, speaks normally in sentences, can lie down, no retractions, pulse < 100.    - MODERATE: SOB at rest, SOB with  minimal exertion and prefers to sit, cannot lie down flat, speaks in phrases, mild retractions, audible wheezing, pulse 100-120.    - SEVERE: Very SOB at rest, speaks in single words, struggling to breathe, sitting hunched forward, retractions, pulse > 120      Mild  6. FEVER: "Do you have a fever?" If Yes, ask: "What is your temperature, how was it measured, and when did it start?"     97.6 7. CARDIAC HISTORY: "Do you have any history of heart disease?" (e.g., heart attack, congestive heart failure)      Hypertension  8. LUNG HISTORY: "Do you have any history of lung disease?"  (e.g., pulmonary embolus, asthma, emphysema)     Asthma, bronchitis  9. PE RISK FACTORS: "Do you have a history of blood clots?" (or: recent major surgery, recent prolonged travel, bedridden)     No  10. OTHER SYMPTOMS: "Do you have any other symptoms?" (e.g., runny nose, wheezing, chest pain)       Runny nose, sneezing, chest tightness, headache 12. TRAVEL: "Have you traveled out of the country in the last month?" (e.g., travel history, exposures)       No  Protocols used: Cough - Acute Productive-A-AH

## 2023-05-13 NOTE — Assessment & Plan Note (Signed)
Acute sinusitis we will give a course of azithromycin and pulse prednisone

## 2023-05-13 NOTE — Patient Instructions (Signed)
Start symbicort two puff twice daily for 2 weeks then as needed for cough Take prednisone and azithromycin for bronchitis and sinus infection Return 4 weeks for blood pressure recheck and follow up

## 2023-05-13 NOTE — Assessment & Plan Note (Signed)
Will prescribe Symbicort at varying doses and albuterol as needed

## 2023-05-13 NOTE — Assessment & Plan Note (Signed)
Blood pressure under current good control plan for be for the patient to continue with losartan 25 mg daily low dose

## 2023-05-31 ENCOUNTER — Ambulatory Visit (HOSPITAL_COMMUNITY): Payer: Medicaid Other | Admitting: Clinical

## 2023-05-31 ENCOUNTER — Encounter (HOSPITAL_COMMUNITY): Payer: Self-pay

## 2023-06-07 ENCOUNTER — Other Ambulatory Visit: Payer: Self-pay

## 2023-06-07 ENCOUNTER — Other Ambulatory Visit: Payer: Self-pay | Admitting: Internal Medicine

## 2023-06-07 MED ORDER — LOSARTAN POTASSIUM 25 MG PO TABS
25.0000 mg | ORAL_TABLET | Freq: Every day | ORAL | 0 refills | Status: DC
  Filled 2023-06-07 – 2023-09-02 (×3): qty 90, 90d supply, fill #0

## 2023-06-07 MED ORDER — WEGOVY 0.25 MG/0.5ML ~~LOC~~ SOAJ
0.2500 mg | SUBCUTANEOUS | 2 refills | Status: AC
Start: 2023-06-07 — End: ?
  Filled 2023-06-07: qty 2, 28d supply, fill #0

## 2023-06-08 ENCOUNTER — Other Ambulatory Visit: Payer: Self-pay

## 2023-06-09 ENCOUNTER — Other Ambulatory Visit: Payer: Self-pay

## 2023-06-14 ENCOUNTER — Other Ambulatory Visit: Payer: Self-pay

## 2023-06-16 ENCOUNTER — Other Ambulatory Visit: Payer: Self-pay

## 2023-06-17 ENCOUNTER — Other Ambulatory Visit: Payer: Self-pay

## 2023-06-18 ENCOUNTER — Other Ambulatory Visit: Payer: Self-pay

## 2023-06-21 ENCOUNTER — Other Ambulatory Visit: Payer: Self-pay

## 2023-06-21 NOTE — Progress Notes (Unsigned)
 Patient did not connect for virtual psychiatric medication management appointment on 06/23/23 at 2:30PM. Sent secure video link with no response. Called phone and received out of service message and unable to leave VM.  Daine Gip, MD 06/23/23

## 2023-06-23 ENCOUNTER — Encounter (HOSPITAL_COMMUNITY): Payer: Self-pay

## 2023-06-23 ENCOUNTER — Encounter (HOSPITAL_COMMUNITY): Payer: Medicaid Other | Admitting: Psychiatry

## 2023-06-24 ENCOUNTER — Ambulatory Visit (INDEPENDENT_AMBULATORY_CARE_PROVIDER_SITE_OTHER): Payer: Medicaid Other | Admitting: Clinical

## 2023-06-24 DIAGNOSIS — F331 Major depressive disorder, recurrent, moderate: Secondary | ICD-10-CM
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
## 2023-06-25 ENCOUNTER — Other Ambulatory Visit: Payer: Self-pay

## 2023-07-07 ENCOUNTER — Other Ambulatory Visit: Payer: Self-pay

## 2023-07-07 MED ORDER — DOXYCYCLINE HYCLATE 100 MG PO TABS
100.0000 mg | ORAL_TABLET | Freq: Two times a day (BID) | ORAL | 0 refills | Status: DC
Start: 2023-07-07 — End: 2023-08-17
  Filled 2023-07-07: qty 20, 10d supply, fill #0

## 2023-07-08 ENCOUNTER — Other Ambulatory Visit: Payer: Self-pay

## 2023-07-09 ENCOUNTER — Other Ambulatory Visit: Payer: Self-pay

## 2023-07-12 ENCOUNTER — Other Ambulatory Visit: Payer: Self-pay

## 2023-07-12 MED ORDER — PHENTERMINE HCL 37.5 MG PO TABS
37.5000 mg | ORAL_TABLET | Freq: Every morning | ORAL | 2 refills | Status: AC
  Filled 2023-07-12 – 2023-09-02 (×2): qty 30, 30d supply, fill #0
  Filled 2023-10-28: qty 30, 30d supply, fill #1
  Filled 2023-12-20: qty 30, 30d supply, fill #2

## 2023-07-13 ENCOUNTER — Other Ambulatory Visit: Payer: Self-pay

## 2023-07-15 ENCOUNTER — Other Ambulatory Visit: Payer: Self-pay

## 2023-07-16 ENCOUNTER — Other Ambulatory Visit: Payer: Self-pay

## 2023-07-20 ENCOUNTER — Other Ambulatory Visit: Payer: Self-pay

## 2023-07-21 ENCOUNTER — Other Ambulatory Visit: Payer: Self-pay

## 2023-07-22 ENCOUNTER — Other Ambulatory Visit: Payer: Self-pay

## 2023-07-26 ENCOUNTER — Other Ambulatory Visit: Payer: Self-pay | Admitting: Internal Medicine

## 2023-07-26 ENCOUNTER — Other Ambulatory Visit: Payer: Self-pay

## 2023-07-28 ENCOUNTER — Other Ambulatory Visit: Payer: Self-pay

## 2023-07-28 MED ORDER — VITAMIN D (ERGOCALCIFEROL) 50000 UNITS PO CAPS
1.0000 | ORAL_CAPSULE | ORAL | 5 refills | Status: AC
  Filled 2023-07-28 – 2023-09-02 (×3): qty 4, 28d supply, fill #0
  Filled 2023-10-28: qty 4, 28d supply, fill #1
  Filled 2023-12-20: qty 4, 28d supply, fill #2
  Filled 2024-05-01: qty 4, 28d supply, fill #3

## 2023-07-29 ENCOUNTER — Other Ambulatory Visit: Payer: Self-pay

## 2023-08-04 ENCOUNTER — Other Ambulatory Visit: Payer: Self-pay

## 2023-08-05 ENCOUNTER — Other Ambulatory Visit: Payer: Self-pay

## 2023-08-06 NOTE — Progress Notes (Signed)
 Patient did not connect for virtual psychiatric medication management appointment on 08/09/23 at 3:30PM. Sent secure video link with no response. Called phone and patient answered reporting inability to conduct visit as she is at work. Requests to reschedule. She denies any urgent questions or concerns. Reports adherence to Prozac 20 mg daily; reports she has also been taking the "as needed" anxiety medication at night which she believes is prazosin (had been discontinued at last visit due to nonadherence). Counseled on nightly adherence to prazosin and she requests refills of these medications to bridge until next appointment. Rescheduled to next available on 10/13/23 at [facility] by video.  Of note, if patient no shows or has late cancellation for next appointment, she will need to present as walk in per clinic policy.  Virtual Visit via Telephone Note  I connected with Judy Hanna on 08/09/23 at  3:30 PM EDT by telephone and verified that I am speaking with the correct person using two identifiers.  Location: Patient: workplace in [place] Provider: remote office in [place]   I discussed the limitations, risks, security and privacy concerns of performing an evaluation and management service by telephone and the availability of in person appointments. I also discussed with the patient that there may be a patient responsible charge related to this service. The patient expressed understanding and agreed to proceed.   I discussed the assessment and treatment plan with the patient. The patient was provided an opportunity to ask questions and all were answered. The patient agreed with the plan and demonstrated an understanding of the instructions.   The patient was advised to call back or seek an in-person evaluation if the symptoms worsen or if the condition fails to improve as anticipated.  I provided 5 minutes of non-face-to-face time during this encounter.  [name], MD 08/09/23

## 2023-08-09 ENCOUNTER — Telehealth (HOSPITAL_COMMUNITY): Payer: Medicaid Other | Admitting: Psychiatry

## 2023-08-09 ENCOUNTER — Encounter (HOSPITAL_COMMUNITY): Payer: Self-pay

## 2023-08-09 ENCOUNTER — Other Ambulatory Visit: Payer: Self-pay

## 2023-08-09 DIAGNOSIS — F331 Major depressive disorder, recurrent, moderate: Secondary | ICD-10-CM

## 2023-08-09 DIAGNOSIS — F4310 Post-traumatic stress disorder, unspecified: Secondary | ICD-10-CM

## 2023-08-09 MED ORDER — PRAZOSIN HCL 1 MG PO CAPS
1.0000 mg | ORAL_CAPSULE | Freq: Every day | ORAL | 2 refills | Status: DC
  Filled 2023-08-09 (×2): qty 30, 30d supply, fill #0

## 2023-08-09 MED ORDER — FLUOXETINE HCL 20 MG PO CAPS
20.0000 mg | ORAL_CAPSULE | Freq: Every day | ORAL | 2 refills | Status: DC
  Filled 2023-08-09: qty 30, 30d supply, fill #0

## 2023-08-10 ENCOUNTER — Other Ambulatory Visit: Payer: Self-pay

## 2023-08-13 ENCOUNTER — Other Ambulatory Visit: Payer: Self-pay

## 2023-08-16 ENCOUNTER — Other Ambulatory Visit: Payer: Self-pay

## 2023-08-16 ENCOUNTER — Ambulatory Visit: Payer: Self-pay

## 2023-08-16 NOTE — Telephone Encounter (Signed)
 Chief Complaint: SOB Symptoms: Mild SOB, Coughing, wheezing  Frequency: Comes and goes  Pertinent Negatives: Patient denies fever, chest pain, nausea, vomiting Disposition: [] ED /[] Urgent Care (no appt availability in office) / [x] Appointment(In office/virtual)/ []  [facility]/ [] Home Care/ [] Refused Recommended Disposition /[] [facility]/ []  Follow-up with PCP Additional Notes: Patient states she as a history of bronchitis that she gets about once a year. History of asthma and is a current smoker, smoking 1 pack a day. Patient states she is experiencing mild SOB that comes and goes, coughing and wheezing. Care advice given and patient has been scheduled for a virtual visit with PCP tomorrow.   Copied from CRM [id number]. Topic: Clinical - Red Word Triage >> Aug 16, 2023 12:02 PM Everette C wrote: [facility] that prompted transfer to Nurse Triage: The patient is currently experiencing respiratory discomfort and shortness of breath Reason for Disposition  [1] MODERATE longstanding difficulty breathing (e.g., speaks in phrases, SOB even at rest, pulse 100-120) AND [2] SAME as normal  Answer Assessment - Initial Assessment Questions 1. RESPIRATORY STATUS: "Describe your breathing?" (e.g., wheezing, shortness of breath, unable to speak, severe coughing)      SOB 2. ONSET: "When did this breathing problem begin?"      Thursday or Friday  3. PATTERN "Does the difficult breathing come and go, or has it been constant since it started?"      Come and goes  4. SEVERITY: "How bad is your breathing?" (e.g., mild, moderate, severe)    - MILD: No SOB at rest, mild SOB with walking, speaks normally in sentences, can lie down, no retractions, pulse < 100.    - MODERATE: SOB at rest, SOB with minimal exertion and prefers to sit, cannot lie down flat, speaks in phrases, mild retractions, audible wheezing, pulse 100-120.    - SEVERE: Very SOB at rest, speaks in single words, struggling to  breathe, sitting hunched forward, retractions, pulse > 120      Mild  5. RECURRENT SYMPTOM: "Have you had difficulty breathing before?" If Yes, ask: "When was the last time?" and "What happened that time?"      Yes, when I have bronchitis  6. CARDIAC HISTORY: "Do you have any history of heart disease?" (e.g., heart attack, angina, bypass surgery, angioplasty)      N/A  7. LUNG HISTORY: "Do you have any history of lung disease?"  (e.g., pulmonary embolus, asthma, emphysema)     Asthma, smoker 8. CAUSE: "What do you think is causing the breathing problem?"      I usually get bronchitis once a yeah  9. OTHER SYMPTOMS: "Do you have any other symptoms? (e.g., dizziness, runny nose, cough, chest pain, fever)     Coughing, wheezing  10. O2 SATURATION MONITOR:  "Do you use an oxygen saturation monitor (pulse oximeter) at home?" If Yes, ask: "What is your reading (oxygen level) today?" "What is your usual oxygen saturation reading?" (e.g., 95%)       N/A 11. PREGNANCY: "Is there any chance you are pregnant?" "When was your last menstrual period?"       N/A 12. TRAVEL: "Have you traveled out of the country in the last month?" (e.g., travel history, exposures)       N/A  Protocols used: Breathing Difficulty-A-AH

## 2023-08-16 NOTE — Telephone Encounter (Signed)
 Noted.

## 2023-08-17 ENCOUNTER — Telehealth (HOSPITAL_BASED_OUTPATIENT_CLINIC_OR_DEPARTMENT_OTHER): Payer: Self-pay | Admitting: Internal Medicine

## 2023-08-17 ENCOUNTER — Other Ambulatory Visit: Payer: Self-pay

## 2023-08-17 ENCOUNTER — Encounter: Payer: Self-pay | Admitting: Internal Medicine

## 2023-08-17 DIAGNOSIS — J40 Bronchitis, not specified as acute or chronic: Secondary | ICD-10-CM

## 2023-08-17 DIAGNOSIS — F17200 Nicotine dependence, unspecified, uncomplicated: Secondary | ICD-10-CM

## 2023-08-17 DIAGNOSIS — J4521 Mild intermittent asthma with (acute) exacerbation: Secondary | ICD-10-CM

## 2023-08-17 DIAGNOSIS — J302 Other seasonal allergic rhinitis: Secondary | ICD-10-CM | POA: Diagnosis not present

## 2023-08-17 DIAGNOSIS — F17210 Nicotine dependence, cigarettes, uncomplicated: Secondary | ICD-10-CM | POA: Diagnosis not present

## 2023-08-17 MED ORDER — ALBUTEROL SULFATE HFA 108 (90 BASE) MCG/ACT IN AERS
1.0000 | INHALATION_SPRAY | RESPIRATORY_TRACT | 3 refills | Status: AC | PRN
  Filled 2023-08-17: qty 18, 17d supply, fill #0

## 2023-08-17 MED ORDER — PREDNISONE 20 MG PO TABS
ORAL_TABLET | ORAL | 0 refills | Status: AC
  Filled 2023-08-17: qty 5, 7d supply, fill #0

## 2023-08-17 MED ORDER — NICOTINE 21 MG/24HR TD PT24
21.0000 mg | MEDICATED_PATCH | Freq: Every day | TRANSDERMAL | 1 refills | Status: DC
  Filled 2023-08-17: qty 28, 28d supply, fill #0

## 2023-08-17 MED ORDER — ALBUTEROL SULFATE (2.5 MG/3ML) 0.083% IN NEBU
2.5000 mg | INHALATION_SOLUTION | Freq: Four times a day (QID) | RESPIRATORY_TRACT | 1 refills | Status: AC | PRN
  Filled 2023-08-17: qty 150, 13d supply, fill #0

## 2023-08-17 NOTE — Progress Notes (Signed)
 Patient ID: Judy Hanna, female   DOB: 11-03-1985, 38 y.o.   MRN: [id number] Virtual Visit via Video Note  I connected with Judy Hanna on 08/17/2023 at 1:28 PM by a video enabled telemedicine application and verified that I am speaking with the correct person using two identifiers.  Location: Patient: home Provider: Office   I discussed the limitations of evaluation and management by telemedicine and the availability of in person appointments. The patient expressed understanding and agreed to proceed.  History of Present Illness: Patient with history of tob dep, OSA on CPAP, obesity, fibroids, menstrual migraines, recurrent boils genital area, MDD/GAD.   Discussed the use of AI scribe software for clinical note transcription with the patient, who gave verbal consent to proceed.  History of Present Illness   The patient, with a history of bronchitis and smoking, presents with a week-long history of a deep cough, wheezing, and shortness of breath. She describes the cough as deep and productive at times, causing soreness in her lungs. She denies fever, runny nose, and congestion. She has been using her Symbicort  inhaler more frequently than usual to manage the wheezing. Prior to one week ago, she used Symbicort  occasionally; only when she has flare up.  She also reports a history of severe allergies, for which she takes daily cetirizine  and Flonase  nasal spray. She has not been around anyone who has been sick. She has been smoking half to one pack of cigarettes per day but has expressed a desire to quit. Was offered nicotine  patches in past but never filled or use rxn.  Now wanting to try the patches.       Outpatient Encounter Medications as of 08/17/2023  Medication Sig   acetaminophen  (TYLENOL ) 500 MG tablet Take 2 tablets (1,000 mg total) by mouth every 6 (six) hours as needed for fever or headache. (Patient not taking: Reported on 04/05/2023)   albuterol  (VENTOLIN  HFA) 108 (90 Base) MCG/ACT  inhaler Inhale 1-2 puffs into the lungs every 6 (six) hours as needed for wheezing or shortness of breath.   ascorbic acid  (VITAMIN C ) 500 MG tablet Take 1 tablet (500 mg total) by mouth daily. (Patient not taking: Reported on 04/05/2023)   budesonide -formoterol  (SYMBICORT ) 160-4.5 MCG/ACT inhaler Inhale 2 puffs into the lungs 2 (two) times daily.   cetirizine  (ZYRTEC ) 10 MG tablet Take 1 tablet (10 mg total) by mouth every evening as needed for allergies.   Cholecalciferol  (VITAMIN D3) 50 MCG (2000 UT) CAPS Take 1 capsule (2,000 Units total) by mouth in the morning.   doxycycline  (VIBRA -TABS) 100 MG tablet Take 1 tablet (100 mg total) by mouth 2 (two) times daily.   FLUoxetine  (PROZAC ) 20 MG capsule Take 1 capsule (20 mg total) by mouth daily.   fluticasone  (FLONASE ) 50 MCG/ACT nasal spray Place 2 sprays into both nostrils every evening.   furosemide  (LASIX ) 40 MG tablet Take 1 tablet (40 mg total) by mouth daily as needed for leg swelling (Patient not taking: Reported on 04/05/2023)   losartan  (COZAAR ) 25 MG tablet Take 1 tablet (25 mg total) by mouth daily.   metFORMIN  (GLUCOPHAGE ) 500 MG tablet Take 1 tablet (500 mg total) by mouth daily. (Patient not taking: Reported on 04/05/2023)   Multiple Vitamins-Minerals (MULTIVITAMIN WITH MINERALS) tablet Take 1 tablet by mouth 3 (three) times a week. (Patient not taking: Reported on 04/05/2023)   mupirocin  ointment (BACTROBAN ) 2 % Apply 1 Application topically 2 (two) times daily for 1 week as needed. (Patient not taking: Reported on  04/05/2023)   nicotine  (NICODERM CQ  - DOSED IN MG/24 HOURS) 21 mg/24hr patch Place 1 patch (21 mg total) onto the skin daily. (Patient not taking: Reported on 04/05/2023)   phentermine  (ADIPEX-P ) 37.5 MG tablet Take 1 tablet (37.5 mg total) by mouth in the morning.   phentermine  (ADIPEX-P ) 37.5 MG tablet Take 1 tablet (37.5 mg total) by mouth every morning.   prazosin  (MINIPRESS ) 1 MG capsule Take 1 capsule (1 mg total) by mouth  at bedtime.   predniSONE  (DELTASONE ) 10 MG tablet Take 4 tablets (40 mg total) by mouth daily FOR 5 DAYS THEN STOP.   Semaglutide -Weight Management (WEGOVY ) 0.25 MG/0.5ML SOAJ Inject 0.25 mg into the skin once a week.   simethicone  (MYLICON) 80 MG chewable tablet Chew 1 tablet (80 mg total) by mouth 4 (four) times daily as needed for flatulence. (Patient not taking: Reported on 04/05/2023)   SUMAtriptan  (IMITREX ) 50 MG tablet Take 1 tablet at the start of the headache.  May repeat in 2 hours if headache does not resolve.  Max 2 hours / 24-hour. (Patient not taking: Reported on 04/05/2023)   Triclosan  0.3 % LIQD Apply liquid topically daily. Use in the shower, follow with clindamycin  gel after showering before applying lotion (Patient not taking: Reported on 04/05/2023)   Vitamin D , Ergocalciferol , 50000 units CAPS Take 50,000 Units by mouth once a week.   zinc gluconate 50 MG tablet Take 50 mg by mouth daily. (Patient not taking: Reported on 04/05/2023)   No facility-administered encounter medications on file as of 08/17/2023.      Observations/Objective: Young to middle age AAF in NAD  Assessment and Plan: 1. Mild intermittent asthma with acute exacerbation (Primary) Strongly is to quit smoking. Refill sent on albuterol  inhaler and prescription for albuterol  nebulizer solution.  Continue Symbicort  until current symptoms resolved. Will give a short course of prednisone  taper. - albuterol  (VENTOLIN  HFA) 108 (90 Base) MCG/ACT inhaler; Inhale 1-2 puffs into the lungs every 4 (four) hours as needed for wheezing or shortness of breath.  Dispense: 8 g; Refill: 3 - predniSONE  (DELTASONE ) 20 MG tablet; 1 tab PO daily x 3 days then 1/2 tab PO daily x 4 days  Dispense: 5 tablet; Refill: 0 - albuterol  (PROVENTIL ) (2.5 MG/3ML) 0.083% nebulizer solution; Take 3 mLs (2.5 mg total) by nebulization every 6 (six) hours as needed for wheezing or shortness of breath.  Dispense: 150 mL; Refill: 1  2. Tobacco  dependence Strongly advised to quit.  Discussed health risks associated with smoking.  She is willing to try the nicotine  patches.  Went over stepdown approach with her.  Will start with the 21 mg patch.  Counseling took about 1 minute. - nicotine  (NICODERM CQ  - DOSED IN MG/24 HOURS) 21 mg/24hr patch; Place 1 patch (21 mg total) onto the skin daily.  Dispense: 28 patch; Refill: 1  3. Seasonal allergies Continue Zyrtec  and Flonase  nasal spray.   Follow Up Instructions: 2 mths for chronic ds management.   I discussed the assessment and treatment plan with the patient. The patient was provided an opportunity to ask questions and all were answered. The patient agreed with the plan and demonstrated an understanding of the instructions.   The patient was advised to call back or seek an in-person evaluation if the symptoms worsen or if the condition fails to improve as anticipated.  I spent 15 minutes dedicated to the care of this patient on the date of this encounter to include previsit review of of my  last note and Dr. Marne Sings note, face-to-face time with patient discussing diagnosis and management and post visit entering of orders.  This note has been created with Education officer, environmental. Any transcriptional errors are unintentional.  Concetta Dee, MD

## 2023-08-23 ENCOUNTER — Other Ambulatory Visit: Payer: Self-pay

## 2023-08-30 ENCOUNTER — Other Ambulatory Visit: Payer: Self-pay

## 2023-09-01 ENCOUNTER — Other Ambulatory Visit: Payer: Self-pay

## 2023-09-02 ENCOUNTER — Other Ambulatory Visit: Payer: Self-pay

## 2023-09-03 ENCOUNTER — Other Ambulatory Visit: Payer: Self-pay

## 2023-09-28 ENCOUNTER — Other Ambulatory Visit: Payer: Self-pay

## 2023-09-28 ENCOUNTER — Emergency Department (HOSPITAL_BASED_OUTPATIENT_CLINIC_OR_DEPARTMENT_OTHER)
Admission: EM | Admit: 2023-09-28 | Discharge: 2023-09-28 | Disposition: A | Discharge status: Home / Self Care | Attending: Emergency Medicine | Admitting: Emergency Medicine

## 2023-09-28 ENCOUNTER — Encounter (HOSPITAL_BASED_OUTPATIENT_CLINIC_OR_DEPARTMENT_OTHER): Payer: Self-pay

## 2023-09-28 ENCOUNTER — Emergency Department (HOSPITAL_BASED_OUTPATIENT_CLINIC_OR_DEPARTMENT_OTHER): Admitting: Radiology

## 2023-09-28 DIAGNOSIS — Z79899 Other long term (current) drug therapy: Secondary | ICD-10-CM | POA: Insufficient documentation

## 2023-09-28 DIAGNOSIS — J209 Acute bronchitis, unspecified: Secondary | ICD-10-CM

## 2023-09-28 DIAGNOSIS — R059 Cough, unspecified: Secondary | ICD-10-CM | POA: Diagnosis present

## 2023-09-28 DIAGNOSIS — I10 Essential (primary) hypertension: Secondary | ICD-10-CM | POA: Insufficient documentation

## 2023-09-28 LAB — RESP PANEL BY RT-PCR (RSV, FLU A&B, COVID)  RVPGX2
Influenza A by PCR: NEGATIVE
Influenza B by PCR: NEGATIVE
Resp Syncytial Virus by PCR: NEGATIVE
SARS Coronavirus 2 by RT PCR: NEGATIVE

## 2023-09-28 MED ORDER — PREDNISONE 20 MG PO TABS
40.0000 mg | ORAL_TABLET | Freq: Once | ORAL | Status: AC
  Administered 2023-09-28: 40 mg via ORAL
  Filled 2023-09-28: qty 2

## 2023-09-28 MED ORDER — AZITHROMYCIN 250 MG PO TABS: 250.0000 mg | ORAL_TABLET | Freq: Every day | ORAL | 0 refills | Status: DC

## 2023-09-28 MED ORDER — SODIUM CHLORIDE 0.9 % IV SOLN
500.0000 mg | Freq: Once | INTRAVENOUS | Status: AC
  Administered 2023-09-28: 500 mg via INTRAVENOUS
  Filled 2023-09-28: qty 5

## 2023-09-28 MED ORDER — PREDNISONE 20 MG PO TABS
40.0000 mg | ORAL_TABLET | Freq: Every day | ORAL | 0 refills | Status: DC
  Filled 2023-09-30: qty 10, 5d supply, fill #0

## 2023-09-28 NOTE — ED Provider Notes (Signed)
 [place] EMERGENCY DEPARTMENT AT [facility] Provider Note   CSN: [id number] Arrival date & time: 09/28/23  2009     History  Chief Complaint  Patient presents with   Nasal Congestion   Diarrhea    Judy Hanna is a 38 y.o. female with past medical history significant for obesity, tobacco abuse, hypertension, who presents with concern for diarrhea, shortness of breath, cough, congestion.  Reports some positive sick contacts.  Some improvement with inhaler.   Diarrhea      Home Medications Prior to Admission medications   Medication Sig Start Date End Date Taking? Authorizing Provider  albuterol  (PROVENTIL ) (2.5 MG/3ML) 0.083% nebulizer solution Take 3 mLs (2.5 mg total) by nebulization every 6 (six) hours as needed for wheezing or shortness of breath. 08/17/23   Lawrance Presume, MD  albuterol  (VENTOLIN  HFA) 108 [phone] Base) MCG/ACT inhaler Inhale 1-2 puffs into the lungs every 4 (four) hours as needed for wheezing or shortness of breath. 08/17/23   Lawrance Presume, MD  ascorbic acid  (VITAMIN C ) 500 MG tablet Take 1 tablet (500 mg total) by mouth daily. Patient not taking: Reported on 04/05/2023 12/11/21   Vernell Goldsmith, MD  budesonide -formoterol  (SYMBICORT ) 160-4.5 MCG/ACT inhaler Inhale 2 puffs into the lungs 2 (two) times daily. 05/13/23   Vernell Goldsmith, MD  cetirizine  (ZYRTEC ) 10 MG tablet Take 1 tablet (10 mg total) by mouth every evening as needed for allergies. 05/10/23     Cholecalciferol  (VITAMIN D3) 50 MCG (2000 UT) CAPS Take 1 capsule (2,000 Units total) by mouth in the morning. 04/26/23   Bahraini, Sarah A  FLUoxetine  (PROZAC ) 20 MG capsule Take 1 capsule (20 mg total) by mouth daily. 08/09/23   Bahraini, Sarah A  fluticasone  (FLONASE ) 50 MCG/ACT nasal spray Place 2 sprays into both nostrils every evening. 05/10/23     furosemide  (LASIX ) 40 MG tablet Take 1 tablet (40 mg total) by mouth daily as needed for leg swelling Patient not taking: Reported on  04/05/2023 12/17/22     losartan  (COZAAR ) 25 MG tablet Take 1 tablet (25 mg total) by mouth daily. 06/07/23   Lawrance Presume, MD  metFORMIN  (GLUCOPHAGE ) 500 MG tablet Take 1 tablet (500 mg total) by mouth daily. Patient not taking: Reported on 04/05/2023 02/17/23     Multiple Vitamins-Minerals (MULTIVITAMIN WITH MINERALS) tablet Take 1 tablet by mouth 3 (three) times a week. Patient not taking: Reported on 04/05/2023    [provider]  nicotine  (NICODERM CQ  - DOSED IN MG/24 HOURS) 21 mg/24hr patch Place 1 patch (21 mg total) onto the skin daily. 08/17/23   Lawrance Presume, MD  phentermine  (ADIPEX-P ) 37.5 MG tablet Take 1 tablet (37.5 mg total) by mouth in the morning. 04/30/23   Charle Congo, MD  phentermine  (ADIPEX-P ) 37.5 MG tablet Take 1 tablet (37.5 mg total) by mouth every morning. 07/07/23   Charle Congo, MD  prazosin  (MINIPRESS ) 1 MG capsule Take 1 capsule (1 mg total) by mouth at bedtime. 08/09/23   Bahraini, Sarah A  Semaglutide -Weight Management (WEGOVY ) 0.25 MG/0.5ML SOAJ Inject 0.25 mg into the skin once a week. 06/07/23     simethicone  (MYLICON) 80 MG chewable tablet Chew 1 tablet (80 mg total) by mouth 4 (four) times daily as needed for flatulence. Patient not taking: Reported on 04/05/2023 06/20/21   Abigail Abler, MD  SUMAtriptan  (IMITREX ) 50 MG tablet Take 1 tablet at the start of the headache.  May repeat in 2 hours if headache  does not resolve.  Max 2 hours / 24-hour. Patient not taking: Reported on 04/05/2023 01/04/23   Lawrance Presume, MD  Triclosan  0.3 % LIQD Apply liquid topically daily. Use in the shower, follow with clindamycin  gel after showering before applying lotion Patient not taking: Reported on 04/05/2023 12/10/21   Derick Fleeting, CNM  Vitamin D , Ergocalciferol , 50000 units CAPS Take 1 capsule by mouth once a week. 07/28/23     zinc gluconate 50 MG tablet Take 50 mg by mouth daily. Patient not taking: Reported on 04/05/2023    [provider]       Allergies    Cherry    Review of Systems   Review of Systems  Gastrointestinal:  Positive for diarrhea.  All other systems reviewed and are negative.   Physical Exam Updated Vital Signs BP (!) 147/107 (BP Location: Right Arm)   Pulse 90   Temp 98.7 F (37.1 C)   Resp 20   Ht 5\' 9"  (1.753 m)   Wt (!) 149.7 kg   LMP 09/22/2023   SpO2 100%   BMI 48.73 kg/m  Physical Exam Vitals and nursing note reviewed.  Constitutional:      General: She is not in acute distress.    Appearance: Normal appearance.  HENT:     Head: Normocephalic and atraumatic.  Eyes:     General:        Right eye: No discharge.        Left eye: No discharge.  Cardiovascular:     Rate and Rhythm: Normal rate and regular rhythm.     Heart sounds: No murmur heard.    No friction rub. No gallop.  Pulmonary:     Effort: Pulmonary effort is normal.     Breath sounds: Normal breath sounds.     Comments: Some mild rhonchi throughout lower lung fields, no significant wheezing.  No acute resp distress. Abdominal:     General: Bowel sounds are normal.     Palpations: Abdomen is soft.  Skin:    General: Skin is warm and dry.     Capillary Refill: Capillary refill takes less than 2 seconds.  Neurological:     Mental Status: She is alert and oriented to person, place, and time.  Psychiatric:        Mood and Affect: Mood normal.        Behavior: Behavior normal.     ED Results / Procedures / Treatments   Labs (all labs ordered are listed, but only abnormal results are displayed) Labs Reviewed  RESP PANEL BY RT-PCR (RSV, FLU A&B, COVID)  [id number]    EKG None  Radiology DG Chest 2 View Result Date: 09/28/2023 CLINICAL DATA:  Shortness of breath EXAM: CHEST - 2 VIEW COMPARISON:  chest x-ray 10/26/2022 FINDINGS: The heart size and mediastinal contours are within normal limits. Both lungs are clear. The visualized skeletal structures are unremarkable. IMPRESSION: No active cardiopulmonary disease.  Electronically Signed   By: Tyron Gallon M.D.   On: 09/28/2023 21:05    Procedures Procedures    Medications Ordered in ED Medications  predniSONE  (DELTASONE ) tablet 40 mg (has no administration in time range)  azithromycin  (ZITHROMAX ) 500 mg in sodium chloride  0.9 % 250 mL IVPB (has no administration in time range)    ED Course/ Medical Decision Making/ A&P  Medical Decision Making Amount and/or Complexity of Data Reviewed Radiology: ordered.   This patient is a 38 y.o. female who presents to the ED for concern of cough, congestion, shob.   Differential diagnoses prior to evaluation: asthma exacerbation, COPD exacerbation, acute upper respiratory infection, acute bronchitis, chronic bronchitis, interstitial lung disease, ARDS, PE, pneumonia, atypical ACS, carbon monoxide poisoning, spontaneous pneumothorax, new CHF vs CHF exacerbation, versus other  Past Medical History / Social History / Additional history: Chart reviewed. Pertinent results include: Obesity, hypertension, tobacco abuse  Physical Exam: Physical exam performed. The pertinent findings include: Some coarse rhonchi, very mild expiratory wheeze, no acute respiratory distress.  Vital signs stable other than some hypertension, blood pressure 147/107 the emergency department.  I independently interpreted plain film radiograph of the chest which shows no evidence of acute intrathoracic abnormality, RVP which is negative for COVID, flu, RSV.  Medications / Treatment: Patient symptoms appear consistent with acute bronchitis on chronic bronchitis, plan to treat with azithromycin , steroid burst, and encourage close PCP follow-up.   Disposition: After consideration of the diagnostic results and the patients response to treatment, I feel that patient stable for discharge with plan as above.   emergency department workup does not suggest an emergent condition requiring admission or immediate  intervention beyond what has been performed at this time. The plan is: as above. The patient is safe for discharge and has been instructed to return immediately for worsening symptoms, change in symptoms or any other concerns.  Final Clinical Impression(s) / ED Diagnoses Final diagnoses:  Acute bronchitis, unspecified organism    Rx / DC Orders ED Discharge Orders     None         Stefan Edge 09/28/23 2213    Trish Furl, MD 09/30/23 [phone]

## 2023-09-28 NOTE — ED Triage Notes (Signed)
 On Sunday pt started having diarrhea and that evening she had an elevated temp of 99.1 and nasal congestion, productive cough, ShOB. Positive sick contacts. Pt has taken Tylenol  Cod and Flu and Mucinex  with no relief.

## 2023-09-28 NOTE — Discharge Instructions (Signed)
 Continue to use your home inhaler as needed, take the entire course of steroids as well as the entire course of antibiotics.  Please follow-up with your primary care doctor to ensure resolution of symptoms, otherwise return to the emergency department if your symptoms significantly worsen despite treatment.

## 2023-09-29 ENCOUNTER — Other Ambulatory Visit: Payer: Self-pay

## 2023-09-29 MED ORDER — AZITHROMYCIN 250 MG PO TABS
ORAL_TABLET | ORAL | 0 refills | Status: AC
  Filled 2023-09-29 – 2023-09-30 (×2): qty 6, 5d supply, fill #0

## 2023-09-29 MED ORDER — NOREL AD 4-10-325 MG PO TABS
1.0000 | ORAL_TABLET | Freq: Two times a day (BID) | ORAL | 2 refills | Status: AC | PRN
  Filled 2023-09-29 – 2023-12-28 (×4): qty 20, 10d supply, fill #0

## 2023-09-30 ENCOUNTER — Other Ambulatory Visit (HOSPITAL_COMMUNITY): Payer: Self-pay

## 2023-09-30 ENCOUNTER — Ambulatory Visit: Payer: Medicaid Other | Admitting: Dermatology

## 2023-09-30 ENCOUNTER — Other Ambulatory Visit: Payer: Self-pay

## 2023-09-30 ENCOUNTER — Encounter: Payer: Self-pay | Admitting: Dermatology

## 2023-09-30 ENCOUNTER — Encounter: Payer: Self-pay | Admitting: Pharmacist

## 2023-09-30 VITALS — BP 116/79

## 2023-09-30 DIAGNOSIS — L810 Postinflammatory hyperpigmentation: Secondary | ICD-10-CM

## 2023-09-30 DIAGNOSIS — L709 Acne, unspecified: Secondary | ICD-10-CM

## 2023-09-30 DIAGNOSIS — L700 Acne vulgaris: Secondary | ICD-10-CM

## 2023-09-30 DIAGNOSIS — L819 Disorder of pigmentation, unspecified: Secondary | ICD-10-CM

## 2023-09-30 MED ORDER — SPIRONOLACTONE 100 MG PO TABS
100.0000 mg | ORAL_TABLET | Freq: Every day | ORAL | 3 refills | Status: AC
Start: 2023-09-30 — End: ?
  Filled 2023-09-30: qty 30, 30d supply, fill #0

## 2023-09-30 MED ORDER — CLINDAMYCIN PHOSPHATE 1 % EX SWAB
1.0000 | Freq: Every morning | CUTANEOUS | 3 refills | Status: DC
  Filled 2023-09-30: qty 60, 30d supply, fill #0

## 2023-09-30 MED ORDER — TRETINOIN 0.025 % EX CREA
TOPICAL_CREAM | Freq: Every day | CUTANEOUS | 3 refills | Status: AC
Start: 2023-09-30 — End: 2024-09-29
  Filled 2023-09-30: qty 45, 30d supply, fill #0

## 2023-09-30 NOTE — Patient Instructions (Addendum)
 Date: Thu Sep 30 2023  Hello Judy Hanna,  Thank you for visiting today. Here is a summary of the key instructions:  Medications: - Take spironolactone 100 mg once a day with dinner - Do not take furosemide  (Lasix ) with spironolactone - Use clindamycin  swabs  on face in the morning after washing - Apply tretinoin at night:   - Start with 2 nights a week for the first month   - If no irritation, increase to 3 nights a week (Monday, Wednesday, Friday)   - Use one pea-sized amount for whole face  Skincare Routine: - Morning:   - Wash face   - Apply clindamycin  swab   - Apply Eucerin  Radiant Tone Dual Active Serum   - Apply Eucerin Radiant Tone sunscreen  - Night:   - Wash face   - Apply Eucerin Radiant Tone Dual Active Serum   - Apply tretinoin (on scheduled nights)   - Apply Eucerin Radiant Tone Nighttime Cream  Lifestyle Changes: - Drink enough water daily, especially when taking spironolactone - Use sunscreen daily to prevent dark spots from getting darker  Follow-up: - Return for follow-up appointment in 4 months  Please reach out if you have any questions or concerns.  Warm regards,  Dr. Louana Roup [facility]at[facility]      Important Information  Due to recent changes in healthcare laws, you may see results of your pathology and/or laboratory studies on MyChart before the doctors have had a chance to review them. We understand that in some cases there may be results that are confusing or concerning to you. Please understand that not all results are received at the same time and often the doctors may need to interpret multiple results in order to provide you with the best plan of care or course of treatment. Therefore, we ask that you please give us  2 business days to thoroughly review all your results before contacting the office for clarification. Should we see a critical lab result, you will be contacted sooner.   If You Need Anything After Your Visit  If you have any  questions or concerns for your doctor, please call our main line at [phone] If no one answers, please leave a voicemail as directed and we will return your call as soon as possible. Messages left after 4 pm will be answered the following business day.   You may also send us  a message via MyChart. We typically respond to MyChart messages within 1-2 business days.  For prescription refills, please ask your pharmacy to contact our office. Our fax number is [phone].  If you have an urgent issue when the clinic is closed that cannot wait until the next business day, you can page your doctor at the number below.    Please note that while we do our best to be available for urgent issues outside of office hours, we are not available 24/7.   If you have an urgent issue and are unable to reach us , you may choose to seek medical care at your doctor's office, retail clinic, urgent care center, or emergency room.  If you have a medical emergency, please immediately call 911 or go to the emergency department. In the event of inclement weather, please call our main line at [phone] for an update on the status of any delays or closures.  Dermatology Medication Tips: Please keep the boxes that topical medications come in in order to help keep track of the instructions about where and how to use these.  Pharmacies typically print the medication instructions only on the boxes and not directly on the medication tubes.   If your medication is too expensive, please contact our office at [phone] or send us  a message through MyChart.   We are unable to tell what your co-pay for medications will be in advance as this is different depending on your insurance coverage. However, we may be able to find a substitute medication at lower cost or fill out paperwork to get insurance to cover a needed medication.   If a prior authorization is required to get your medication covered by your insurance company,  please allow us  1-2 business days to complete this process.  Drug prices often vary depending on where the prescription is filled and some pharmacies may offer cheaper prices.  The website www.goodrx.com contains coupons for medications through different pharmacies. The prices here do not account for what the cost may be with help from insurance (it may be cheaper with your insurance), but the website can give you the price if you did not use any insurance.  - You can print the associated coupon and take it with your prescription to the pharmacy.  - You may also stop by our office during regular business hours and pick up a GoodRx coupon card.  - If you need your prescription sent electronically to a different pharmacy, notify our office through [facility] or by phone at [phone]

## 2023-09-30 NOTE — Progress Notes (Signed)
   New Patient Visit   Subjective  Judy Hanna is a 38 y.o. female who presents for the following: Acne Vulgaris of face and back. She also has black marks on her face that she says come up even when there is no bump. She saw a dermatologist years ago in [place]  but she does not remember what she has used. She has tried a lot of OTC washes.    The following portions of the chart were reviewed this encounter and updated as appropriate: medications, allergies, medical history  Review of Systems:  No other skin or systemic complaints except as noted in HPI or Assessment and Plan.  Objective  Well appearing patient in no apparent distress; mood and affect are within normal limits.  Areas Examined: Face, chest and back  Relevant exam findings are noted in the Assessment and Plan.   Assessment & Plan   1. Acne - Assessment:  Patient presents with inflammatory acne, evidenced by visible bumps and signs of picking. Possibility of hormonal influence, as polycystic ovarian syndrome (PCOS) was considered, though not confirmed. Previous treatments included both oral and topical medications, but specific details were not recalled by the patient.  - Plan:    Initiate spironolactone 100 mg PO daily with dinner     - Informed patient of potential side effects including muscle cramps, lightheadedness, or dizziness if not adequately hydrated     - Advised against concurrent use with furosemide  (Lasix )    Start clindamycin  swabs  topically in the morning after face washing    Begin tretinoin cream at night     - Start with application 2 nights per week for the first month     - If no irritation, increase to 3 nights per week (Monday, Wednesday, Friday)     - Apply pea-sized amount for whole face    Recommend gentle face wash and moisturizers  2. Hyperpigmentation - Assessment:  Patient presents with dark spots, likely post-inflammatory hyperpigmentation secondary to acne.  - Plan:    Start  Eucerin  Radiant Tone regimen:     - Apply Radiantone Serum in the morning     - Follow with Radiantone sunscreen    Provide samples of nighttime serum for optional use as nighttime moisturizer    Educate on importance of sun protection to prevent darkening of hyperpigmented areas    Advised on product availability ([employer], [place], possibly [facility] and [facility])         No follow-ups on file.  I, Eliot Guernsey, CMA, am acting as scribe for [employer], DO .   Documentation: I have reviewed the above documentation for accuracy and completeness, and I agree with the above.  Louana Roup, DO

## 2023-10-01 ENCOUNTER — Other Ambulatory Visit: Payer: Self-pay

## 2023-10-11 ENCOUNTER — Other Ambulatory Visit: Payer: Self-pay
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
## 2023-10-12 ENCOUNTER — Other Ambulatory Visit: Payer: Self-pay

## 2023-10-13 ENCOUNTER — Encounter (HOSPITAL_COMMUNITY): Payer: Self-pay | Admitting: Psychiatry

## 2023-10-13 ENCOUNTER — Other Ambulatory Visit: Payer: Self-pay

## 2023-10-13 ENCOUNTER — Telehealth (INDEPENDENT_AMBULATORY_CARE_PROVIDER_SITE_OTHER): Admitting: Psychiatry

## 2023-10-13 DIAGNOSIS — G4733 Obstructive sleep apnea (adult) (pediatric): Secondary | ICD-10-CM

## 2023-10-13 DIAGNOSIS — F331 Major depressive disorder, recurrent, moderate: Secondary | ICD-10-CM

## 2023-10-13 DIAGNOSIS — F4310 Post-traumatic stress disorder, unspecified: Secondary | ICD-10-CM

## 2023-10-13 DIAGNOSIS — F419 Anxiety disorder, unspecified: Secondary | ICD-10-CM | POA: Diagnosis not present

## 2023-10-13 MED ORDER — FLUOXETINE HCL 40 MG PO CAPS
40.0000 mg | ORAL_CAPSULE | Freq: Every day | ORAL | 2 refills | Status: DC
  Filled 2023-10-13: qty 30, 30d supply, fill #0

## 2023-10-13 NOTE — Patient Instructions (Signed)
 Thank you for attending your appointment today.  -- INCREASE Prozac to 40 mg daily -- Continue other medications as prescribed.  Please do not make any changes to medications without first discussing with your provider. If you are experiencing a psychiatric emergency, please call 911 or present to your nearest emergency department. Additional crisis, medication management, and therapy resources are included below.  [facility]  [address], [place], [place] [address] [phone] WALK-IN URGENT CARE 24/7 FOR ANYONE [address], [place], [place]  [phone] Fax: [phone] [email or web address] *Interpreters available *Accepts all insurance and uninsured for Urgent Care needs *Accepts Medicaid and uninsured for outpatient treatment (below)      ONLY FOR [facility]  Below:    Outpatient New Patient Assessment/Therapy Walk-ins:        Monday, Wednesday, and Thursday 8am until slots are full (first come, first served)                   New Patient Psychiatry/Medication Management        Monday-Friday 8am-11am (first come, first served)               For all walk-ins we ask that you arrive by 7:15am, because patients will be seen in the order of arrival.

## 2023-10-19 ENCOUNTER — Other Ambulatory Visit: Payer: Self-pay

## 2023-10-22 ENCOUNTER — Other Ambulatory Visit: Payer: Self-pay

## 2023-10-28 ENCOUNTER — Other Ambulatory Visit: Payer: Self-pay

## 2023-11-02 ENCOUNTER — Other Ambulatory Visit (HOSPITAL_COMMUNITY)
Admission: RE | Admit: 2023-11-02 | Discharge: 2023-11-02 | Disposition: A | Discharge status: Home / Self Care | Payer: Self-pay | Source: Ambulatory Visit | Attending: Physician Assistant | Admitting: Physician Assistant

## 2023-11-02 ENCOUNTER — Ambulatory Visit: Payer: Self-pay | Admitting: Physician Assistant

## 2023-11-02 ENCOUNTER — Telehealth: Payer: Self-pay | Admitting: Internal Medicine

## 2023-11-02 ENCOUNTER — Other Ambulatory Visit: Payer: Self-pay

## 2023-11-02 ENCOUNTER — Encounter: Payer: Self-pay | Admitting: Physician Assistant

## 2023-11-02 VITALS — BP 136/92 | HR 116 | Ht 69.0 in | Wt 370.0 lb

## 2023-11-02 DIAGNOSIS — F4310 Post-traumatic stress disorder, unspecified: Secondary | ICD-10-CM

## 2023-11-02 DIAGNOSIS — Z716 Tobacco abuse counseling: Secondary | ICD-10-CM

## 2023-11-02 DIAGNOSIS — R000 Tachycardia, unspecified: Secondary | ICD-10-CM

## 2023-11-02 DIAGNOSIS — Z113 Encounter for screening for infections with a predominantly sexual mode of transmission: Secondary | ICD-10-CM

## 2023-11-02 DIAGNOSIS — F419 Anxiety disorder, unspecified: Secondary | ICD-10-CM

## 2023-11-02 DIAGNOSIS — F17210 Nicotine dependence, cigarettes, uncomplicated: Secondary | ICD-10-CM

## 2023-11-02 DIAGNOSIS — F17200 Nicotine dependence, unspecified, uncomplicated: Secondary | ICD-10-CM

## 2023-11-02 MED ORDER — BUPROPION HCL ER (XL) 150 MG PO TB24
150.0000 mg | ORAL_TABLET | Freq: Every day | ORAL | 1 refills | Status: AC
  Filled 2023-11-02: qty 30, 30d supply, fill #0
  Filled 2024-03-29 – 2024-05-01 (×2): qty 30, 30d supply, fill #1

## 2023-11-02 NOTE — Telephone Encounter (Signed)
 Copied from CRM [id number]. Topic: Clinical - Request for Lab/Test Order >> Nov 02, 2023 11:17 AM [place] B wrote:  Pt wants to have orders for std testing

## 2023-11-02 NOTE — Patient Instructions (Signed)
 VISIT SUMMARY:  Today, you came in for STD testing and to discuss smoking cessation. You also shared updates on your weight loss journey and your current treatment for depression and anxiety.  YOUR PLAN:  -SEXUALLY TRANSMITTED INFECTION SCREENING: You requested testing for sexually transmitted infections (STIs) even though you have not had any known exposure. This is due to an unusual discharge you experienced last week. We will conduct a series of tests, including swabs  for gonorrhea, chlamydia, trichomonas, bacterial vaginitis, and yeast infection, as well as blood tests for HIV and herpes simplex virus.  -TOBACCO USE DISORDER: You smoke about a pack of cigarettes daily, often more when you feel anxious. We discussed using Wellbutrin  to help you quit smoking and to complement your current medication for depression. You should start by gradually reducing the number of cigarettes you smoke each day. We also talked about the mental aspects of smoking addiction and strategies to manage cravings.  -DEPRESSION AND ANXIETY: You are currently taking 40 mg of Prozac  daily, which has helped but not completely resolved your symptoms. We will add Wellbutrin  to your treatment plan to enhance the effects of Prozac  and assist with managing your depression and anxiety.

## 2023-11-02 NOTE — Telephone Encounter (Signed)
 Called patient verified name and date of birth. Patient was advised to visit the mobile clinic. Name and address, and operating hours were provided. Patient acknowledged and understood the information.   Mobile Clinic for 11/02/2023  Timing 9 am-6:30 pm.  [employer]  [address], [place] [address]

## 2023-11-02 NOTE — Progress Notes (Unsigned)
   Established Patient Office Visit  Subjective   Patient ID: Shakila Mak, female    DOB: 03-20-1986  Age: 38 y.o. MRN: [id number]  No chief complaint on file.   HPI  {History (Optional):23778}  ROS    Objective:     BP (!) 136/92 (BP Location: Left Arm, Patient Position: Sitting)   Pulse (!) 116   Ht 5' 9 (1.753 m)   Wt (!) 370 lb (167.8 kg)   LMP 10/23/2023 (Approximate)   BMI 54.64 kg/m  {Vitals History (Optional):23777}  Physical Exam   No results found for any visits on 11/02/23.  {Labs (Optional):23779}  The ASCVD Risk score (Arnett DK, et al., 2019) failed to calculate for the following reasons:   The 2019 ASCVD risk score is only valid for ages 50 to 50    Assessment & Plan:   Problem List Items Addressed This Visit       Other   PTSD (post-traumatic stress disorder)   Relevant Medications   buPROPion  (WELLBUTRIN  XL) 150 MG 24 hr tablet   Anxiety   Relevant Medications   buPROPion  (WELLBUTRIN  XL) 150 MG 24 hr tablet   Other Visit Diagnoses       Screen for sexually transmitted diseases    -  Primary   Relevant Orders   HIV antibody (with reflex)   RPR     Encounter for smoking cessation counseling       Relevant Orders   Cervicovaginal ancillary only     Tobacco use disorder       Relevant Medications   buPROPion  (WELLBUTRIN  XL) 150 MG 24 hr tablet       Return in about 2 weeks (around 11/16/2023) for With MMU.    Kirk RAMAN Mayers, PA-C

## 2023-11-02 NOTE — Progress Notes (Unsigned)
 Pt has had food and drink today. Pt states she would like assistance with smoking cessation.

## 2023-11-03 ENCOUNTER — Ambulatory Visit: Payer: Self-pay | Admitting: Physician Assistant

## 2023-11-03 ENCOUNTER — Other Ambulatory Visit: Payer: Self-pay

## 2023-11-03 DIAGNOSIS — B9689 Other specified bacterial agents as the cause of diseases classified elsewhere: Secondary | ICD-10-CM

## 2023-11-03 LAB — HIV ANTIBODY (ROUTINE TESTING W REFLEX): HIV Screen 4th Generation wRfx: NONREACTIVE

## 2023-11-03 LAB — CERVICOVAGINAL ANCILLARY ONLY
Bacterial Vaginitis (gardnerella): POSITIVE — AB
Candida Glabrata: NEGATIVE
Candida Vaginitis: NEGATIVE
Chlamydia: NEGATIVE
Comment: NEGATIVE
Comment: NEGATIVE
Comment: NEGATIVE
Comment: NEGATIVE
Comment: NEGATIVE
Comment: NORMAL
Neisseria Gonorrhea: NEGATIVE
Trichomonas: NEGATIVE

## 2023-11-03 LAB — RPR: RPR Ser Ql: NONREACTIVE

## 2023-11-03 MED ORDER — METRONIDAZOLE 500 MG PO TABS
500.0000 mg | ORAL_TABLET | Freq: Two times a day (BID) | ORAL | 0 refills | Status: AC
  Filled 2023-11-03: qty 14, 7d supply, fill #0

## 2023-11-04 ENCOUNTER — Telehealth: Payer: Self-pay | Admitting: Lactation Services

## 2023-11-04 ENCOUNTER — Telehealth: Payer: Self-pay | Admitting: Obstetrics and Gynecology

## 2023-11-04 NOTE — Telephone Encounter (Signed)
 Returned call to patient. Most recent labs completed at the [facility]. Her concern is that she keeps getting BV.   She reports she has been using Boric Acid Suppositories. She used about once a week or once every few weeks. Sent her information on the Natural Remedies.   She is concerned that the BV keeps returning, she is concerned the Flagyl  does not seem to work. Reviewed will reach out to Dr. Zina for advisement.   She has an appointment scheduled in the office for 12/09/2023.

## 2023-11-04 NOTE — Telephone Encounter (Signed)
 Patient is requesting a call back to speak to someone about her results.

## 2023-11-08 ENCOUNTER — Other Ambulatory Visit: Payer: Self-pay

## 2023-11-08 ENCOUNTER — Ambulatory Visit

## 2023-11-15 ENCOUNTER — Telehealth: Payer: Self-pay | Admitting: Internal Medicine

## 2023-11-15 NOTE — Telephone Encounter (Signed)
 Pt is scheduled for 7/22 at 4pm for follow up.

## 2023-11-16 ENCOUNTER — Ambulatory Visit: Payer: Self-pay | Admitting: Physician Assistant

## 2023-11-16 NOTE — Progress Notes (Deleted)
   Established Patient Office Visit  Subjective   Patient ID: Judy Hanna, female    DOB: 1985/11/12  Age: 38 y.o. MRN: [id number]  No chief complaint on file.   HPI  {History (Optional):23778}  ROS    Objective:     LMP 10/23/2023 (Approximate)  {Vitals History (Optional):23777}  Physical Exam   No results found for any visits on 11/16/23.  {Labs (Optional):23779}  The ASCVD Risk score (Arnett DK, et al., 2019) failed to calculate for the following reasons:   The 2019 ASCVD risk score is only valid for ages 46 to 52    Assessment & Plan:   Problem List Items Addressed This Visit   None   No follow-ups on file.    Kirk RAMAN Mayers, PA-C

## 2023-11-16 NOTE — Progress Notes (Signed)
 Patient not seen.

## 2023-11-29 ENCOUNTER — Telehealth: Payer: Self-pay | Admitting: Physician Assistant

## 2023-11-29 NOTE — Telephone Encounter (Signed)
 Contacted pt to schedule for follow up visit this week. Pt stated she is unable to come this week and wants to be placed on next weeks call list.

## 2023-12-06 ENCOUNTER — Telehealth: Payer: Self-pay | Admitting: Physician Assistant

## 2023-12-06 NOTE — Telephone Encounter (Signed)
 Contacted pt to schedule follow up visit for Mobile unit. Pt is scheduled 8/25 at 4pm

## 2023-12-09 ENCOUNTER — Ambulatory Visit: Payer: Self-pay

## 2023-12-09 ENCOUNTER — Other Ambulatory Visit: Payer: Self-pay

## 2023-12-09 ENCOUNTER — Ambulatory Visit: Admitting: Obstetrics and Gynecology

## 2023-12-09 MED ORDER — FUROSEMIDE 40 MG PO TABS
40.0000 mg | ORAL_TABLET | Freq: Every day | ORAL | 5 refills | Status: AC
  Filled 2023-12-09: qty 30, 30d supply, fill #0
  Filled 2024-03-29 – 2024-05-01 (×2): qty 30, 30d supply, fill #1

## 2023-12-09 NOTE — Telephone Encounter (Signed)
 FYI Only or Action Required?: Action required by provider: request for appointment.  Patient was last seen in primary care on 08/17/2023 by Vicci Barnie NOVAK, MD.  Called Nurse Triage reporting Leg Swelling.  Symptoms began several weeks ago.  Interventions attempted: Nothing.  Symptoms are: gradually worsening.  Triage Disposition: See PCP When Office is Open (Within 3 Days)  Patient/caregiver understands and will follow disposition?: Yes, will follow disposition  Copied from CRM #[id number]. Topic: Clinical - Red Word Triage >> Dec 09, 2023  4:50 PM Turkey B wrote: [facility] that prompted transfer to Nurse Triage: left leg is swollen, also ha bruise on back of left leg Reason for Disposition  [1] MILD swelling of both ankles (i.e., pedal edema) AND [2] new-onset or getting worse  Answer Assessment - Initial Assessment Questions 1. ONSET: When did the swelling start? (e.g., minutes, hours, days)     Probably today and yesterday per pt 2. LOCATION: What part of the leg is swollen?  Are both legs swollen or just one leg?     L leg has bruise, both swollen 3. SEVERITY: How bad is the swelling? (e.g., localized; mild, moderate, severe)     Mild-moderate 4. REDNESS: Is there redness or signs of infection?     denies 5. PAIN: Is the swelling painful to touch? If Yes, ask: How painful is it?   (Scale 1-10; mild, moderate or severe)     In area of bruise, only painful when touched 6. FEVER: Do you have a fever? If Yes, ask: What is it, how was it measured, and when did it start?      denies 7. CAUSE: What do you think is causing the leg swelling?     Chronic leg swelling, states that she does not know why bruised in one leg either  8. MEDICAL HISTORY: Do you have a history of blood clots (e.g., DVT), cancer, heart failure, kidney disease, or liver failure?     denies 9. RECURRENT SYMPTOM: Have you had leg swelling before? If Yes, ask: When was the last time?  What happened that time?     Ongoing/chronic 10. OTHER SYMPTOMS: Do you have any other symptoms? (e.g., chest pain, difficulty breathing)       denies 11. PREGNANCY: Is there any chance you are pregnant? When was your last menstrual period?       denies  Pt states that she is out of lasix  and is having trouble getting a refill. States last dose of lasix  was about 2 weeks ago  Protocols used: Leg Swelling and Edema-A-AH

## 2023-12-10 ENCOUNTER — Telehealth: Payer: Self-pay | Admitting: Internal Medicine

## 2023-12-10 ENCOUNTER — Other Ambulatory Visit: Payer: Self-pay

## 2023-12-10 NOTE — Telephone Encounter (Signed)
 Copied from CRM #[id number]. Topic: General - Other >> Dec 09, 2023  4:51 PM Turkey B wrote:  Reason for CRM: Patient wants call back about med not showing on her med list, furosemide  20mg 

## 2023-12-10 NOTE — Telephone Encounter (Signed)
 Call to patient. Recording received the number I have dialed is not in service.

## 2023-12-10 NOTE — Telephone Encounter (Signed)
 Called & spoke to the patient. Verified name & DOB. Informed patient that a refill for furosemide  was sent today 12/10/2023 with 5 additional refills. Patient expressed verbal understanding. Patient stated that she currently has swelling on bilateral legs and bruising on left calf muscle, patient denies any injuries to explain bruising. Scheduled patient the next available appointment with PCP for 12/21/23. Advised patient to visit  UC, ER or [facility] if symptoms worsen. Patient expressed verbal understanding. Please advise if there are any additional steps that need to be taken. Thank you.

## 2023-12-11 NOTE — Telephone Encounter (Signed)
 Thank you for scheduling her!

## 2023-12-15 ENCOUNTER — Telehealth: Payer: Self-pay | Admitting: Internal Medicine

## 2023-12-15 ENCOUNTER — Other Ambulatory Visit: Payer: Self-pay

## 2023-12-15 NOTE — Telephone Encounter (Signed)
Confirmed appt for 8/20

## 2023-12-16 ENCOUNTER — Ambulatory Visit: Payer: Self-pay | Admitting: Internal Medicine
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
## 2023-12-17 ENCOUNTER — Other Ambulatory Visit: Payer: Self-pay

## 2023-12-17 ENCOUNTER — Telehealth (INDEPENDENT_AMBULATORY_CARE_PROVIDER_SITE_OTHER): Payer: Self-pay | Admitting: Psychiatry

## 2023-12-17 ENCOUNTER — Encounter (HOSPITAL_COMMUNITY): Payer: Self-pay | Admitting: Psychiatry

## 2023-12-17 DIAGNOSIS — F419 Anxiety disorder, unspecified: Secondary | ICD-10-CM

## 2023-12-17 DIAGNOSIS — F331 Major depressive disorder, recurrent, moderate: Secondary | ICD-10-CM

## 2023-12-17 DIAGNOSIS — F4310 Post-traumatic stress disorder, unspecified: Secondary | ICD-10-CM

## 2023-12-17 MED ORDER — FLUOXETINE HCL 40 MG PO CAPS
40.0000 mg | ORAL_CAPSULE | Freq: Every day | ORAL | 2 refills | Status: AC
  Filled 2023-12-17: qty 30, 30d supply, fill #0
  Filled 2024-03-29 – 2024-05-01 (×2): qty 30, 30d supply, fill #1

## 2023-12-17 NOTE — Patient Instructions (Signed)

## 2023-12-20 ENCOUNTER — Ambulatory Visit: Payer: Self-pay | Admitting: Physician Assistant

## 2023-12-20 ENCOUNTER — Ambulatory Visit (INDEPENDENT_AMBULATORY_CARE_PROVIDER_SITE_OTHER): Payer: Self-pay | Admitting: Clinical

## 2023-12-20 ENCOUNTER — Other Ambulatory Visit: Payer: Self-pay

## 2023-12-20 DIAGNOSIS — F331 Major depressive disorder, recurrent, moderate: Secondary | ICD-10-CM

## 2023-12-20 NOTE — Progress Notes (Signed)
 THERAPIST PROGRESS NOTE Virtual Visit via Video Note  I connected with Judy Hanna on 12/20/2023 at  2:00 PM EDT by a video enabled telemedicine application and verified that I am speaking with the correct person using two identifiers.  Location: Patient: work Provider: office   I discussed the limitations of evaluation and management by telemedicine and the availability of in person appointments. The patient expressed understanding and agreed to proceed.   Follow Up Instructions: I discussed the assessment and treatment plan with the patient. The patient was provided an opportunity to ask questions and all were answered. The patient agreed with the plan and demonstrated an understanding of the instructions.   The patient was advised to call back or seek an in-person evaluation if the symptoms worsen or if the condition fails to improve as anticipated.   Session Time: 30 min  Participation Level: Active  Behavioral Response: CasualAlertDepressed  Type of Therapy: Individual Therapy  Treatment Goals addressed: client will engage in at least 80% of scheduled individual psychotherapy sessions  ProgressTowards Goals: Not Progressing  Interventions: CBT and Supportive  Summary:  Judy Hanna is a 38 y.o. female who presents for the scheduled appointment oriented times five, appropriately dressed and friendly. Client denied hallucinations and delusions. Client reported she has had some stress. Client reported the psychiatrist changed her medication. Client reported the friend who she previously mentioned has moved out in April 2025. Client reported since the roommate left she has been feeling lonely. Client reported she has been picking up on practices of gambling and not being at home. Client reported she thinks its her unhealthy way of coping with being home alone. Client reported the transitioned possibly triggered her past history with abandonment. Client reported she has been  engaging with associates who enable habits of gambling and have also given her money to gamble with. Client reported as a result she is having to move out of her current place because she has been behind on her rent. Client reported she also had issues of being able to get in contact with the landlord regardless for a few months. Client reported however he is not willing to really work with her to catch up on payments. Evidence of progress towards goal:  client reported 1 consequence of her action and 1 cognitive pattern which has resulted in her negative behaviors.  Suicidal/Homicidal: Nowithout intent/plan  Therapist Response:  Therapist began the appointment asking her how she has been doing. Therapist used cbt to engage with active listening and positive emotional support. Therapist used cbt to engage and ask her about changes that have occurred.  Therapist used cbt to teach the client between the difference of abandonment opposed to identifying her own choice of removing herself from certain friendships.  Therapist used cbt to collaborate and discuss taking small steps of giving space between her and triggers for negative behaviors. Therapist used CBT ask the client to identify her progress with frequency of use with coping skills with continued practice in her daily activity.    Therapist assigned her homework to practice the boundaries discussed.    Plan: Return again in 4 weeks.  Diagnosis: mdd, recurrent episode, moderate  Collaboration of Care: Patient refused AEB none requested by the client.  Patient/Guardian was advised Release of Information must be obtained prior to any record release in order to collaborate their care with an outside provider. Patient/Guardian was advised if they have not already done so to contact the registration department to sign all  necessary forms in order for us  to release information regarding their care.   Consent: Patient/Guardian gives verbal  consent for treatment and assignment of benefits for services provided during this visit. Patient/Guardian expressed understanding and agreed to proceed.   Taya Ashbaugh Y Shyler Holzman, LCSW 12/20/2023

## 2023-12-21 ENCOUNTER — Ambulatory Visit: Payer: Self-pay | Attending: Internal Medicine | Admitting: Internal Medicine

## 2023-12-21 ENCOUNTER — Other Ambulatory Visit: Payer: Self-pay

## 2023-12-21 VITALS — BP 120/77 | HR 93 | Ht 69.0 in | Wt 369.0 lb

## 2023-12-21 DIAGNOSIS — G43829 Menstrual migraine, not intractable, without status migrainosus: Secondary | ICD-10-CM

## 2023-12-21 DIAGNOSIS — Z6841 Body Mass Index (BMI) 40.0 and over, adult: Secondary | ICD-10-CM

## 2023-12-21 DIAGNOSIS — F17200 Nicotine dependence, unspecified, uncomplicated: Secondary | ICD-10-CM

## 2023-12-21 DIAGNOSIS — E559 Vitamin D deficiency, unspecified: Secondary | ICD-10-CM

## 2023-12-21 DIAGNOSIS — R600 Localized edema: Secondary | ICD-10-CM

## 2023-12-21 MED ORDER — SUMATRIPTAN SUCCINATE 50 MG PO TABS
ORAL_TABLET | ORAL | 1 refills | Status: AC
Start: 2023-12-21 — End: ?
  Filled 2023-12-21: qty 10, fill #0
  Filled 2024-02-02: qty 10, 10d supply, fill #0

## 2023-12-21 MED ORDER — NICOTINE 21 MG/24HR TD PT24
21.0000 mg | MEDICATED_PATCH | Freq: Every day | TRANSDERMAL | 1 refills | Status: AC
  Filled 2023-12-21: qty 28, 28d supply, fill #0

## 2023-12-21 NOTE — Progress Notes (Signed)
 Patient ID: Judy Hanna, female    DOB: 28-Apr-1985  MRN: [id number]  CC: Leg Swelling (Bilateral leg swelling - unsure if bruise has resolved /Hx of fibroids - swelling of middle abdomen, intermittent pain /Requesting routine blood work if due /Discuss continuation of vit D/No to flu vax. Yes to physical for another appt. )   Subjective: Judy Hanna is a 38 y.o. female who presents for chronic ds management. Her concerns today include:  Patient with history of HTN, tob dep, OSA on CPAP, obesity, PreD, fibroids, menstrual migraines, recurrent boils genital area, MDD/GAD.   Discussed the use of AI scribe software for clinical note transcription with the patient, who gave verbal consent to proceed.  History of Present Illness Judy Hanna is a 38 year old female with obesity and prediabetes who presents for weight management and medication review.  She has a history of obesity and prediabetes.  She has been seen  Dr. Avbuere at [facility] for weight management.  Initially, she was started on metformin  after blood work indicated A1c in range for preDM. The metformin  helped reduce her A1c, and she was subsequently taken off it. She is currently on phentermine  37.5 mg, which she has been taking for close to a year, and Mounjaro at the lowest dose for a couple of months. She has lost approximately 31 pounds, going from over 400 pounds to 369 pounds. She experiences decreased appetite due to the medications, sometimes needing to remind herself to eat to avoid feeling sick. She has been unable to continue her visits to [facility] due to insurance loss and is trying to manage with the remaining Mounjaro pens she has, which may last about a month.  She is hoping to get the [facility] discount cover for [facility].  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 The swelling reduces after taking her fluid pill and resting. She has tried  compression socks, but they have not been effective. The swelling is less pronounced in the morning after rest.  She has a history of depression and anxiety, for which she is seeing a psychiatrist.  She is currently on Prozac .  She was prescribed Wellbutrin  several months ago he is to help with smoking cessation.  She has not started it as yet.  She was told by her psychiatrist that there may be mild interaction of Wellbutrin  with Prozac .  She smokes about a pack of cigarettes a day and has been prescribed nicotine  patches in the past to aid in quitting but had never filled it.  She has a history of low vitamin D  levels and is currently on a once-weekly vitamin D  supplement. Her last vitamin D  level check was in August of the previous year.  She reports having a bruise on the back of the left leg about 2 to 3 weeks ago when she called for the appointment.  She is not sure how it got there and not sure if it still present.    Patient Active Problem List   Diagnosis Date Noted   Sinusitis, acute 05/13/2023   Acute tracheobronchitis 05/13/2023   Use of cannabis 02/22/2023   Anxiety 07/27/2022   MDD (major depressive disorder), recurrent episode, moderate (HCC) 05/26/2022   Fibroid uterus 06/18/2021   S/P myomectomy 06/18/2021   PTSD (post-traumatic stress disorder) 03/10/2021   BMI 50.0-59.9, adult (HCC) 06/26/2020   Tobacco abuse 06/26/2020   Fibroids 05/22/2020   Essential  hypertension 05/22/2020   Morbid obesity (HCC) 06/05/2019   Tobacco dependence 06/05/2019   Menstrual migraine without status migrainosus, not intractable 06/05/2019   History of abnormal cervical Pap smear 06/05/2019     Current Outpatient Medications on File Prior to Visit  Medication Sig Dispense Refill   albuterol  (PROVENTIL ) (2.5 MG/3ML) 0.083% nebulizer solution Take 3 mLs (2.5 mg total) by nebulization every 6 (six) hours as needed for wheezing or shortness of breath. 150 mL 1   albuterol  (VENTOLIN  HFA) 108  (90 Base) MCG/ACT inhaler Inhale 1-2 puffs into the lungs every 4 (four) hours as needed for wheezing or shortness of breath. 18 g 3   ascorbic acid  (VITAMIN C ) 500 MG tablet Take 1 tablet (500 mg total) by mouth daily. 90 tablet 2   budesonide -formoterol  (SYMBICORT ) 160-4.5 MCG/ACT inhaler Inhale 2 puffs into the lungs 2 (two) times daily. 10.2 g 3   cetirizine  (ZYRTEC ) 10 MG tablet Take 1 tablet (10 mg total) by mouth every evening as needed for allergies. 30 tablet 2   Chlorphen-PE-Acetaminophen  (NOREL AD) 4-10-325 MG TABS Take 1 tablet by mouth 2 (two) times daily as needed for cough and congestion. 20 tablet 2   FLUoxetine  (PROZAC ) 40 MG capsule Take 1 capsule (40 mg total) by mouth daily. 30 capsule 2   fluticasone  (FLONASE ) 50 MCG/ACT nasal spray Place 2 sprays into both nostrils every evening. 16 g 5   furosemide  (LASIX ) 40 MG tablet Take 1 tablet (40 mg total) by mouth daily as needed for leg swelling 30 tablet 5   losartan  (COZAAR ) 25 MG tablet Take 1 tablet (25 mg total) by mouth daily. 90 tablet 0   Multiple Vitamins-Minerals (MULTIVITAMIN WITH MINERALS) tablet Take 1 tablet by mouth 3 (three) times a week.     phentermine  (ADIPEX-P ) 37.5 MG tablet Take 1 tablet (37.5 mg total) by mouth in the morning. 30 tablet 2   phentermine  (ADIPEX-P ) 37.5 MG tablet Take 1 tablet (37.5 mg total) by mouth every morning. 30 tablet 2   Semaglutide -Weight Management (WEGOVY ) 0.25 MG/0.5ML SOAJ Inject 0.25 mg into the skin once a week. 4 mL 2   spironolactone  (ALDACTONE ) 100 MG tablet Take 1 tablet (100 mg total) by mouth daily. Take in the evening with dinner 30 tablet 3   tretinoin  (RETIN-A ) 0.025 % cream Apply topically at bedtime. 45 g 3   Vitamin D , Ergocalciferol , 50000 units CAPS Take 1 capsule by mouth once a week. 4 capsule 5   zinc gluconate 50 MG tablet Take 50 mg by mouth daily.     buPROPion  (WELLBUTRIN  XL) 150 MG 24 hr tablet Take 1 tablet (150 mg total) by mouth daily. (Patient not taking:  Reported on 12/21/2023) 30 tablet 1   Cholecalciferol  (VITAMIN D3) 50 MCG (2000 UT) CAPS Take 1 capsule (2,000 Units total) by mouth in the morning. (Patient not taking: Reported on 12/21/2023) 30 capsule 5   No current facility-administered medications on file prior to visit.    Allergies  Allergen Reactions   Cherry Itching and Swelling    Social History   Socioeconomic History   Marital status: Single    Spouse name: Not on file   Number of children: 0   Years of education: Not on file   Highest education level: Some college, no degree  Occupational History   Occupation: home health care  Tobacco Use   Smoking status: Every Day    Current packs/day: 1.00    Average packs/day: 1 pack/day for 3.0  years (3.0 ttl pk-yrs)    Types: Cigarettes   Smokeless tobacco: Never  Vaping Use   Vaping status: Never Used  Substance and Sexual Activity   Alcohol use: Yes    Comment: 1-2 drinks 2x weekly   Drug use: Not Currently    Types: Marijuana    Comment: about once monthly   Sexual activity: Yes    Partners: Female  Other Topics Concern   Not on file  Social History Narrative   ** Merged History Encounter **       Social Drivers of Health   Financial Resource Strain: Medium Risk (12/21/2023)   Overall Financial Resource Strain (CARDIA)    Difficulty of Paying Living Expenses: Somewhat hard  Food Insecurity: No Food Insecurity (12/21/2023)   Hunger Vital Sign    Worried About Running Out of Food in the Last Year: Never true    Ran Out of Food in the Last Year: Never true  Transportation Needs: No Transportation Needs (12/21/2023)   PRAPARE - Administrator, Civil Service (Medical): No    Lack of Transportation (Non-Medical): No  Physical Activity: Inactive (08/07/2022)   Exercise Vital Sign    Days of Exercise per Week: 0 days    Minutes of Exercise per Session: 0 min  Stress: Stress Concern Present (12/21/2023)   [employer] of Occupational Health -  Occupational Stress Questionnaire    Feeling of Stress: Rather much  Social Connections: Socially Isolated (12/21/2023)   Social Connection and Isolation Panel    Frequency of Communication with Friends and Family: More than three times a week    Frequency of Social Gatherings with Friends and Family: More than three times a week    Attends Religious Services: Never    Database administrator or Organizations: No    Attends Banker Meetings: Never    Marital Status: Never married  Intimate Partner Violence: Not At Risk (12/21/2023)   Humiliation, Afraid, Rape, and Kick questionnaire    Fear of Current or Ex-Partner: No    Emotionally Abused: No    Physically Abused: No    Sexually Abused: No    Family History  Problem Relation Age of Onset   Hypertension Mother    Hypertension Sister    Hypertension Maternal Grandmother     Past Surgical History:  Procedure Laterality Date   MYOMECTOMY N/A 06/18/2021   Procedure: ABDOMINAL MYOMECTOMY;  Surgeon: Zina Jerilynn LABOR, MD;  Location: [facility] OR;  Service: Gynecology;  Laterality: N/A;   NO PAST SURGERIES      ROS: Review of Systems Negative except as stated above  PHYSICAL EXAM: BP 120/77 (BP Location: Left Arm, Patient Position: Sitting, Cuff Size: Large)   Pulse 93   Ht 5' 9 (1.753 m)   Wt (!) 369 lb (167.4 kg)   SpO2 99%   BMI 54.49 kg/m   Wt Readings from Last 3 Encounters:  12/21/23 (!) 369 lb (167.4 kg)  11/02/23 (!) 370 lb (167.8 kg)  09/28/23 (!) 330 lb (149.7 kg)    Physical Exam  General appearance - alert, well appearing, morbidly obese middle-age African-American female and in no distress Mental status - normal mood, behavior, speech, dress, motor activity, and thought processes Chest - clear to auscultation, no wheezes, rales or rhonchi, symmetric air entry Heart - normal rate, regular rhythm, normal S1, S2, no murmurs, rubs, clicks or gallops Extremities -legs are large.  Nonpitting edema of the  lower legs and ankles.  Skin -no bruising noted on the legs.     12/21/2023    4:52 PM 11/02/2023    2:24 PM 05/13/2023   10:28 AM  Depression screen PHQ 2/9  Decreased Interest 2 2 1   Down, Depressed, Hopeless 2 2 0  PHQ - 2 Score 4 4 1   Altered sleeping 3 2   Tired, decreased energy 3 2   Change in appetite 1 0   Feeling bad or failure about yourself  1 0   Trouble concentrating 3 1   Moving slowly or fidgety/restless 2 1   Suicidal thoughts 0 0   PHQ-9 Score 17 10   Difficult doing work/chores Very difficult        12/21/2023    4:52 PM 11/02/2023    2:25 PM 05/13/2023   10:28 AM 01/04/2023   11:28 AM  GAD 7 : Generalized Anxiety Score  Nervous, Anxious, on Edge 3 3 1 1   Control/stop worrying 2 1 1 1   Worry too much - different things 2 1 1 1   Trouble relaxing 2 1 1 1   Restless 0 1 0 0  Easily annoyed or irritable 3 2 1 1   Afraid - awful might happen 0 0 0 1  Total GAD 7 Score 12 9 5 6   Anxiety Difficulty Somewhat difficult           Latest Ref Rng & Units 03/20/2023   12:44 PM 12/17/2022    3:52 PM 10/26/2022    3:40 PM  CMP  Glucose 70 - 99 mg/dL 890  84  885   BUN 6 - 20 mg/dL 11  10  10    Creatinine 0.44 - 1.00 mg/dL 9.18  9.20  9.13   Sodium 135 - 145 mmol/L 137  138  137   Potassium 3.5 - 5.1 mmol/L 4.0  4.2  3.7   Chloride 98 - 111 mmol/L 104  105  104   CO2 22 - 32 mmol/L 25  26  24    Calcium 8.9 - 10.3 mg/dL 8.8  9.0  8.4   Total Protein 6.1 - 8.1 g/dL  6.5  6.2   Total Bilirubin 0.2 - 1.2 mg/dL  0.6  0.6   Alkaline Phos 38 - 126 U/L   54   AST 10 - 30 U/L  9  15   ALT 6 - 29 U/L  10  15    Lipid Panel     Component Value Date/Time   CHOL 203 (H) 12/17/2022 1552   CHOL 185 06/05/2019 1131   TRIG 122 12/17/2022 1552   HDL 61 12/17/2022 1552   HDL 58 06/05/2019 1131   CHOLHDL 3.3 12/17/2022 1552   LDLCALC 119 (H) 12/17/2022 1552    CBC    Component Value Date/Time   WBC 8.6 03/20/2023 1244   RBC 4.89 03/20/2023 1244   HGB 13.5 03/20/2023 1244    HGB 13.5 06/05/2019 1131   HCT 37.6 03/20/2023 1244   HCT 39.5 06/05/2019 1131   PLT 338 03/20/2023 1244   PLT 374 06/05/2019 1131   MCV 76.9 (L) 03/20/2023 1244   MCV 79 06/05/2019 1131   MCH 27.6 03/20/2023 1244   MCHC 35.9 03/20/2023 1244   RDW 14.7 03/20/2023 1244   RDW 15.0 06/05/2019 1131   LYMPHSABS 2.4 10/26/2022 1540   MONOABS 0.7 10/26/2022 1540   EOSABS 0.4 10/26/2022 1540   BASOSABS 0.0 10/26/2022 1540    ASSESSMENT AND PLAN: 1. Tobacco dependence Advised to quit.  She wants to give a trial of the nicotine  patches.  We will start her on the 21 mg patch.  I went over with her how to use it. - nicotine  (NICODERM CQ  - DOSED IN MG/24 HOURS) 21 mg/24hr patch; Place 1 patch (21 mg total) onto the skin daily.  Dispense: 28 patch; Refill: 1  2. Menstrual migraine without status migrainosus, not intractable Patient requested refill on Imitrex  for migraines - SUMAtriptan  (IMITREX ) 50 MG tablet; Take 1 tablet at the start of the headache.  May repeat in 2 hours if headache does not resolve.  Max 2 hours / 24-hour.  Dispense: 10 tablet; Refill: 1  3. Vitamin D  deficiency (Primary) Continue once weekly high-dose vitamin D  - VITAMIN D  25 Hydroxy (Vit-D Deficiency, Fractures); Future  4. Morbid obesity (HCC) Commended her on weight loss so far.  Encouraged healthy eating habits. Advised that we do not offer Mounjaro, Ozempic  or Trulicity through patient assistance program for weight loss.  It is offered to patient with diabetes.  She will look into trying to get back in at [facility] to continue to get samples of the Mounjaro - CBC; Future - Comprehensive metabolic panel with GFR; Future - Lipid panel; Future  5. Edema of both legs Dependent edema.  Recommend getting herself good pair of compression socks and wear them during the day.  Try to get up every 30 minutes to walk for about 5 minutes during her shifts. - Brain natriuretic peptide; Future    Patient was given  the opportunity to ask questions.  Patient verbalized understanding of the plan and was able to repeat key elements of the plan.   This documentation was completed using Paediatric nurse.  Any transcriptional errors are unintentional.  Orders Placed This Encounter  Procedures   CBC   Comprehensive metabolic panel with GFR   Lipid panel   Brain natriuretic peptide   VITAMIN D  25 Hydroxy (Vit-D Deficiency, Fractures)     Requested Prescriptions   Signed Prescriptions Disp Refills   nicotine  (NICODERM CQ  - DOSED IN MG/24 HOURS) 21 mg/24hr patch 28 patch 1    Sig: Place 1 patch (21 mg total) onto the skin daily.   SUMAtriptan  (IMITREX ) 50 MG tablet 10 tablet 1    Sig: Take 1 tablet at the start of the headache.  May repeat in 2 hours if headache does not resolve.  Max 2 hours / 24-hour.    Return in about 4 months (around 04/21/2024) for give lab appt for this wk or next week.  Barnie Louder, MD, FACP

## 2023-12-21 NOTE — Patient Instructions (Signed)
 VISIT SUMMARY:  Today, you visited to discuss weight management and review your medications. You have lost 31 pounds with the help of your current medications, phentermine  and Mounjaro, but are facing insurance issues with Mounjaro. We also discussed your smoking habits, depression, prediabetes, vitamin D  deficiency, and swelling in your legs and ankles.  YOUR PLAN:  -OBESITY: Obesity is a condition where excess body fat has accumulated to the extent that it may have a negative effect on health. You have lost 31 pounds with your current medications. Continue taking phentermine  37.5 mg. We will explore financial assistance options for Mounjaro. Increase your physical activity and consider partnering with someone for accountability.  -TOBACCO USE DISORDER: Tobacco use disorder is a condition where you are dependent on nicotine . You smoke about a pack of cigarettes a day. Start using the nicotine  patches at a 21 mg dose, alternating sides daily, to help you quit smoking.  -DEPRESSION: Depression is a mood disorder that causes a persistent feeling of sadness and loss of interest. Your depression is currently managed with Prozac . Wellbutrin  has been prescribed to help with smoking cessation and may also aid in managing your depression. It is safe to use with Prozac  as per your psychiatrist.  -PREDIABETES: Prediabetes is a condition where blood sugar levels are higher than normal but not high enough to be classified as diabetes. Your A1c levels improved after taking metformin , which has been discontinued. No new medications are needed at this time.  -VITAMIN D  DEFICIENCY: Vitamin D  deficiency occurs when you do not have enough vitamin D  in your body. Continue your once-weekly vitamin D  supplementation. We will order a vitamin D  level check to monitor your status.  -LOWER EXTREMITY EDEMA: Lower extremity edema is swelling in the legs and ankles, often due to prolonged sitting. It improves with rest and  elevation. Use good quality compression socks that go below the knee and try to walk for 5 minutes every 30-60 minutes.  INSTRUCTIONS:  We will order a vitamin D  level check. Continue your current medications and follow the new recommendations. If you have any questions or concerns, please schedule a follow-up appointment.

## 2023-12-23 ENCOUNTER — Ambulatory Visit (HOSPITAL_BASED_OUTPATIENT_CLINIC_OR_DEPARTMENT_OTHER): Payer: Self-pay | Admitting: Adult Health

## 2023-12-26 ENCOUNTER — Emergency Department (HOSPITAL_COMMUNITY)
Admission: EM | Admit: 2023-12-26 | Discharge: 2023-12-26 | Disposition: A | Discharge status: Home / Self Care | Payer: MEDICAID | Attending: Emergency Medicine | Admitting: Emergency Medicine

## 2023-12-26 ENCOUNTER — Other Ambulatory Visit: Payer: Self-pay

## 2023-12-26 ENCOUNTER — Emergency Department (HOSPITAL_COMMUNITY): Payer: MEDICAID

## 2023-12-26 ENCOUNTER — Encounter (HOSPITAL_COMMUNITY): Payer: Self-pay

## 2023-12-26 DIAGNOSIS — R079 Chest pain, unspecified: Secondary | ICD-10-CM

## 2023-12-26 DIAGNOSIS — D72829 Elevated white blood cell count, unspecified: Secondary | ICD-10-CM | POA: Insufficient documentation

## 2023-12-26 DIAGNOSIS — Z7951 Long term (current) use of inhaled steroids: Secondary | ICD-10-CM | POA: Insufficient documentation

## 2023-12-26 DIAGNOSIS — J45909 Unspecified asthma, uncomplicated: Secondary | ICD-10-CM | POA: Insufficient documentation

## 2023-12-26 DIAGNOSIS — R0602 Shortness of breath: Secondary | ICD-10-CM

## 2023-12-26 DIAGNOSIS — R000 Tachycardia, unspecified: Secondary | ICD-10-CM | POA: Insufficient documentation

## 2023-12-26 DIAGNOSIS — R600 Localized edema: Secondary | ICD-10-CM | POA: Insufficient documentation

## 2023-12-26 LAB — CBC
HCT: 39.1 % (ref 36.0–46.0)
Hemoglobin: 13.7 g/dL (ref 12.0–15.0)
MCH: 27 pg (ref 26.0–34.0)
MCHC: 35 g/dL (ref 30.0–36.0)
MCV: 77 fL — ABNORMAL LOW (ref 80.0–100.0)
Platelets: 395 K/uL (ref 150–400)
RBC: 5.08 MIL/uL (ref 3.87–5.11)
RDW: 15 % (ref 11.5–15.5)
WBC: 12.4 K/uL — ABNORMAL HIGH (ref 4.0–10.5)
nRBC: 0 % (ref 0.0–0.2)

## 2023-12-26 LAB — HCG, SERUM, QUALITATIVE: Preg, Serum: NEGATIVE

## 2023-12-26 LAB — BASIC METABOLIC PANEL WITH GFR
Anion gap: 11 (ref 5–15)
BUN: 5 mg/dL — ABNORMAL LOW (ref 6–20)
CO2: 22 mmol/L (ref 22–32)
Calcium: 8.8 mg/dL — ABNORMAL LOW (ref 8.9–10.3)
Chloride: 104 mmol/L (ref 98–111)
Creatinine, Ser: 0.78 mg/dL (ref 0.44–1.00)
GFR, Estimated: >60 mL/min (ref >60)
Glucose, Bld: 144 mg/dL — ABNORMAL HIGH (ref 70–99)
Potassium: 3.4 mmol/L — ABNORMAL LOW (ref 3.5–5.1)
Sodium: 137 mmol/L (ref 135–145)

## 2023-12-26 LAB — D-DIMER, QUANTITATIVE: D-Dimer, Quant: <0.27 ug{FEU}/mL (ref 0.00–0.50)

## 2023-12-26 LAB — TROPONIN I (HIGH SENSITIVITY): Troponin I (High Sensitivity): 4 ng/L (ref <18)

## 2023-12-26 MED ORDER — BENZONATATE 100 MG PO CAPS: 100.0000 mg | ORAL_CAPSULE | Freq: Three times a day (TID) | ORAL | 0 refills | Status: AC

## 2023-12-26 MED ORDER — METHYLPREDNISOLONE SODIUM SUCC 125 MG IJ SOLR
125.0000 mg | INTRAMUSCULAR | Status: AC
  Administered 2023-12-26: 125 mg via INTRAVENOUS
  Filled 2023-12-26: qty 2

## 2023-12-26 MED ORDER — POTASSIUM CHLORIDE CRYS ER 20 MEQ PO TBCR
40.0000 meq | EXTENDED_RELEASE_TABLET | Freq: Once | ORAL | Status: AC
  Administered 2023-12-26: 40 meq via ORAL
  Filled 2023-12-26: qty 2

## 2023-12-26 MED ORDER — MAGNESIUM SULFATE 2 GM/50ML IV SOLN
2.0000 g | Freq: Once | INTRAVENOUS | Status: AC
  Administered 2023-12-26: 2 g via INTRAVENOUS
  Filled 2023-12-26: qty 50

## 2023-12-26 MED ORDER — IPRATROPIUM-ALBUTEROL 0.5-2.5 (3) MG/3ML IN SOLN
3.0000 mL | Freq: Once | RESPIRATORY_TRACT | Status: AC
  Administered 2023-12-26: 3 mL via RESPIRATORY_TRACT
  Filled 2023-12-26: qty 3

## 2023-12-26 MED ORDER — ALBUTEROL SULFATE (2.5 MG/3ML) 0.083% IN NEBU
5.0000 mg | INHALATION_SOLUTION | Freq: Once | RESPIRATORY_TRACT | Status: AC
  Administered 2023-12-26: 5 mg via RESPIRATORY_TRACT
  Filled 2023-12-26: qty 6

## 2023-12-26 MED ORDER — LORATADINE 10 MG PO TABS
10.0000 mg | ORAL_TABLET | Freq: Once | ORAL | Status: AC
  Administered 2023-12-26: 10 mg via ORAL
  Filled 2023-12-26: qty 1

## 2023-12-26 MED ORDER — SODIUM CHLORIDE 0.9 % IV BOLUS
1000.0000 mL | Freq: Once | INTRAVENOUS | Status: AC
  Administered 2023-12-26: 1000 mL via INTRAVENOUS

## 2023-12-26 MED ORDER — PREDNISONE 50 MG PO TABS
50.0000 mg | ORAL_TABLET | Freq: Every day | ORAL | 0 refills | Status: AC
Start: 2023-12-26 — End: ?

## 2023-12-26 NOTE — Discharge Instructions (Signed)
 As we discussed, take the prednisone  daily starting tomorrow (12/27/23). Use Tessalon  for cough. Continue your Albuterol  as prescribed by your doctor.   Return to the ED with any new or concerning symptoms at any time.

## 2023-12-26 NOTE — ED Triage Notes (Signed)
 Pt states she has had chest pain and shortness of breath since yesterday. Pt states she does smoke which makes her bronchitis flare up. Pt c/o cough. Pt denies nausea and vomiting.

## 2023-12-26 NOTE — ED Provider Notes (Signed)
  Physical Exam  BP (!) 147/95 (BP Location: Right Arm)   Pulse (!) 121   Temp 98.9 F (37.2 C)   Resp 20   Ht 5' 9 (1.753 m)   Wt (!) 167.4 kg   LMP 12/12/2023 (Approximate)   SpO2 100%   BMI 54.49 kg/m   Physical Exam  Procedures  Procedures  ED Course / MDM    Medical Decision Making Amount and/or Complexity of Data Reviewed Labs: ordered. Radiology: ordered. ECG/medicine tests: ordered.  Risk OTC drugs. Prescription drug management.   CP, SOB and wheezing x 2 days Tachy on arrival Pending d-dimer Nebs provided - better  7:15 - introduction to the patient She feels a little better. CXR unremarkable. Labs essentially reassuring. No fever. Tachycardia is improved. She has received the full dose of mag. D-dimer remains pending.   Wheezing on exam, actively coughing. Not acutely SOB. Albuterol  neb 5 mg, Claritin  ordered.   D-dimer negative. Patient's wheezing is improved, but remains tachycardic with minimal movement, up to 121.   One liter IV fluids provided with improvement in tachycardia. Remains in NSR. Ambulatory without hypoxia. HR max during ambulation 112. She feels much better. Discussed smoking cessation. Will provide 3 additional days prednisone . Discussed return precautions.       Odell Balls, PA-C 12/26/23 1032    Palumbo, April, MD 12/28/23 2345

## 2023-12-26 NOTE — ED Provider Notes (Signed)
 [place] [facility] AT [facility] Provider Note   CSN: [id number] Arrival date & time: 12/26/23  9478     Patient presents with: Chest Pain and Shortness of Breath   Judy Hanna is a 38 y.o. female with history of asthma, bronchitis, depression, fibroids, tobacco abuse.  Patient presents to ED complaining of shortness of breath, chest pain.  States that yesterday morning woke up with shortness of breath and chest pain that began after smoking a heavy cigarette.  She reports that she had ran out of the cigarettes and borrowed one of her friends which was heavier than normal.  Reports that her smoking the cigarette she became short of breath and had associated chest pain.  Reports that last week she had bruising, pain to her left leg which did resolve.  She denies recent surgery or travel, history of DVT or PE.  Endorsing some bilateral leg swelling but reports this is baseline.  States she tried taking breathing treatment at home without relief.  She denies fevers, nausea vomit, abdominal pain.  LMP 2 weeks ago.  Reports she has been pack-a-day smoker for quite some time.   Chest Pain Associated symptoms: shortness of breath   Shortness of Breath Associated symptoms: chest pain        Prior to Admission medications   Medication Sig Start Date End Date Taking? Authorizing Provider  albuterol  (PROVENTIL ) (2.5 MG/3ML) 0.083% nebulizer solution Take 3 mLs (2.5 mg total) by nebulization every 6 (six) hours as needed for wheezing or shortness of breath. 08/17/23   Vicci Barnie NOVAK, MD  albuterol  (VENTOLIN  HFA) 108 [phone] Base) MCG/ACT inhaler Inhale 1-2 puffs into the lungs every 4 (four) hours as needed for wheezing or shortness of breath. 08/17/23   Vicci Barnie NOVAK, MD  ascorbic acid  (VITAMIN C ) 500 MG tablet Take 1 tablet (500 mg total) by mouth daily. 12/11/21   Brien Belvie BRAVO, MD  budesonide -formoterol  (SYMBICORT ) 160-4.5 MCG/ACT inhaler Inhale 2 puffs into the  lungs 2 (two) times daily. 05/13/23   Brien Belvie BRAVO, MD  buPROPion  (WELLBUTRIN  XL) 150 MG 24 hr tablet Take 1 tablet (150 mg total) by mouth daily. Patient not taking: Reported on 12/21/2023 11/02/23   Mayers, Cari S, PA-C  cetirizine  (ZYRTEC ) 10 MG tablet Take 1 tablet (10 mg total) by mouth every evening as needed for allergies. 05/10/23     Chlorphen-PE-Acetaminophen  (NOREL AD) 4-10-325 MG TABS Take 1 tablet by mouth 2 (two) times daily as needed for cough and congestion. 09/29/23     Cholecalciferol  (VITAMIN D3) 50 MCG (2000 UT) CAPS Take 1 capsule (2,000 Units total) by mouth in the morning. Patient not taking: Reported on 12/21/2023 04/26/23   Mercy Domino A  FLUoxetine  (PROZAC ) 40 MG capsule Take 1 capsule (40 mg total) by mouth daily. 12/17/23 03/16/24  Bahraini, Sarah A  fluticasone  (FLONASE ) 50 MCG/ACT nasal spray Place 2 sprays into both nostrils every evening. 05/10/23     furosemide  (LASIX ) 40 MG tablet Take 1 tablet (40 mg total) by mouth daily as needed for leg swelling 12/09/23     losartan  (COZAAR ) 25 MG tablet Take 1 tablet (25 mg total) by mouth daily. 06/07/23   Vicci Barnie NOVAK, MD  Multiple Vitamins-Minerals (MULTIVITAMIN WITH MINERALS) tablet Take 1 tablet by mouth 3 (three) times a week.    [provider]  nicotine  (NICODERM CQ  - DOSED IN MG/24 HOURS) 21 mg/24hr patch Place 1 patch (21 mg total) onto the skin daily. 12/21/23  Vicci Barnie NOVAK, MD  phentermine  (ADIPEX-P ) 37.5 MG tablet Take 1 tablet (37.5 mg total) by mouth in the morning. 04/30/23   Shelda Atlas, MD  phentermine  (ADIPEX-P ) 37.5 MG tablet Take 1 tablet (37.5 mg total) by mouth every morning. 07/07/23   Shelda Atlas, MD  Semaglutide -Weight Management (WEGOVY ) 0.25 MG/0.5ML SOAJ Inject 0.25 mg into the skin once a week. 06/07/23     spironolactone  (ALDACTONE ) 100 MG tablet Take 1 tablet (100 mg total) by mouth daily. Take in the evening with dinner 09/30/23   Alm Delon SAILOR, DO  SUMAtriptan  (IMITREX )  50 MG tablet Take 1 tablet at the start of the headache.  May repeat in 2 hours if headache does not resolve.  Max 2 hours / 24-hour. 12/21/23   Vicci Barnie NOVAK, MD  tretinoin  (RETIN-A ) 0.025 % cream Apply topically at bedtime. 09/30/23 09/29/24  Alm Delon SAILOR, DO  Vitamin D , Ergocalciferol , 50000 units CAPS Take 1 capsule by mouth once a week. 07/28/23     zinc gluconate 50 MG tablet Take 50 mg by mouth daily.    [provider]    Allergies: Cherry    Review of Systems  Respiratory:  Positive for shortness of breath.   Cardiovascular:  Positive for chest pain.  All other systems reviewed and are negative.   Updated Vital Signs BP (!) 147/95 (BP Location: Right Arm)   Pulse (!) 121   Temp 98.9 F (37.2 C)   Resp 20   Ht 5' 9 (1.753 m)   Wt (!) 167.4 kg   LMP 12/12/2023 (Approximate)   SpO2 100%   BMI 54.49 kg/m   Physical Exam Vitals and nursing note reviewed.  Constitutional:      General: She is not in acute distress.    Appearance: She is well-developed.  HENT:     Head: Normocephalic and atraumatic.  Eyes:     Conjunctiva/sclera: Conjunctivae normal.  Cardiovascular:     Rate and Rhythm: Regular rhythm. Tachycardia present.     Heart sounds: No murmur heard. Pulmonary:     Effort: Pulmonary effort is normal. No respiratory distress.     Breath sounds: Wheezing present.  Abdominal:     Palpations: Abdomen is soft.     Tenderness: There is no abdominal tenderness.  Musculoskeletal:        General: No swelling.     Cervical back: Neck supple.     Right lower leg: Edema present.     Left lower leg: Edema present.  Skin:    General: Skin is warm and dry.     Capillary Refill: Capillary refill takes less than 2 seconds.  Neurological:     Mental Status: She is alert.  Psychiatric:        Mood and Affect: Mood normal.     (all labs ordered are listed, but only abnormal results are displayed) Labs Reviewed  BASIC METABOLIC PANEL WITH GFR -  Abnormal; Notable for the following components:      Result Value   Potassium 3.4 (*)    Glucose, Bld 144 (*)    BUN 5 (*)    Calcium 8.8 (*)    All other components within normal limits  CBC - Abnormal; Notable for the following components:   WBC 12.4 (*)    MCV 77.0 (*)    All other components within normal limits  HCG, SERUM, QUALITATIVE  D-DIMER, QUANTITATIVE  TROPONIN I (HIGH SENSITIVITY)    EKG: None  Radiology: No  results found.  Procedures   Medications Ordered in the ED  magnesium  sulfate IVPB 2 g 50 mL (2 g Intravenous New Bag/Given 12/26/23 0626)  potassium chloride  SA (KLOR-CON  M) CR tablet 40 mEq (has no administration in time range)  ipratropium-albuterol  (DUONEB) 0.5-2.5 (3) MG/3ML nebulizer solution 3 mL (3 mLs Nebulization Given 12/26/23 0622)  methylPREDNISolone  sodium succinate (SOLU-MEDROL ) 125 mg/2 mL injection 125 mg (125 mg Intravenous Given 12/26/23 9375)      Medical Decision Making Amount and/or Complexity of Data Reviewed Labs: ordered. Radiology: ordered. ECG/medicine tests: ordered.   37 year old female presents for evaluation of shortness of breath and chest pain.  On exam, she is tachycardic, afebrile.  Lung sounds have wheezing throughout, oxygen saturation 100% room air.  Abdomen soft and compressible.  Neuroexam at baseline.  CBC with leukocytosis, no anemia. CMP with potassium 3.4 repleted with 40mEq of oral potassium. No other electrolyte derangement. HCG negative. Troponin pending, ddimer pending. EKG pending.  Patient given duoneb, solumedrol, mag.   At end of shift, patient workup pending. Signed out to oncoming provider Upstill PA-C. Plan of management discussed.    Final diagnoses:  Chest pain, unspecified type  Shortness of breath  Uncomplicated asthma, unspecified asthma severity, unspecified whether persistent    ED Discharge Orders     None          Judy Hanna 12/26/23 0636    Palumbo, April,  MD 12/26/23 [phone]

## 2023-12-28 ENCOUNTER — Telehealth: Payer: Self-pay | Admitting: Internal Medicine

## 2023-12-28 ENCOUNTER — Other Ambulatory Visit: Payer: Self-pay

## 2023-12-28 ENCOUNTER — Encounter: Payer: Self-pay | Admitting: Internal Medicine

## 2023-12-28 ENCOUNTER — Ambulatory Visit: Payer: Self-pay | Attending: Internal Medicine | Admitting: Internal Medicine

## 2023-12-28 VITALS — BP 125/80 | HR 101 | Temp 98.3°F | Ht 69.0 in | Wt 369.0 lb

## 2023-12-28 DIAGNOSIS — Z6841 Body Mass Index (BMI) 40.0 and over, adult: Secondary | ICD-10-CM

## 2023-12-28 DIAGNOSIS — F17200 Nicotine dependence, unspecified, uncomplicated: Secondary | ICD-10-CM

## 2023-12-28 DIAGNOSIS — R600 Localized edema: Secondary | ICD-10-CM

## 2023-12-28 DIAGNOSIS — J4531 Mild persistent asthma with (acute) exacerbation: Secondary | ICD-10-CM

## 2023-12-28 DIAGNOSIS — Z79899 Other long term (current) drug therapy: Secondary | ICD-10-CM

## 2023-12-28 DIAGNOSIS — E559 Vitamin D deficiency, unspecified: Secondary | ICD-10-CM

## 2023-12-28 DIAGNOSIS — F331 Major depressive disorder, recurrent, moderate: Secondary | ICD-10-CM

## 2023-12-28 MED ORDER — BUDESONIDE-FORMOTEROL FUMARATE 160-4.5 MCG/ACT IN AERO
2.0000 | INHALATION_SPRAY | Freq: Two times a day (BID) | RESPIRATORY_TRACT | 5 refills | Status: AC
  Filled 2023-12-28: qty 10.2, 30d supply, fill #0
  Filled 2024-05-01: qty 10.2, 30d supply, fill #1

## 2023-12-28 MED ORDER — PREDNISONE 20 MG PO TABS
ORAL_TABLET | ORAL | 0 refills | Status: AC
  Filled 2023-12-28: qty 5, 3d supply, fill #0

## 2023-12-28 MED ORDER — AZITHROMYCIN 250 MG PO TABS
ORAL_TABLET | ORAL | 0 refills | Status: AC
  Filled 2023-12-28: qty 6, 5d supply, fill #0

## 2023-12-28 NOTE — Patient Instructions (Signed)
 VISIT SUMMARY:  You came in today for a follow-up after your recent emergency room visit for chest pain and shortness of breath. You experienced a bronchitis flare-up triggered by smoking, which led to wheezing and persistent symptoms despite home treatments. In the ER, you received multiple treatments and medications to stabilize your condition. You also discussed your ongoing depression and current medications, as well as your vitamin D  deficiency.  YOUR PLAN:  -ASTHMA WITH ACUTE EXACERBATION: An asthma exacerbation is a worsening of asthma symptoms, often triggered by factors like smoking. You were treated in the ER with nebulizer treatments, IV fluids, prednisone , magnesium , and potassium. We will refill your Symbicort  inhaler and prescribe tapering doses of prednisone  with Zithromax  antibiotic. You should stay home from work until Thursday to recover fully. Return to work on Friday.  -MAJOR DEPRESSIVE DISORDER: Major depressive disorder is a mental health condition characterized by persistent feelings of sadness and loss of interest. You are currently taking Wellbutrin  and Prozac , though Prozac  does not seem to be helping. Continue taking Prozac  and discuss its effectiveness with your behavioral health provider at your next appointment.  -VITAMIN D  DEFICIENCY: Vitamin D  deficiency means you have lower than normal levels of vitamin D , which is important for bone health. We will order a vitamin D  level test to monitor your condition.  INSTRUCTIONS:  Please stay home from work until Thursday to allow yourself time to recover. Continue taking your medications as prescribed and follow up with your behavioral health provider to discuss your depression treatment. We will also need to check your vitamin D  levels, so please get the lab test done as soon as possible.

## 2023-12-28 NOTE — Telephone Encounter (Signed)
 Copied from CRM [id number]. Topic: Appointments - Appointment Scheduling >> Dec 28, 2023  8:56 AM Pinkey ORN wrote:  Patient/patient representative is calling to schedule an appointment. Refer to attachments for appointment information.  >> Dec 28, 2023  9:01 AM Pinkey ORN wrote:  Patient needs to schedule a follow up appointment, discharged from ER 12/26/23

## 2023-12-28 NOTE — Telephone Encounter (Signed)
 Called & spoke to the patient. Verified name & DOB. Inquired if patient would like to be seen for an appointment today 12/28/2023. Patient agreed and will have all questions / concerns addressed during appointment.

## 2023-12-28 NOTE — Telephone Encounter (Signed)
 Copied from CRM [id number]. Topic: Clinical - Medication Question >> Dec 28, 2023  8:55 AM Pinkey ORN wrote: Reason for CRM: Medication Request >> Dec 28, 2023  8:56 AM Pinkey ORN wrote: Patient is wanting to know if there's any antibiotic she could be prescribed for her bronchitis

## 2023-12-28 NOTE — Telephone Encounter (Signed)
 Called patient, Patient has been scheduled for the next available hospital follow up appointment.   Patient is requesting an antibiotic for bronchitis please advise.

## 2023-12-28 NOTE — Progress Notes (Signed)
 Patient ID: Judy Hanna, female    DOB: [birth year]  MRN: [id number]  CC: Follow-up (ER f/u. /Pt experiencing bronchitis - requesting antibiotics /Yes to flu vax)   Subjective: Judy Hanna is a 38 y.o. female who presents for chronic ds management. Her concerns today include:  Patient with history of HTN, tob dep, OSA on CPAP, obesity, PreD, fibroids, menstrual migraines, recurrent boils genital area, MDD/GAD.   Discussed the use of AI scribe software for clinical note transcription with the patient, who gave verbal consent to proceed.  History of Present Illness Judy Hanna is a 38 year old female with asthma and bronchitis who presents for follow-up after an emergency room visit for chest pain and shortness of breath.  She was seen in the emergency room on August 31st due to chest pain and shortness of breath after smoking a heavy cigarette, which she believes triggered a bronchitis flare-up. She experienced wheezing and shortness of breath, and despite taking nebulizer treatments and albuterol  inhaler at home, her symptoms persisted. In the ER, she required two neb treatments to control the wheezing and was given IV fluids, prednisone , magnesium , and potassium due to low potassium levels. CXR was normal. Her heart rate was high, and once stabilized, she was discharged with Prednisone  50 mg to take for 3 days.  She reports a persistent cough with thick mucus production, though she did not have a fever throughout the episode.  She feels better on prednisone  but still experiences wheezing and discomfort when mucus is present. She continues to smoke but working on quitting. Started using patches and took Wellbutrin  starting today. Has Albuterol  but has been out of Symbicort  for a while.  Depression screen positive on last visit last mth. Regarding her mental health, she acknowledges depression as a major issue, which she struggles to manage. She is currently taking Wellbutrin  and Prozac ,  though she feels Prozac  is not helping. She is connected with behavioral health services and sees a psychiatrist with her next follow-up in about a month. Her therapy appointment was rescheduled from September 10th to October.  I have seen her last week and we had ordered some labs on her including vitamin D  level.  She has history of vitamin D  deficiency.  We had also ordered lipid profile due to the obesity.  She would like to get the labs done while she is here today.     Patient Active Problem List   Diagnosis Date Noted   Sinusitis, acute 05/13/2023   Acute tracheobronchitis 05/13/2023   Use of cannabis 02/22/2023   Anxiety 07/27/2022   MDD (major depressive disorder), recurrent episode, moderate (HCC) 05/26/2022   Fibroid uterus 06/18/2021   S/P myomectomy 06/18/2021   PTSD (post-traumatic stress disorder) 03/10/2021   BMI 50.0-59.9, adult (HCC) 06/26/2020   Tobacco abuse 06/26/2020   Fibroids 05/22/2020   Essential hypertension 05/22/2020   Morbid obesity (HCC) 06/05/2019   Tobacco dependence 06/05/2019   Menstrual migraine without status migrainosus, not intractable 06/05/2019   History of abnormal cervical Pap smear 06/05/2019     Current Outpatient Medications on File Prior to Visit  Medication Sig Dispense Refill   albuterol  (PROVENTIL ) (2.5 MG/3ML) 0.083% nebulizer solution Take 3 mLs (2.5 mg total) by nebulization every 6 (six) hours as needed for wheezing or shortness of breath. 150 mL 1   albuterol  (VENTOLIN  HFA) 108 (90 Base) MCG/ACT inhaler Inhale 1-2 puffs into the lungs every 4 (four) hours as needed for wheezing or shortness of breath. 18  g 3   ascorbic acid  (VITAMIN C ) 500 MG tablet Take 1 tablet (500 mg total) by mouth daily. 90 tablet 2   benzonatate  (TESSALON ) 100 MG capsule Take 1 capsule (100 mg total) by mouth every 8 (eight) hours. 21 capsule 0   buPROPion  (WELLBUTRIN  XL) 150 MG 24 hr tablet Take 1 tablet (150 mg total) by mouth daily. 30 tablet 1    cetirizine  (ZYRTEC ) 10 MG tablet Take 1 tablet (10 mg total) by mouth every evening as needed for allergies. 30 tablet 2   Chlorphen-PE-Acetaminophen  (NOREL AD) 4-10-325 MG TABS Take 1 tablet by mouth 2 (two) times daily as needed for cough and congestion. 20 tablet 2   Cholecalciferol  (VITAMIN D3) 50 MCG (2000 UT) CAPS Take 1 capsule (2,000 Units total) by mouth in the morning. 30 capsule 5   FLUoxetine  (PROZAC ) 40 MG capsule Take 1 capsule (40 mg total) by mouth daily. 30 capsule 2   fluticasone  (FLONASE ) 50 MCG/ACT nasal spray Place 2 sprays into both nostrils every evening. 16 g 5   furosemide  (LASIX ) 40 MG tablet Take 1 tablet (40 mg total) by mouth daily as needed for leg swelling 30 tablet 5   losartan  (COZAAR ) 25 MG tablet Take 1 tablet (25 mg total) by mouth daily. 90 tablet 0   Multiple Vitamins-Minerals (MULTIVITAMIN WITH MINERALS) tablet Take 1 tablet by mouth 3 (three) times a week.     nicotine  (NICODERM CQ  - DOSED IN MG/24 HOURS) 21 mg/24hr patch Place 1 patch (21 mg total) onto the skin daily. 28 patch 1   phentermine  (ADIPEX-P ) 37.5 MG tablet Take 1 tablet (37.5 mg total) by mouth in the morning. 30 tablet 2   phentermine  (ADIPEX-P ) 37.5 MG tablet Take 1 tablet (37.5 mg total) by mouth every morning. 30 tablet 2   predniSONE  (DELTASONE ) 50 MG tablet Take 1 tablet (50 mg total) by mouth daily. 3 tablet 0   Semaglutide -Weight Management (WEGOVY ) 0.25 MG/0.5ML SOAJ Inject 0.25 mg into the skin once a week. 4 mL 2   spironolactone  (ALDACTONE ) 100 MG tablet Take 1 tablet (100 mg total) by mouth daily. Take in the evening with dinner 30 tablet 3   SUMAtriptan  (IMITREX ) 50 MG tablet Take 1 tablet at the start of the headache.  May repeat in 2 hours if headache does not resolve.  Max 2 hours / 24-hour. 10 tablet 1   tretinoin  (RETIN-A ) 0.025 % cream Apply topically at bedtime. 45 g 3   Vitamin D , Ergocalciferol , 50000 units CAPS Take 1 capsule by mouth once a week. 4 capsule 5   zinc  gluconate 50 MG tablet Take 50 mg by mouth daily.     No current facility-administered medications on file prior to visit.    Allergies  Allergen Reactions   Cherry Itching and Swelling    Social History   Socioeconomic History   Marital status: Single    Spouse name: Not on file   Number of children: 0   Years of education: Not on file   Highest education level: Some college, no degree  Occupational History   Occupation: home health care  Tobacco Use   Smoking status: Every Day    Current packs/day: 1.00    Average packs/day: 1 pack/day for 3.0 years (3.0 ttl pk-yrs)    Types: Cigarettes   Smokeless tobacco: Never  Vaping Use   Vaping status: Never Used  Substance and Sexual Activity   Alcohol use: Yes    Comment: 1-2 drinks  2x weekly   Drug use: Not Currently    Types: Marijuana    Comment: about once monthly   Sexual activity: Yes    Partners: Female  Other Topics Concern   Not on file  Social History Narrative   ** Merged History Encounter **       Social Drivers of Health   Financial Resource Strain: Medium Risk (12/21/2023)   Overall Financial Resource Strain (CARDIA)    Difficulty of Paying Living Expenses: Somewhat hard  Food Insecurity: No Food Insecurity (12/21/2023)   Hunger Vital Sign    Worried About Running Out of Food in the Last Year: Never true    Ran Out of Food in the Last Year: Never true  Transportation Needs: No Transportation Needs (12/21/2023)   PRAPARE - Administrator, Civil Service (Medical): No    Lack of Transportation (Non-Medical): No  Physical Activity: Inactive (08/07/2022)   Exercise Vital Sign    Days of Exercise per Week: 0 days    Minutes of Exercise per Session: 0 min  Stress: Stress Concern Present (12/21/2023)   [employer] of Occupational Health - Occupational Stress Questionnaire    Feeling of Stress: Rather much  Social Connections: Socially Isolated (12/21/2023)   Social Connection and Isolation  Panel    Frequency of Communication with Friends and Family: More than three times a week    Frequency of Social Gatherings with Friends and Family: More than three times a week    Attends Religious Services: Never    Database administrator or Organizations: No    Attends Banker Meetings: Never    Marital Status: Never married  Intimate Partner Violence: Not At Risk (12/21/2023)   Humiliation, Afraid, Rape, and Kick questionnaire    Fear of Current or Ex-Partner: No    Emotionally Abused: No    Physically Abused: No    Sexually Abused: No    Family History  Problem Relation Age of Onset   Hypertension Mother    Hypertension Sister    Hypertension Maternal Grandmother     Past Surgical History:  Procedure Laterality Date   MYOMECTOMY N/A 06/18/2021   Procedure: ABDOMINAL MYOMECTOMY;  Surgeon: Zina Jerilynn LABOR, MD;  Location[facility];  Service: Gynecology;  Laterality: N/A;   NO PAST SURGERIES      ROS: Review of Systems Negative except as stated above  PHYSICAL EXAM: BP 125/80 (BP Location: Left Arm, Patient Position: Sitting, Cuff Size: Large)   Pulse (!) 101   Temp 98.3 F (36.8 C) (Oral)   Ht 5' 9 (1.753 m)   Wt (!) 369 lb (167.4 kg)   LMP 12/12/2023 (Approximate)   SpO2 98%   BMI 54.49 kg/m   Physical Exam  General appearance - alert, well appearing, morbidly obese AAF and in no distress Mental status - normal mood, behavior, speech, dress, motor activity, and thought processes Chest -few scattered respiratory wheezes with good air entry Heart - normal rate, regular rhythm, normal S1, S2, no murmurs, rubs, clicks or gallops     12/21/2023    4:52 PM 11/02/2023    2:24 PM 05/13/2023   10:28 AM  Depression screen PHQ 2/9  Decreased Interest 2 2 1   Down, Depressed, Hopeless 2 2 0  PHQ - 2 Score 4 4 1   Altered sleeping 3 2   Tired, decreased energy 3 2   Change in appetite 1 0   Feeling bad or failure about yourself  1 0   Trouble concentrating 3  1   Moving slowly or fidgety/restless 2 1   Suicidal thoughts 0 0   PHQ-9 Score 17 10   Difficult doing work/chores Very difficult          Latest Ref Rng & Units 12/26/2023    5:54 AM 03/20/2023   12:44 PM 12/17/2022    3:52 PM  CMP  Glucose 70 - 99 mg/dL 855  890  84   BUN 6 - 20 mg/dL 5  11  10    Creatinine 0.44 - 1.00 mg/dL 9.21  9.18  9.20   Sodium 135 - 145 mmol/L 137  137  138   Potassium 3.5 - 5.1 mmol/L 3.4  4.0  4.2   Chloride 98 - 111 mmol/L 104  104  105   CO2 22 - 32 mmol/L 22  25  26    Calcium 8.9 - 10.3 mg/dL 8.8  8.8  9.0   Total Protein 6.1 - 8.1 g/dL   6.5   Total Bilirubin 0.2 - 1.2 mg/dL   0.6   AST 10 - 30 U/L   9   ALT 6 - 29 U/L   10    Lipid Panel     Component Value Date/Time   CHOL 203 (H) 12/17/2022 1552   CHOL 185 06/05/2019 1131   TRIG 122 12/17/2022 1552   HDL 61 12/17/2022 1552   HDL 58 06/05/2019 1131   CHOLHDL 3.3 12/17/2022 1552   LDLCALC 119 (H) 12/17/2022 1552    CBC    Component Value Date/Time   WBC 12.4 (H) 12/26/2023 0554   RBC 5.08 12/26/2023 0554   HGB 13.7 12/26/2023 0554   HGB 13.5 06/05/2019 1131   HCT 39.1 12/26/2023 0554   HCT 39.5 06/05/2019 1131   PLT 395 12/26/2023 0554   PLT 374 06/05/2019 1131   MCV 77.0 (L) 12/26/2023 0554   MCV 79 06/05/2019 1131   MCH 27.0 12/26/2023 0554   MCHC 35.0 12/26/2023 0554   RDW 15.0 12/26/2023 0554   RDW 15.0 06/05/2019 1131   LYMPHSABS 2.4 10/26/2022 1540   MONOABS 0.7 10/26/2022 1540   EOSABS 0.4 10/26/2022 1540   BASOSABS 0.0 10/26/2022 1540    ASSESSMENT AND PLAN: 1. Mild persistent asthma with exacerbation (Primary) I will give tapering dose of prednisone  along with a Z-Pak and refill of Symbicort .  I recommend using the Symbicort  inhaler daily. Strongly advised to quit smoking Work excuse given through 12/30/23 - budesonide -formoterol  (SYMBICORT ) 160-4.5 MCG/ACT inhaler; Inhale 2 puffs into the lungs 2 (two) times daily.  Dispense: 10.2 g; Refill: 5 - predniSONE   (DELTASONE ) 20 MG tablet; 2 tabs Po daily x 1 days then 1.5 tab Po daily x 1 day then 1 tab po x 1 day  Dispense: 5 tablet; Refill: 0 - azithromycin  (ZITHROMAX  Z-PAK) 250 MG tablet; Take 2 tablets (500 mg total) by mouth daily for 1 day, THEN 1 tablet (250 mg total) daily for 4 days.  Dispense: 6 tablet; Refill: 0  2. Tobacco dependence Strongly advised to quit smoking.  Patient states she is using the nicotine  patches and is giving trial of Wellbutrin  as well.  3. Major depressive disorder, recurrent episode, moderate (HCC) Plugged in with behavioral health services.  Advised patient to speak with her behavioral health provider if she feels the Prozac  is not helping.  4. Vitamin D  deficiency - VITAMIN D  25 Hydroxy (Vit-D Deficiency, Fractures)  5. Morbid obesity (HCC) - Lipid panel -  Hepatic Function Panel    Patient was given the opportunity to ask questions.  Patient verbalized understanding of the plan and was able to repeat key elements of the plan.   This documentation was completed using Paediatric nurse.  Any transcriptional errors are unintentional.  Orders Placed This Encounter  Procedures   VITAMIN D  25 Hydroxy (Vit-D Deficiency, Fractures)   Lipid panel   Hepatic Function Panel     Requested Prescriptions   Signed Prescriptions Disp Refills   budesonide -formoterol  (SYMBICORT ) 160-4.5 MCG/ACT inhaler 10.2 g 5    Sig: Inhale 2 puffs into the lungs 2 (two) times daily.   predniSONE  (DELTASONE ) 20 MG tablet 5 tablet 0    Sig: Take 2 tablets (40 mg total) by mouth daily for 1 day, THEN 1.5 tablets (30 mg total) daily for 1 day, THEN 1 tablet (20 mg total) daily for 1 day.   azithromycin  (ZITHROMAX  Z-PAK) 250 MG tablet 6 tablet 0    Sig: Take 2 tablets (500 mg total) by mouth daily for 1 day, THEN 1 tablet (250 mg total) daily for 4 days.    No follow-ups on file.  Barnie Louder, MD, FACP

## 2023-12-29 ENCOUNTER — Ambulatory Visit: Payer: Self-pay | Admitting: Internal Medicine

## 2023-12-29 ENCOUNTER — Other Ambulatory Visit: Payer: Self-pay

## 2023-12-29 LAB — HEPATIC FUNCTION PANEL
ALT: 14 IU/L (ref 0–32)
AST: 12 IU/L (ref 0–40)
Albumin: 4.1 g/dL (ref 3.9–4.9)
Alkaline Phosphatase: 80 IU/L (ref 44–121)
Bilirubin Total: 0.5 mg/dL (ref 0.0–1.2)
Bilirubin, Direct: 0.16 mg/dL (ref 0.00–0.40)
Total Protein: 6.5 g/dL (ref 6.0–8.5)

## 2023-12-29 LAB — LIPID PANEL
Chol/HDL Ratio: 3 ratio (ref 0.0–4.4)
Cholesterol, Total: 210 mg/dL — ABNORMAL HIGH (ref 100–199)
HDL: 69 mg/dL (ref >39)
LDL Chol Calc (NIH): 126 mg/dL — ABNORMAL HIGH (ref 0–99)
Triglycerides: 86 mg/dL (ref 0–149)
VLDL Cholesterol Cal: 15 mg/dL (ref 5–40)

## 2023-12-29 LAB — VITAMIN D 25 HYDROXY (VIT D DEFICIENCY, FRACTURES): Vit D, 25-Hydroxy: 22.4 ng/mL — ABNORMAL LOW (ref 30.0–100.0)

## 2024-01-03 ENCOUNTER — Ambulatory Visit: Payer: Self-pay

## 2024-01-03 NOTE — Telephone Encounter (Signed)
 FYI Only or Action Required?: Action required by provider: request for appointment.  Patient was last seen in primary care on 12/28/2023 by Vicci Barnie NOVAK, MD.  Called Nurse Triage reporting Cough.  Symptoms began a week ago.  Interventions attempted: Rest, hydration, or home remedies.  Symptoms are: unchanged.  Triage Disposition: See PCP When Office is Open (Within 3 Days)  Patient/caregiver understands and will follow disposition?: Yes, will follow disposition  Copied from CRM [id number]. Topic: Clinical - Red Word Triage >> Jan 03, 2024  9:28 AM Judy Hanna wrote: Red Word that prompted transfer to Nurse Triage: Cough worsened, wheezing. Went to ER on 8/31, Saw PCP on 9/2 Reason for Disposition  Cough has been present for > 3 weeks  Answer Assessment - Initial Assessment Questions 1. ONSET: When did the cough begin?      8/31 2. SEVERITY: How bad is the cough today?      Mild-moderate 3. SPUTUM: Describe the color of your sputum (e.g., none, dry cough; clear, white, yellow, green)     At times, worse right at onset, sometimes clear sometimes yellow 4. HEMOPTYSIS: Are you coughing up any blood? If Yes, ask: How much? (e.g., flecks, streaks, tablespoons, etc.)     denies 5. DIFFICULTY BREATHING: Are you having difficulty breathing? If Yes, ask: How bad is it? (e.g., mild, moderate, severe)      Wheezing at times 6. FEVER: Do you have a fever? If Yes, ask: What is your temperature, how was it measured, and when did it start?     denies 10. OTHER SYMPTOMS: Do you have any other symptoms? (e.g., runny nose, wheezing, chest pain)       Wheezing at times, lost voice in middle of illness-present at this time 11. PREGNANCY: Is there any chance you are pregnant? When was your last menstrual period?       denies 39. TRAVEL: Have you traveled out of the country in the last month? (e.g., travel history, exposures)       Denies  Pt needs appt, no appts  available at time of call.  Protocols used: Cough - Acute Productive-A-AH

## 2024-01-03 NOTE — Telephone Encounter (Signed)
 Please reach out to patient and get her scheduled.

## 2024-01-05 ENCOUNTER — Encounter: Payer: Self-pay | Admitting: Family Medicine

## 2024-01-05 ENCOUNTER — Ambulatory Visit: Payer: Self-pay | Attending: Family Medicine | Admitting: Family Medicine

## 2024-01-05 ENCOUNTER — Other Ambulatory Visit: Payer: Self-pay

## 2024-01-05 ENCOUNTER — Ambulatory Visit (HOSPITAL_COMMUNITY): Admitting: Clinical

## 2024-01-05 VITALS — BP 118/82 | HR 103 | Ht 69.0 in | Wt 369.4 lb

## 2024-01-05 DIAGNOSIS — J40 Bronchitis, not specified as acute or chronic: Secondary | ICD-10-CM

## 2024-01-05 DIAGNOSIS — J4531 Mild persistent asthma with (acute) exacerbation: Secondary | ICD-10-CM

## 2024-01-05 MED ORDER — AMOXICILLIN-POT CLAVULANATE 875-125 MG PO TABS
1.0000 | ORAL_TABLET | Freq: Two times a day (BID) | ORAL | 0 refills | Status: AC
  Filled 2024-01-05: qty 20, 10d supply, fill #0

## 2024-01-05 MED ORDER — HYDROCOD POLI-CHLORPHE POLI ER 10-8 MG/5ML PO SUER
5.0000 mL | Freq: Two times a day (BID) | ORAL | 0 refills | Status: AC | PRN
  Filled 2024-01-05 – 2024-02-23 (×3): qty 115, 12d supply, fill #0

## 2024-01-05 NOTE — Telephone Encounter (Signed)
Pt had appointment today.

## 2024-01-05 NOTE — Patient Instructions (Signed)
 VISIT SUMMARY:  Today, we discussed your persistent cough and wheezing, which have been ongoing for over a week. You have experienced some improvement with previous medications but still have symptoms. We also talked about your smoking habits and weight management.  YOUR PLAN:  -ASTHMA WITH ACUTE BRONCHITIS AND PRODUCTIVE COUGH: You have asthma with a recent episode of bronchitis, which is causing your persistent cough and wheezing. We will prescribe a different antibiotic to address any potential bacterial infection and a cough expectorant to help clear mucus. Please limit your use of the albuterol  inhaler to four times daily. Monitor for any fever or worsening symptoms and return if these occur.  -TOBACCO USE: Smoking is making your bronchitis and asthma worse. We strongly encourage you to quit smoking to help improve your asthma control.  -OBESITY: Your increased appetite from prednisone  may be affecting your weight. You are on weight loss medication, which has shown some reduction. We discussed how prednisone  can counteract weight loss efforts.  INSTRUCTIONS:  Please follow the new prescriptions as directed and limit your albuterol  inhaler use to four times daily. Monitor for any fever or worsening symptoms and return if these occur. Consider quitting smoking to help improve your asthma control.

## 2024-01-05 NOTE — Progress Notes (Signed)
 Subjective:  Patient ID: Judy Hanna, female    DOB: Jan 20, 1986  Age: 38 y.o. MRN: [id number]  CC: Cough and Wheezing     Discussed the use of AI scribe software for clinical note transcription with the patient, who gave verbal consent to proceed.  History of Present Illness Judy Hanna is a 38 year old female  patient of Dr Vicci with asthma,  Nicotine  dependence, MDD, Obesity  who presents with persistent cough and wheezing.  She has had a persistent productive cough and wheezing for over a week, with occasional shortness of breath. She experienced a temporary loss of voice and a raspy throat, with some pain in the back of her throat a few days ago. There is no fever, body aches, chills, or significant nasal drip. No current chest pain.  She was previously prescribed prednisone  taper and azithromycin  by PCP 1 week ago  which provided some improvement. She was at the ED on August 31st for chest pain and cough. A chest x-ray showed no acute process, and a D-dimer test was negative. She completed Prednisone  50mg  x 3 days after that visit  Her asthma is managed with Symbicort , an albuterol  inhaler, and a nebulizer machine. She has been using her albuterol  inhaler frequently.She has not used the nebulizer today but did use it yesterday.  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  She is a smoker and acknowledges that smoking exacerbates her bronchitis and asthma symptoms. She is currently taking Tessalon  pills for her cough.    Past Medical History:  Diagnosis Date   Asthma    BMI 50.0-59.9, adult (HCC) 06/26/2020   Bronchitis    Depression    Fibroids    PTSD (post-traumatic stress disorder)     Past Surgical History:  Procedure Laterality Date   MYOMECTOMY N/A 06/18/2021   Procedure: ABDOMINAL MYOMECTOMY;  Surgeon: Zina Jerilynn LABOR, MD;  Location: [facility] OR;  Service: Gynecology;  Laterality: N/A;   NO PAST SURGERIES      Family History   Problem Relation Age of Onset   Hypertension Mother    Hypertension Sister    Hypertension Maternal Grandmother     Social History   Socioeconomic History   Marital status: Single    Spouse name: Not on file   Number of children: 0   Years of education: Not on file   Highest education level: Some college, no degree  Occupational History   Occupation: home health care  Tobacco Use   Smoking status: Every Day    Current packs/day: 1.00    Average packs/day: 1 pack/day for 3.0 years (3.0 ttl pk-yrs)    Types: Cigarettes   Smokeless tobacco: Never  Vaping Use   Vaping status: Never Used  Substance and Sexual Activity   Alcohol use: Yes    Comment: 1-2 drinks 2x weekly   Drug use: Not Currently    Types: Marijuana    Comment: about once monthly   Sexual activity: Yes    Partners: Female  Other Topics Concern   Not on file  Social History Narrative   ** Merged History Encounter **       Social Drivers of Health   Financial Resource Strain: Medium Risk (12/21/2023)   Overall Financial Resource Strain (CARDIA)    Difficulty of Paying Living Expenses: Somewhat hard  Food Insecurity: No Food Insecurity (12/21/2023)   Hunger Vital Sign    Worried About Running Out  of Food in the Last Year: Never true    Ran Out of Food in the Last Year: Never true  Transportation Needs: No Transportation Needs (12/21/2023)   PRAPARE - Administrator, Civil Service (Medical): No    Lack of Transportation (Non-Medical): No  Physical Activity: Inactive (08/07/2022)   Exercise Vital Sign    Days of Exercise per Week: 0 days    Minutes of Exercise per Session: 0 min  Stress: Stress Concern Present (12/21/2023)   [employer] of Occupational Health - Occupational Stress Questionnaire    Feeling of Stress: Rather much  Social Connections: Socially Isolated (12/21/2023)   Social Connection and Isolation Panel    Frequency of Communication with Friends and Family: More than three  times a week    Frequency of Social Gatherings with Friends and Family: More than three times a week    Attends Religious Services: Never    Database administrator or Organizations: No    Attends Banker Meetings: Never    Marital Status: Never married    Allergies  Allergen Reactions   Cherry Itching and Swelling    Outpatient Medications Prior to Visit  Medication Sig Dispense Refill   albuterol  (PROVENTIL ) (2.5 MG/3ML) 0.083% nebulizer solution Take 3 mLs (2.5 mg total) by nebulization every 6 (six) hours as needed for wheezing or shortness of breath. 150 mL 1   albuterol  (VENTOLIN  HFA) 108 (90 Base) MCG/ACT inhaler Inhale 1-2 puffs into the lungs every 4 (four) hours as needed for wheezing or shortness of breath. 18 g 3   ascorbic acid  (VITAMIN C ) 500 MG tablet Take 1 tablet (500 mg total) by mouth daily. 90 tablet 2   benzonatate  (TESSALON ) 100 MG capsule Take 1 capsule (100 mg total) by mouth every 8 (eight) hours. 21 capsule 0   budesonide -formoterol  (SYMBICORT ) 160-4.5 MCG/ACT inhaler Inhale 2 puffs into the lungs 2 (two) times daily. 10.2 g 5   buPROPion  (WELLBUTRIN  XL) 150 MG 24 hr tablet Take 1 tablet (150 mg total) by mouth daily. 30 tablet 1   cetirizine  (ZYRTEC ) 10 MG tablet Take 1 tablet (10 mg total) by mouth every evening as needed for allergies. 30 tablet 2   Chlorphen-PE-Acetaminophen  (NOREL AD) 4-10-325 MG TABS Take 1 tablet by mouth 2 (two) times daily as needed for cough and congestion. 20 tablet 2   Cholecalciferol  (VITAMIN D3) 50 MCG (2000 UT) CAPS Take 1 capsule (2,000 Units total) by mouth in the morning. 30 capsule 5   FLUoxetine  (PROZAC ) 40 MG capsule Take 1 capsule (40 mg total) by mouth daily. 30 capsule 2   fluticasone  (FLONASE ) 50 MCG/ACT nasal spray Place 2 sprays into both nostrils every evening. 16 g 5   furosemide  (LASIX ) 40 MG tablet Take 1 tablet (40 mg total) by mouth daily as needed for leg swelling 30 tablet 5   losartan  (COZAAR ) 25 MG  tablet Take 1 tablet (25 mg total) by mouth daily. 90 tablet 0   Multiple Vitamins-Minerals (MULTIVITAMIN WITH MINERALS) tablet Take 1 tablet by mouth 3 (three) times a week.     nicotine  (NICODERM CQ  - DOSED IN MG/24 HOURS) 21 mg/24hr patch Place 1 patch (21 mg total) onto the skin daily. 28 patch 1   phentermine  (ADIPEX-P ) 37.5 MG tablet Take 1 tablet (37.5 mg total) by mouth in the morning. 30 tablet 2   phentermine  (ADIPEX-P ) 37.5 MG tablet Take 1 tablet (37.5 mg total) by mouth every morning. 30 tablet  2   Semaglutide -Weight Management (WEGOVY ) 0.25 MG/0.5ML SOAJ Inject 0.25 mg into the skin once a week. 4 mL 2   spironolactone  (ALDACTONE ) 100 MG tablet Take 1 tablet (100 mg total) by mouth daily. Take in the evening with dinner 30 tablet 3   SUMAtriptan  (IMITREX ) 50 MG tablet Take 1 tablet at the start of the headache.  May repeat in 2 hours if headache does not resolve.  Max 2 hours / 24-hour. 10 tablet 1   tretinoin  (RETIN-A ) 0.025 % cream Apply topically at bedtime. 45 g 3   Vitamin D , Ergocalciferol , 50000 units CAPS Take 1 capsule by mouth once a week. 4 capsule 5   zinc gluconate 50 MG tablet Take 50 mg by mouth daily.     predniSONE  (DELTASONE ) 50 MG tablet Take 1 tablet (50 mg total) by mouth daily. (Patient not taking: Reported on 01/05/2024) 3 tablet 0   No facility-administered medications prior to visit.     ROS Review of Systems  Constitutional:  Negative for activity change, appetite change, chills and fever.  HENT:  Negative for sinus pressure and sore throat.   Respiratory:  Positive for cough and wheezing. Negative for chest tightness and shortness of breath.   Cardiovascular:  Negative for chest pain and palpitations.  Gastrointestinal:  Negative for abdominal distention, abdominal pain and constipation.  Genitourinary: Negative.   Musculoskeletal: Negative.   Psychiatric/Behavioral:  Negative for behavioral problems and dysphoric mood.     Objective:  BP 118/82    Pulse (!) 103   Ht 5' 9 (1.753 m)   Wt (!) 369 lb 6.4 oz (167.6 kg)   LMP 12/12/2023 (Approximate)   SpO2 100%   BMI 54.55 kg/m      01/05/2024    3:17 PM 12/28/2023    3:05 PM 12/26/2023   10:20 AM  BP/Weight  Systolic BP 118 125 150  Diastolic BP 82 80 137  Wt. (Lbs) 369.4 369   BMI 54.55 kg/m2 54.49 kg/m2       Physical Exam Constitutional:      Appearance: She is well-developed.  HENT:     Right Ear: Tympanic membrane normal.     Left Ear: Tympanic membrane normal.     Mouth/Throat:     Mouth: Mucous membranes are moist.     Comments: Slight erythema Cardiovascular:     Rate and Rhythm: Tachycardia present.     Heart sounds: Normal heart sounds. No murmur heard. Pulmonary:     Effort: Pulmonary effort is normal.     Breath sounds: Normal breath sounds. No wheezing or rales.  Chest:     Chest wall: No tenderness.  Abdominal:     General: Bowel sounds are normal. There is no distension.     Palpations: Abdomen is soft. There is no mass.     Tenderness: There is no abdominal tenderness.  Musculoskeletal:        General: Normal range of motion.     Right lower leg: No edema.     Left lower leg: No edema.  Neurological:     Mental Status: She is alert and oriented to person, place, and time.  Psychiatric:        Mood and Affect: Mood normal.        Latest Ref Rng & Units 12/28/2023    3:59 PM 12/26/2023    5:54 AM 03/20/2023   12:44 PM  CMP  Glucose 70 - 99 mg/dL  855  890  BUN 6 - 20 mg/dL  5  11   Creatinine 9.55 - 1.00 mg/dL  9.21  9.18   Sodium 864 - 145 mmol/L  137  137   Potassium 3.5 - 5.1 mmol/L  3.4  4.0   Chloride 98 - 111 mmol/L  104  104   CO2 22 - 32 mmol/L  22  25   Calcium 8.9 - 10.3 mg/dL  8.8  8.8   Total Protein 6.0 - 8.5 g/dL 6.5     Total Bilirubin 0.0 - 1.2 mg/dL 0.5     Alkaline Phos 44 - 121 IU/L 80     AST 0 - 40 IU/L 12     ALT 0 - 32 IU/L 14       Lipid Panel     Component Value Date/Time   CHOL 210 (H) 12/28/2023  1559   TRIG 86 12/28/2023 1559   HDL 69 12/28/2023 1559   CHOLHDL 3.0 12/28/2023 1559   CHOLHDL 3.3 12/17/2022 1552   LDLCALC 126 (H) 12/28/2023 1559   LDLCALC 119 (H) 12/17/2022 1552    CBC    Component Value Date/Time   WBC 12.4 (H) 12/26/2023 0554   RBC 5.08 12/26/2023 0554   HGB 13.7 12/26/2023 0554   HGB 13.5 06/05/2019 1131   HCT 39.1 12/26/2023 0554   HCT 39.5 06/05/2019 1131   PLT 395 12/26/2023 0554   PLT 374 06/05/2019 1131   MCV 77.0 (L) 12/26/2023 0554   MCV 79 06/05/2019 1131   MCH 27.0 12/26/2023 0554   MCHC 35.0 12/26/2023 0554   RDW 15.0 12/26/2023 0554   RDW 15.0 06/05/2019 1131   LYMPHSABS 2.4 10/26/2022 1540   MONOABS 0.7 10/26/2022 1540   EOSABS 0.4 10/26/2022 1540   BASOSABS 0.0 10/26/2022 1540    Lab Results  Component Value Date   HGBA1C 5.6 06/05/2019       Assessment & Plan Asthma with acute bronchitis and productive cough Persistent cough and wheezing with productive phlegm, slightly improved with recent prednisone  and azithromycin . No fever, chest pain, or pneumonia. Avoid further prednisone  due to side effects. -CXR from 8/31 with no acute process - Prescribe alternative antibiotic - Augmentin  - Prescribe cough expectorant for mucus clearance - Tussionex - Limit albuterol  inhaler use to four times daily. - Monitor for fever or worsening symptoms and return if these occur.  Tobacco use Chronic smoking exacerbating bronchitis and asthma. - Encourage smoking cessation to improve asthma control.     No orders of the defined types were placed in this encounter.   Follow-up: No follow-ups on file.       Corrina Sabin, MD, FAAFP. [facility] [place], [place] [phone]   01/05/2024, 3:36 PM

## 2024-01-06 ENCOUNTER — Other Ambulatory Visit: Payer: Self-pay

## 2024-01-10 ENCOUNTER — Other Ambulatory Visit: Payer: Self-pay

## 2024-01-13 ENCOUNTER — Other Ambulatory Visit: Payer: Self-pay

## 2024-01-14 ENCOUNTER — Other Ambulatory Visit: Payer: Self-pay

## 2024-01-19 ENCOUNTER — Inpatient Hospital Stay (INDEPENDENT_AMBULATORY_CARE_PROVIDER_SITE_OTHER): Payer: Self-pay | Admitting: Primary Care

## 2024-01-26 ENCOUNTER — Ambulatory Visit: Payer: Self-pay

## 2024-01-26 NOTE — Telephone Encounter (Signed)
 FYI Only or Action Required?: FYI only for provider.  Patient was last seen in primary care on 01/05/2024 by Delbert Clam, MD.  Called Nurse Triage reporting Sore Throat.  Symptoms began yesterday.  Interventions attempted: Nothing.  Symptoms are: stable.  Triage Disposition: See Physician Within 24 Hours  Patient/caregiver understands and will follow disposition?: Yes Reason for Disposition  [1] Pus on tonsils (back of throat) AND [2] fever AND [3] swollen neck lymph nodes (glands)    No fever  Answer Assessment - Initial Assessment Questions Had bronchitis about a month ago, given Prednisone  and zpak, still having symptoms and was given Amoxicillin . Stated does not think she was taking it the way she was supposed to be, still has pills left. No appointments available in PCP office, mobile medicine clinic does not have location for the day. Advised UC, patient stated she will go but also wanted PCP opinion and see what she has to say. Please advise.   1. ONSET: When did the throat start hurting? (Hours or days ago)      This morning  2. SEVERITY: How bad is the sore throat? (Scale 1-10; mild, moderate or severe)     So swollen, feels like going to swallow tonsils  3.  VIRAL SYMPTOMS: Are there any symptoms of a cold, such as a runny nose, cough, hoarse voice or red eyes?      Cough, congestion  4. FEVER: Do you have a fever? If Yes, ask: What is your temperature, how was it measured, and when did it start?     No  5. PUS ON THE TONSILS: Is there pus on the tonsils in the back of your throat?     Does not appear to be, but it is red  6. OTHER SYMPTOMS: Do you have any other symptoms? (e.g., difficulty breathing, headache, rash)     Congestion, had bronchitis a month ago and does not feel like it completely cleared. Coughing up yellow mucous. Does hear some wheezing and crackling every now and then.  Protocols used: Sore Throat-A-AH  Copied from CRM  #[id number]. Topic: Clinical - Red Word Triage >> Jan 26, 2024  9:43 AM Joesph NOVAK wrote: Red Word that prompted transfer to Nurse Triage: Tonsils swollen(painful) , congestion for a month, coughing up mucus,

## 2024-01-26 NOTE — Telephone Encounter (Signed)
 Noted

## 2024-01-28 ENCOUNTER — Other Ambulatory Visit: Payer: Self-pay

## 2024-01-28 ENCOUNTER — Other Ambulatory Visit: Payer: Self-pay | Admitting: Internal Medicine

## 2024-01-30 MED ORDER — LOSARTAN POTASSIUM 25 MG PO TABS
25.0000 mg | ORAL_TABLET | Freq: Every day | ORAL | 1 refills | Status: AC
  Filled 2024-01-30: qty 90, 90d supply, fill #0
  Filled 2024-05-01: qty 90, 90d supply, fill #1

## 2024-01-31 ENCOUNTER — Other Ambulatory Visit: Payer: Self-pay

## 2024-02-01 ENCOUNTER — Encounter (HOSPITAL_COMMUNITY): Payer: Self-pay

## 2024-02-01 ENCOUNTER — Other Ambulatory Visit: Payer: Self-pay

## 2024-02-01 ENCOUNTER — Ambulatory Visit (HOSPITAL_COMMUNITY): Admitting: Clinical

## 2024-02-01 ENCOUNTER — Other Ambulatory Visit (HOSPITAL_COMMUNITY): Payer: Self-pay

## 2024-02-02 ENCOUNTER — Other Ambulatory Visit: Payer: Self-pay

## 2024-02-03 ENCOUNTER — Other Ambulatory Visit: Payer: Self-pay

## 2024-02-03 MED ORDER — ALBUTEROL SULFATE HFA 108 (90 BASE) MCG/ACT IN AERS
2.0000 | INHALATION_SPRAY | Freq: Four times a day (QID) | RESPIRATORY_TRACT | 5 refills | Status: AC | PRN
  Filled 2024-02-03: qty 6.7, 30d supply, fill #0
  Filled 2024-05-01: qty 6.7, 25d supply, fill #0

## 2024-02-08 ENCOUNTER — Ambulatory Visit: Payer: Self-pay | Admitting: Primary Care

## 2024-02-09 ENCOUNTER — Other Ambulatory Visit: Payer: Self-pay

## 2024-02-10 ENCOUNTER — Other Ambulatory Visit: Payer: Self-pay

## 2024-02-10 NOTE — Progress Notes (Signed)
 Patient did not connect for virtual psychiatric medication management appointment on 02/11/24 at 12PM. Sent secure video link with no response. Called phone with no answer; left VM with callback number. Due to number of no-shows, patient will need to present as walk-in for future therapy and medication management appointments.   LAURAINE DELENA PUMMEL, MD 02/11/24

## 2024-02-11 ENCOUNTER — Encounter (HOSPITAL_COMMUNITY): Payer: Self-pay | Admitting: Psychiatry

## 2024-02-11 ENCOUNTER — Encounter (HOSPITAL_COMMUNITY): Payer: Self-pay

## 2024-02-11 ENCOUNTER — Other Ambulatory Visit: Payer: Self-pay

## 2024-02-14 ENCOUNTER — Other Ambulatory Visit: Payer: Self-pay

## 2024-02-15 ENCOUNTER — Ambulatory Visit: Admitting: Dermatology

## 2024-02-22 ENCOUNTER — Other Ambulatory Visit: Payer: Self-pay

## 2024-02-23 ENCOUNTER — Other Ambulatory Visit: Payer: Self-pay

## 2024-02-23 ENCOUNTER — Other Ambulatory Visit (HOSPITAL_COMMUNITY): Payer: Self-pay

## 2024-03-29 ENCOUNTER — Other Ambulatory Visit: Payer: Self-pay

## 2024-03-30 ENCOUNTER — Ambulatory Visit: Payer: Self-pay | Admitting: Obstetrics and Gynecology

## 2024-04-05 ENCOUNTER — Other Ambulatory Visit: Payer: Self-pay

## 2024-04-06 ENCOUNTER — Telehealth: Payer: Self-pay | Admitting: Internal Medicine

## 2024-04-07 ENCOUNTER — Other Ambulatory Visit: Payer: Self-pay

## 2024-04-28 ENCOUNTER — Ambulatory Visit: Payer: Self-pay | Admitting: Internal Medicine

## 2024-05-01 ENCOUNTER — Other Ambulatory Visit: Payer: Self-pay

## 2024-05-10 ENCOUNTER — Other Ambulatory Visit: Payer: Self-pay

## 2024-05-15 ENCOUNTER — Other Ambulatory Visit: Payer: Self-pay

## 2024-05-16 ENCOUNTER — Other Ambulatory Visit: Payer: Self-pay
